# Patient Record
Sex: Male | Born: 1940 | Race: White | Hispanic: No | Marital: Married | Smoking: Former smoker
Health system: Southern US, Community
[De-identification: ages and names within clinical notes are randomized; demographics above are authoritative.]

## PROBLEM LIST (undated history)

## (undated) DIAGNOSIS — I219 Acute myocardial infarction, unspecified: Secondary | ICD-10-CM

## (undated) DIAGNOSIS — E119 Type 2 diabetes mellitus without complications: Secondary | ICD-10-CM

## (undated) DIAGNOSIS — J449 Chronic obstructive pulmonary disease, unspecified: Secondary | ICD-10-CM

## (undated) DIAGNOSIS — Z8719 Personal history of other diseases of the digestive system: Secondary | ICD-10-CM

## (undated) DIAGNOSIS — K219 Gastro-esophageal reflux disease without esophagitis: Secondary | ICD-10-CM

## (undated) DIAGNOSIS — I10 Essential (primary) hypertension: Secondary | ICD-10-CM

## (undated) DIAGNOSIS — I2510 Atherosclerotic heart disease of native coronary artery without angina pectoris: Secondary | ICD-10-CM

## (undated) DIAGNOSIS — I509 Heart failure, unspecified: Secondary | ICD-10-CM

## (undated) HISTORY — PX: CORONARY ANGIOPLASTY WITH STENT PLACEMENT: SHX49

## (undated) HISTORY — PX: HERNIA REPAIR: SHX51

---

## 2004-07-11 ENCOUNTER — Encounter: Payer: Self-pay | Admitting: Internal Medicine

## 2004-08-11 ENCOUNTER — Encounter: Payer: Self-pay | Admitting: Internal Medicine

## 2004-09-10 ENCOUNTER — Encounter: Payer: Self-pay | Admitting: Internal Medicine

## 2004-10-11 ENCOUNTER — Encounter: Payer: Self-pay | Admitting: Internal Medicine

## 2004-11-11 ENCOUNTER — Encounter: Payer: Self-pay | Admitting: Internal Medicine

## 2004-11-25 ENCOUNTER — Ambulatory Visit: Payer: Self-pay

## 2004-12-02 ENCOUNTER — Ambulatory Visit: Payer: Self-pay

## 2004-12-09 ENCOUNTER — Encounter: Payer: Self-pay | Admitting: Internal Medicine

## 2005-01-09 ENCOUNTER — Encounter: Payer: Self-pay | Admitting: Internal Medicine

## 2005-02-08 ENCOUNTER — Encounter: Payer: Self-pay | Admitting: Internal Medicine

## 2005-03-11 ENCOUNTER — Encounter: Payer: Self-pay | Admitting: Internal Medicine

## 2005-04-10 ENCOUNTER — Encounter: Payer: Self-pay | Admitting: Internal Medicine

## 2005-05-11 ENCOUNTER — Encounter: Payer: Self-pay | Admitting: Internal Medicine

## 2005-06-11 ENCOUNTER — Encounter: Payer: Self-pay | Admitting: Internal Medicine

## 2005-07-11 ENCOUNTER — Encounter: Payer: Self-pay | Admitting: Internal Medicine

## 2005-08-11 ENCOUNTER — Encounter: Payer: Self-pay | Admitting: Internal Medicine

## 2005-09-10 ENCOUNTER — Encounter: Payer: Self-pay | Admitting: Internal Medicine

## 2005-10-11 ENCOUNTER — Encounter: Payer: Self-pay | Admitting: Internal Medicine

## 2005-11-11 ENCOUNTER — Encounter: Payer: Self-pay | Admitting: Internal Medicine

## 2005-12-09 ENCOUNTER — Encounter: Payer: Self-pay | Admitting: Internal Medicine

## 2006-01-09 ENCOUNTER — Encounter: Payer: Self-pay | Admitting: Internal Medicine

## 2006-02-08 ENCOUNTER — Encounter: Payer: Self-pay | Admitting: Internal Medicine

## 2006-03-11 ENCOUNTER — Encounter: Payer: Self-pay | Admitting: Internal Medicine

## 2006-04-10 ENCOUNTER — Encounter: Payer: Self-pay | Admitting: Internal Medicine

## 2006-05-11 ENCOUNTER — Encounter: Payer: Self-pay | Admitting: Internal Medicine

## 2006-06-11 ENCOUNTER — Encounter: Payer: Self-pay | Admitting: Internal Medicine

## 2006-07-11 ENCOUNTER — Encounter: Payer: Self-pay | Admitting: Internal Medicine

## 2006-08-11 ENCOUNTER — Encounter: Payer: Self-pay | Admitting: Internal Medicine

## 2006-09-10 ENCOUNTER — Encounter: Payer: Self-pay | Admitting: Internal Medicine

## 2006-10-11 ENCOUNTER — Encounter: Payer: Self-pay | Admitting: Internal Medicine

## 2006-11-11 ENCOUNTER — Encounter: Payer: Self-pay | Admitting: Internal Medicine

## 2006-12-10 ENCOUNTER — Encounter: Payer: Self-pay | Admitting: Internal Medicine

## 2007-01-10 ENCOUNTER — Encounter: Payer: Self-pay | Admitting: Internal Medicine

## 2007-02-09 ENCOUNTER — Encounter: Payer: Self-pay | Admitting: Internal Medicine

## 2007-03-12 ENCOUNTER — Encounter: Payer: Self-pay | Admitting: Internal Medicine

## 2007-04-11 ENCOUNTER — Encounter: Payer: Self-pay | Admitting: Internal Medicine

## 2007-05-12 ENCOUNTER — Encounter: Payer: Self-pay | Admitting: Internal Medicine

## 2007-06-12 ENCOUNTER — Encounter: Payer: Self-pay | Admitting: Internal Medicine

## 2008-08-15 ENCOUNTER — Ambulatory Visit: Payer: Self-pay | Admitting: Internal Medicine

## 2011-03-26 ENCOUNTER — Ambulatory Visit: Payer: Self-pay | Admitting: Internal Medicine

## 2013-03-20 ENCOUNTER — Ambulatory Visit: Payer: Self-pay | Admitting: Specialist

## 2013-04-09 ENCOUNTER — Ambulatory Visit: Payer: Self-pay | Admitting: Specialist

## 2013-04-16 ENCOUNTER — Ambulatory Visit: Payer: Self-pay | Admitting: Specialist

## 2013-07-24 ENCOUNTER — Ambulatory Visit: Payer: Self-pay | Admitting: Urology

## 2013-10-29 ENCOUNTER — Ambulatory Visit: Payer: Self-pay | Admitting: Specialist

## 2014-05-09 ENCOUNTER — Ambulatory Visit: Payer: Self-pay | Admitting: Specialist

## 2015-01-24 ENCOUNTER — Other Ambulatory Visit: Payer: Self-pay | Admitting: Specialist

## 2015-01-24 DIAGNOSIS — R911 Solitary pulmonary nodule: Secondary | ICD-10-CM

## 2015-03-22 ENCOUNTER — Emergency Department: Payer: Medicare Other

## 2015-03-22 ENCOUNTER — Emergency Department
Admission: EM | Admit: 2015-03-22 | Discharge: 2015-03-22 | Disposition: A | Discharge status: Home / Self Care | Payer: Medicare Other | Attending: Emergency Medicine | Admitting: Emergency Medicine

## 2015-03-22 ENCOUNTER — Encounter: Payer: Self-pay | Admitting: Emergency Medicine

## 2015-03-22 DIAGNOSIS — E119 Type 2 diabetes mellitus without complications: Secondary | ICD-10-CM | POA: Diagnosis not present

## 2015-03-22 DIAGNOSIS — R079 Chest pain, unspecified: Secondary | ICD-10-CM | POA: Diagnosis present

## 2015-03-22 DIAGNOSIS — Z87891 Personal history of nicotine dependence: Secondary | ICD-10-CM | POA: Insufficient documentation

## 2015-03-22 HISTORY — DX: Atherosclerotic heart disease of native coronary artery without angina pectoris: I25.10

## 2015-03-22 HISTORY — DX: Chronic obstructive pulmonary disease, unspecified: J44.9

## 2015-03-22 HISTORY — DX: Type 2 diabetes mellitus without complications: E11.9

## 2015-03-22 HISTORY — DX: Acute myocardial infarction, unspecified: I21.9

## 2015-03-22 LAB — COMPREHENSIVE METABOLIC PANEL
ALBUMIN: 4.1 g/dL (ref 3.5–5.0)
ALK PHOS: 118 U/L (ref 38–126)
ALT: 20 U/L (ref 17–63)
ANION GAP: 12 (ref 5–15)
AST: 26 U/L (ref 15–41)
BUN: 21 mg/dL — ABNORMAL HIGH (ref 6–20)
CALCIUM: 9.2 mg/dL (ref 8.9–10.3)
CHLORIDE: 105 mmol/L (ref 101–111)
CO2: 26 mmol/L (ref 22–32)
CREATININE: 1.09 mg/dL (ref 0.61–1.24)
GFR calc non Af Amer: >60 mL/min (ref >60)
Glucose, Bld: 126 mg/dL — ABNORMAL HIGH (ref 65–99)
POTASSIUM: 3.4 mmol/L — AB (ref 3.5–5.1)
Sodium: 143 mmol/L (ref 135–145)
TOTAL PROTEIN: 7.5 g/dL (ref 6.5–8.1)
Total Bilirubin: 0.6 mg/dL (ref 0.3–1.2)

## 2015-03-22 LAB — CBC
HCT: 49 % (ref 40.0–52.0)
HEMOGLOBIN: 15.9 g/dL (ref 13.0–18.0)
MCH: 26.9 pg (ref 26.0–34.0)
MCHC: 32.4 g/dL (ref 32.0–36.0)
MCV: 82.8 fL (ref 80.0–100.0)
Platelets: 219 10*3/uL (ref 150–440)
RBC: 5.92 MIL/uL — AB (ref 4.40–5.90)
RDW: 14.6 % — AB (ref 11.5–14.5)
WBC: 17.6 10*3/uL — ABNORMAL HIGH (ref 3.8–10.6)

## 2015-03-22 LAB — TROPONIN I: Troponin I: <0.03 ng/mL (ref <0.031)

## 2015-03-22 LAB — FIBRIN DERIVATIVES D-DIMER (ARMC ONLY): FIBRIN DERIVATIVES D-DIMER (ARMC): 819 — AB (ref 0–499)

## 2015-03-22 MED ORDER — IOHEXOL 350 MG/ML SOLN
100.0000 mL | Freq: Once | INTRAVENOUS | Status: AC | PRN
  Administered 2015-03-22: 100 mL via INTRAVENOUS

## 2015-03-22 MED ORDER — ASPIRIN 81 MG PO CHEW: 324.0000 mg | CHEWABLE_TABLET | Freq: Once | ORAL | Status: DC

## 2015-03-22 MED ORDER — ASPIRIN 81 MG PO CHEW
CHEWABLE_TABLET | ORAL | Status: AC
  Filled 2015-03-22: qty 4

## 2015-03-22 NOTE — ED Notes (Signed)
Report to [employer], rn.

## 2015-03-22 NOTE — Consult Note (Signed)
[facility] - [place] [facility]   PATIENT NAME: Jose Ray    MR#:  [id number]  DATE OF BIRTH:  Nov 30, 1940  DATE OF ADMISSION:  03/22/2015  PRIMARY CARE PHYSICIAN: Dr Burnadette Pop  REQUESTING/REFERRING PHYSICIAN: Governor Rooks  CHIEF COMPLAINT:   Burning chest pain  HISTORY OF PRESENT ILLNESS:  Jose Ray  is a 74 y.o. male with a known history of coronary artery disease. He woke up at 2 AM felt like he had a real bad heartburn he took some comes and discomfort was still going on. He had a similar episode back in 2002 where he had a heart attack. He felt queasy started having dry heaves. They took his blood pressure was normal. He had no sweating initially but did have some sweating here in the ER. The pain was described as a burning pain 4 out of 10 intensity lasted about one hour he is pain-free now and feels okay. 10 days ago Dr. Gwen Pounds took him off his Plavix. He states that he has had disc decreased exercise capacity. Yesterday, so a loss of energy and the had to take a nap. He is not sweats slept well in the past 3 days.  In the ER he had a CT scan of the chest that was negative for pulmonary embolism first cardiac enzyme is negative.  PAST MEDICAL HISTORY:   Past Medical History  Diagnosis Date  . Diabetes mellitus without complication   . Coronary artery disease   . COPD (chronic obstructive pulmonary disease)   . Acute MI     PAST SURGICAL HISTOIRY:   Past Surgical History  Procedure Laterality Date  . Coronary angioplasty with stent placement    . Hernia repair N/A     SOCIAL HISTORY:   History  Substance Use Topics  . Smoking status: Former Games developer  . Smokeless tobacco: Never Used  . Alcohol Use: No    FAMILY HISTORY:   Family History  Problem Relation Age of Onset  . Stomach cancer Mother   . Coronary artery disease Father     DRUG ALLERGIES:   Allergies  Allergen Reactions  . Shrimp [Shellfish Allergy]  Anaphylaxis  . Penicillins Other (See Comments)    Patient reports feeling faint when given    REVIEW OF SYSTEMS:  CONSTITUTIONAL: No fever. Slight sweating and ER. Positive for weight loss. Positive for fatigue and decreased exercise capacity.  EYES: No blurred or double vision. Wears reading glasses EARS, NOSE, AND THROAT: No tinnitus or ear pain.  RESPIRATORY: No cough, shortness of breath, wheezing or hemoptysis.  CARDIOVASCULAR: Positive for burning chest pain, no orthopnea, edema.  GASTROINTESTINAL: No  abdominal pain. No blood in bowel movements. Positive for dry heaves. Has loose stools. GENITOURINARY: No dysuria, hematuria.  ENDOCRINE: No polyuria, nocturia,  HEMATOLOGY: No anemia, easy bruising or bleeding SKIN: No rash or lesion. MUSCULOSKELETAL: Positive for joint pains of sitting down for long period of time  NEUROLOGIC: No tingling, numbness, weakness. Syncopal episode back in January PSYCHIATRY: No anxiety or depression.   MEDICATIONS AT HOME:   Prior to Admission medications   Medication Sig Start Date End Date Taking? Authorizing Provider  aspirin 81 MG tablet Take 81 mg by mouth daily.   Yes Historical Provider, MD  atorvastatin (LIPITOR) 80 MG tablet Take 80 mg by mouth daily.   Yes Historical Provider, MD  benzonatate (TESSALON) 200 MG capsule Take 200 mg by mouth 3 (three) times daily as needed for cough.  Yes Historical Provider, MD  fexofenadine (ALLEGRA) 180 MG tablet Take 180 mg by mouth daily.   Yes Historical Provider, MD  fluticasone (VERAMYST) 27.5 MCG/SPRAY nasal spray Place 1 spray into the nose daily.   Yes Historical Provider, MD  Fluticasone-Salmeterol (ADVAIR) 250-50 MCG/DOSE AEPB Inhale 1 puff into the lungs 2 (two) times daily.   Yes Historical Provider, MD  hydrocortisone (ANUSOL-HC) 25 MG suppository Place 25 mg rectally 2 (two) times daily as needed for hemorrhoids or itching.   Yes Historical Provider, MD  losartan-hydrochlorothiazide (HYZAAR)  100-25 MG per tablet Take 1 tablet by mouth daily.   Yes Historical Provider, MD  metFORMIN (GLUCOPHAGE) 500 MG tablet Take by mouth 2 (two) times daily with a meal.   Yes Historical Provider, MD  metoprolol succinate (TOPROL-XL) 100 MG 24 hr tablet Take 50 mg by mouth daily. Take with or immediately following a meal.   Yes Historical Provider, MD  nitroGLYCERIN (NITROSTAT) 0.4 MG SL tablet Place 0.4 mg under the tongue every 5 (five) minutes as needed for chest pain.   Yes Historical Provider, MD  OXYGEN Inhale 2.5-3 L into the lungs continuous. Used while sleeping and during exercise   Yes Historical Provider, MD  pantoprazole (PROTONIX) 40 MG tablet Take 40 mg by mouth daily.   Yes Historical Provider, MD  Polyethyl Glycol-Propyl Glycol (SYSTANE) 0.4-0.3 % SOLN Apply 1 drop to eye 4 (four) times daily as needed (dry eyes).   Yes Historical Provider, MD  tamsulosin (FLOMAX) 0.4 MG CAPS capsule Take 0.4 mg by mouth.   Yes Historical Provider, MD  tiotropium (SPIRIVA) 18 MCG inhalation capsule Place 18 mcg into inhaler and inhale daily.   Yes Historical Provider, MD      VITAL SIGNS:  Blood pressure 160/90, pulse 89, temperature 97.4 F (36.3 C), temperature source Oral, resp. rate 16, height 5\' 11"  (1.803 m), weight 102.967 kg (227 lb), SpO2 98 %.  PHYSICAL EXAMINATION:  GENERAL:  74 y.o.-year-old patient lying in the bed with no acute distress.  EYES: Pupils equal, round, reactive to light and accommodation. No scleral icterus. Extraocular muscles intact.  HEENT: Head atraumatic, normocephalic. Oropharynx and nasopharynx clear.  NECK:  Supple, no jugular venous distention. No thyroid enlargement, no tenderness.  LUNGS: Normal breath sounds bilaterally, no wheezing, rales,rhonchi or crepitation. No use of accessory muscles of respiration.  CARDIOVASCULAR: S1, S2 normal. No murmurs, rubs, or gallops.  ABDOMEN: Soft, nontender, nondistended. Bowel sounds present. No organomegaly or mass.   EXTREMITIES: No pedal edema, cyanosis, or clubbing.  NEUROLOGIC: Cranial nerves II through XII are intact. Muscle strength 5/5 in all extremities. Sensation intact. Gait not checked.  PSYCHIATRIC: The patient is alert and oriented x 3.  SKIN: No obvious rash, lesion, or ulcer.   LABORATORY PANEL:   CBC  Recent Labs Lab 03/22/15 0327  WBC 17.6*  HGB 15.9  HCT 49.0  PLT 219    Chemistries   Recent Labs Lab 03/22/15 0327  NA 143  K 3.4*  CL 105  CO2 26  GLUCOSE 126*  BUN 21*  CREATININE 1.09  CALCIUM 9.2  AST 26  ALT 20  ALKPHOS 118  BILITOT 0.6    Cardiac Enzymes  Recent Labs Lab 03/22/15 0327  TROPONINI <0.03    RADIOLOGY:  Ct Angio Chest Pe W/cm &/or Wo Cm  03/22/2015   CLINICAL DATA:  Chest pain and shortness of Breath  EXAM: CT ANGIOGRAPHY CHEST WITH CONTRAST  TECHNIQUE: Multidetector CT imaging of the chest was  performed using the standard protocol during bolus administration of intravenous contrast. Multiplanar CT image reconstructions and MIPs were obtained to evaluate the vascular anatomy.  CONTRAST:  OMNIPAQUE IOHEXOL 350 MG/ML SOLN  COMPARISON:  05/09/2014  FINDINGS: The lungs are well aerated bilaterally but demonstrate diffuse emphysematous changes similar to that seen on the prior exam. Some acute on chronic changes are seen in the bases bilaterally. A left upper lobe nodule is again seen and enlarged by less than 1 mm measuring between 12 and 13 mm. The difference may be related to the thickness of the image slices.  The thoracic inlet is within normal limits. Aortic calcifications are seen without aneurysmal dilatation or dissection. The pulmonary artery is well visualized and demonstrates a normal branching pattern. No filling defects to suggest pulmonary emboli are seen. Moderate coronary calcifications are seen.  Scanning into the upper abdomen reveals no acute abnormality.  Review of the MIP images confirms the above findings.  IMPRESSION: No  evidence of pulmonary emboli.  Bibasilar changes likely representing acute on chronic atelectasis.  Relatively stable left upper lobe nodule. The difference in size may be related to the imaging technique.   Electronically Signed   By: Alcide Clever M.D.   On: 03/22/2015 08:34   Dg Chest Port 1 View  03/22/2015   CLINICAL DATA:  Chest pain, onset tonight.  EXAM: PORTABLE CHEST - 1 VIEW  COMPARISON:  CT 05/09/2014  FINDINGS: The left upper lobe pulmonary nodule is grossly unchanged from the 05/09/2014 CT. There are severe upper lobe emphysematous changes. There is basilar opacity bilaterally, left greater than right. Some of this basilar opacity appears to be chronic but there could be superimposed infectious infiltrate or atelectasis in the left base. There is no pneumothorax. No large effusion. Heart size is unremarkable and unchanged.  IMPRESSION: Severe emphysematous changes, with left base opacity which could represent superimposed infiltrate or atelectasis.  Known left upper lobe pulmonary nodule, due for a follow-up CT in July or August.   Electronically Signed   By: Ellery Plunk M.D.   On: 03/22/2015 04:01    EKG:   Normal sinus rhythm 94 bpm  IMPRESSION AND PLAN:   1. Atypical chest pain. History of coronary artery disease. Patient had a negative CAT scan here in the emergency room. Patient had a recent stress test as per cardiology note this was okay. I advised a second cardiac enzymes here in the emergency room and then discharge if that is negative. Follow-up with Dr. Gwen Pounds as outpatient. The patient's pain sounds more GI in nature patient is already on Protonix. The patient does have a hiatal hernia which could be contributing to the pain. 2. hyperlipidemia unspecified-continue atorvastatin. 3. essential hypertension -continue usual medications. 4. type 2 diabetes -continue usual medications. 5. Leukocytosis- likely secondary to dry heaving. Follow-up as outpatient.  All the  records are reviewed and case discussed with Consulting provider. Management plans discussed with the patient, family and they are in agreement.  CODE STATUS: Full  TOTAL TIME TAKING CARE OF THIS PATIENT: 55 minutes.    Alford Highland M.D on 03/22/2015 at 10:27 AM  Between 7am to 6pm - Pager - [phone]  After 6pm go to [email or web address] - password [email or web address] [facility]  [facility]  Office  [phone]  CC: Primary care Physician: Dr. Burnadette Pop

## 2015-03-22 NOTE — ED Notes (Signed)
MD at bedside. 

## 2015-03-22 NOTE — ED Provider Notes (Signed)
[facility] Emergency Department Provider Note  ____________________________________________  Time seen: On arrival, EMS  I have reviewed the triage vital signs and the nursing notes.   HISTORY  Chief Complaint Chest Pain      HPI Jose Ray is a 74 y.o. male who presents with chest pain. Patient reports he woke up approximately 1-2 hours ago with a burning sensation in his chest, he took some tums which did not help so his wife called EMS. He has a history of a heart attack several years ago which felt similar and required 2 stents. He has been on aspirin and Plavix but recently his physician just stopped Plavix 1 week ago. He reports travel to [place] one month ago but denies calf pain and denies shortness of breath although he wears oxygen at night for COPD. No cough, no fever, no chills.     Past Medical History  Diagnosis Date  . Diabetes mellitus without complication   . Coronary artery disease   . COPD (chronic obstructive pulmonary disease)   . Acute MI     There are no active problems to display for this patient.   Past Surgical History  Procedure Laterality Date  . Coronary angioplasty with stent placement      No current outpatient prescriptions on file.  Allergies Shrimp  History reviewed. No pertinent family history.  Social History History  Substance Use Topics  . Smoking status: Former Games developer  . Smokeless tobacco: Never Used  . Alcohol Use: No    Review of Systems  Constitutional: Negative for fever. Eyes: Negative for visual changes. ENT: Negative for sore throat Cardiovascular: Positive for chest pain Respiratory: Negative for shortness of breath. Gastrointestinal: Negative for abdominal pain, vomiting and diarrhea. Genitourinary: Negative for dysuria. Musculoskeletal: Negative for back pain. Skin: Negative for rash. Neurological: Negative for headaches or focal weakness   10-point ROS otherwise  negative.  ____________________________________________   PHYSICAL EXAM:  VITAL SIGNS: ED Triage Vitals  Enc Vitals Group     BP 03/22/15 0325 175/86 mmHg     Pulse Rate 03/22/15 0325 94     Resp 03/22/15 0325 22     Temp 03/22/15 0325 97.4 F (36.3 C)     Temp Source 03/22/15 0325 Oral     SpO2 03/22/15 0325 92 %     Weight 03/22/15 0325 227 lb (102.967 kg)     Height 03/22/15 0325 5\' 11"  (1.803 m)     Head Cir --      Peak Flow --      Pain Score 03/22/15 0328 2     Pain Loc --      Pain Edu? --      Excl. in GC? --      Constitutional: Alert and oriented. Well appearing and in no distress. Eyes: Conjunctivae are normal. PERRL. ENT   Head: Normocephalic and atraumatic.   Nose: No rhinnorhea.   Mouth/Throat: Mucous membranes are moist. Cardiovascular: Normal rate, regular rhythm. Normal and symmetric distal pulses are present in all extremities. No murmurs, rubs, or gallops. Respiratory: Normal respiratory effort without tachypnea nor retractions. Breath sounds are clear and equal bilaterally.  Gastrointestinal: Soft and non-tender in all quadrants. No distention. There is no CVA tenderness. Genitourinary: deferred Musculoskeletal: Nontender with normal range of motion in all extremities. No lower extremity tenderness nor edema. Neurologic:  Normal speech and language. No gross focal neurologic deficits are appreciated. Skin:  Skin is warm, dry and intact. No rash  noted. Psychiatric: Mood and affect are normal. Patient exhibits appropriate insight and judgment.  ____________________________________________    LABS (pertinent positives/negatives)  Labs Reviewed  CBC - Abnormal; Notable for the following:    WBC 17.6 (*)    RBC 5.92 (*)    RDW 14.6 (*)    All other components within normal limits  COMPREHENSIVE METABOLIC PANEL - Abnormal; Notable for the following:    Potassium 3.4 (*)    Glucose, Bld 126 (*)    BUN 21 (*)    All other components  within normal limits  FIBRIN DERIVATIVES D-DIMER ([facility] ONLY) - Abnormal; Notable for the following:    Fibrin derivatives D-dimer ([facility]) 819 (*)    All other components within normal limits  TROPONIN I    ____________________________________________   EKG  ED ECG REPORT I, Jene Every, the attending physician, personally viewed and interpreted this ECG.   Date: 03/22/2015  EKG Time: 3:24 AM  Rate: 91  Rhythm: normal sinus rhythm  Axis: Normal  Intervals:none  ST&T Change: Old anteroseptal infarct   ____________________________________________    RADIOLOGY  Chest x-ray with atelectasis versus pneumonia  ____________________________________________   PROCEDURES  Procedure(s) performed: none  Critical Care performed: none  ____________________________________________   INITIAL IMPRESSION / ASSESSMENT AND PLAN / ED COURSE  Pertinent labs & imaging results that were available during my care of the patient were reviewed by me and considered in my medical decision making (see chart for details).  Initial chest x-ray concerning for pneumonia versus atelectasis. Given high white count and elevated d-dimer I will obtain a CT chest to evaluate for both PE and possible pneumonia. Patient comfortable and pain-free at this time.  ____________________________________________ Patient signed out to Dr. Shaune Pollack, to follow-up on CT angiogram. Patient will likely require admission  FINAL CLINICAL IMPRESSION(S) / ED DIAGNOSES  Final diagnoses:  Chest pain, unspecified chest pain type     Jene Every, MD 03/22/15 [phone]

## 2015-03-22 NOTE — ED Notes (Signed)
Pt had 324mg  asa chewed with ems and 1 inch of ntg paste to left chest.

## 2015-03-22 NOTE — ED Notes (Signed)
Pt states awoke from sleep with mid sternal chest pressure and pain. Pt states" i thought is was heartburn so i took some tums and it didn't help so i called 911". Pt diaphoretic, nauseated. Pt with previous mi in 2002 pt states "this feels like the heart attack i had the last time."

## 2015-03-22 NOTE — ED Notes (Signed)
Patient took 81mg  ASA last night. Patient took 81mg  x2 this am at 02:00. Ems administered another 81mg  ASA en route. Discussed with ED MD. Verbal order to dc ASA.

## 2015-03-22 NOTE — ED Provider Notes (Addendum)
[facility]  I accepted care from Dr. Cyril Loosen  ____________________________________________    RADIOLOGY IMPRESSION: No evidence of pulmonary emboli.  Bibasilar changes likely representing acute on chronic atelectasis.  Relatively stable left upper lobe nodule. The difference in size may be related to the imaging technique.  ____________________________________________ Raina Mina, MD, the attending physician have personally viewed and interpreted this ECG.   80 bpm. Normal sinus rhythm. Normal axis. Q waves septally. Normal ST and T-wave.  PROCEDURES  Procedure(s) performed: None  Critical Care performed: None  ____________________________________________   INITIAL IMPRESSION / ASSESSMENT AND PLAN / ED COURSE  Pertinent labs & imaging results that were available during my care of the patient were reviewed by me and considered in my medical decision making (see chart for details).  Patient's CT is negative for PE or pneumonia. Unclear what his elevated white blood cell count of 17 is from. Regarding his chest pain, he has had blockages before and is high risk given that he was recently taken off his Plavix. I recommended observation admission due to chest pain. Currently he has no chest pain. he did get aspirin prior to arrival to a total of 324 mg over the preceding 12 hours.  I discussed with the hospitalist and the family for admission.  Patient is Medicare and family was questioned whether or not they would pay for full observation status which was confirmed with our care manager. Patient was seen by our hospitalist doctor Fonnie Birkenhead who discussed with the patient about sending a repeat troponin and if negative discharging home to follow up with his cardiologist on Monday. Patient did have a recent negative stress test in January and this is apparently the reason why he was taken off of his Plavix. Most likely source of his discomfort today was  probably GI related.  Richard Shaune Pascal was negative.  Discharge instructions and return precautions were discussed with patient and family and they're comfortable with this plan.  ____________________________________________   FINAL CLINICAL IMPRESSION(S) / ED DIAGNOSES  Final diagnoses:  Chest pain, unspecified chest pain type      Governor Rooks, MD 03/22/15 [id number]  Governor Rooks, MD 03/22/15 1019  Governor Rooks, MD 03/22/15 1309

## 2015-03-22 NOTE — ED Notes (Signed)
Pt updated on ct process. Pt denies pain at this time. ntg paste removed from chest due to blood pressure.

## 2015-03-22 NOTE — Discharge Instructions (Signed)
Return to the emergency room for any new or worsening chest pain, jaw pain, trouble breathing, shortness breath, weakness, numbness, nausea, sweating, or any other symptoms concerning to you.  Chest Pain (Nonspecific) It is often hard to give a specific diagnosis for the cause of chest pain. There is always a chance that your pain could be related to something serious, such as a heart attack or a blood clot in the lungs. You need to follow up with your health care provider for further evaluation. CAUSES   Heartburn.  Pneumonia or bronchitis.  Anxiety or stress.  Inflammation around your heart (pericarditis) or lung (pleuritis or pleurisy).  A blood clot in the lung.  A collapsed lung (pneumothorax). It can develop suddenly on its own (spontaneous pneumothorax) or from trauma to the chest.  Shingles infection (herpes zoster virus). The chest wall is composed of bones, muscles, and cartilage. Any of these can be the source of the pain.  The bones can be bruised by injury.  The muscles or cartilage can be strained by coughing or overwork.  The cartilage can be affected by inflammation and become sore (costochondritis). DIAGNOSIS  Lab tests or other studies may be needed to find the cause of your pain. Your health care provider may have you take a test called an ambulatory electrocardiogram (ECG). An ECG records your heartbeat patterns over a 24-hour period. You may also have other tests, such as:  Transthoracic echocardiogram (TTE). During echocardiography, sound waves are used to evaluate how blood flows through your heart.  Transesophageal echocardiogram (TEE).  Cardiac monitoring. This allows your health care provider to monitor your heart rate and rhythm in real time.  Holter monitor. This is a portable device that records your heartbeat and can help diagnose heart arrhythmias. It allows your health care provider to track your heart activity for several days, if needed.  Stress  tests by exercise or by giving medicine that makes the heart beat faster. TREATMENT   Treatment depends on what may be causing your chest pain. Treatment may include:  Acid blockers for heartburn.  Anti-inflammatory medicine.  Pain medicine for inflammatory conditions.  Antibiotics if an infection is present.  You may be advised to change lifestyle habits. This includes stopping smoking and avoiding alcohol, caffeine, and chocolate.  You may be advised to keep your head raised (elevated) when sleeping. This reduces the chance of acid going backward from your stomach into your esophagus. Most of the time, nonspecific chest pain will improve within 2-3 days with rest and mild pain medicine.  HOME CARE INSTRUCTIONS   If antibiotics were prescribed, take them as directed. Finish them even if you start to feel better.  For the next few days, avoid physical activities that bring on chest pain. Continue physical activities as directed.  Do not use any tobacco products, including cigarettes, chewing tobacco, or electronic cigarettes.  Avoid drinking alcohol.  Only take medicine as directed by your health care provider.  Follow your health care provider's suggestions for further testing if your chest pain does not go away.  Keep any follow-up appointments you made. If you do not go to an appointment, you could develop lasting (chronic) problems with pain. If there is any problem keeping an appointment, call to reschedule. SEEK MEDICAL CARE IF:   Your chest pain does not go away, even after treatment.  You have a rash with blisters on your chest.  You have a fever. SEEK IMMEDIATE MEDICAL CARE IF:   You have  increased chest pain or pain that spreads to your arm, neck, jaw, back, or abdomen.  You have shortness of breath.  You have an increasing cough, or you cough up blood.  You have severe back or abdominal pain.  You feel nauseous or vomit.  You have severe weakness.  You  faint.  You have chills. This is an emergency. Do not wait to see if the pain will go away. Get medical help at once. Call your local emergency services (911 in [place] Do not drive yourself to the hospital. MAKE SURE YOU:   Understand these instructions.  Will watch your condition.  Will get help right away if you are not doing well or get worse. Document Released: 07/07/2005 Document Revised: 10/02/2013 Document Reviewed: 05/02/2008 [facility] Patient Information 2015 [place], [place]. This information is not intended to replace advice given to you by your health care provider. Make sure you discuss any questions you have with your health care provider.

## 2015-04-25 ENCOUNTER — Ambulatory Visit: Payer: BLUE CROSS/BLUE SHIELD

## 2015-04-29 ENCOUNTER — Other Ambulatory Visit: Payer: BLUE CROSS/BLUE SHIELD

## 2016-10-24 ENCOUNTER — Inpatient Hospital Stay
Admission: EM | Admit: 2016-10-24 | Discharge: 2016-10-26 | DRG: 089 | Disposition: A | Discharge status: Home / Self Care | Payer: Medicare Other | Attending: Specialist | Admitting: Specialist

## 2016-10-24 ENCOUNTER — Emergency Department: Payer: Medicare Other

## 2016-10-24 DIAGNOSIS — Z880 Allergy status to penicillin: Secondary | ICD-10-CM | POA: Diagnosis not present

## 2016-10-24 DIAGNOSIS — I252 Old myocardial infarction: Secondary | ICD-10-CM | POA: Diagnosis not present

## 2016-10-24 DIAGNOSIS — Z87891 Personal history of nicotine dependence: Secondary | ICD-10-CM

## 2016-10-24 DIAGNOSIS — E785 Hyperlipidemia, unspecified: Secondary | ICD-10-CM | POA: Diagnosis present

## 2016-10-24 DIAGNOSIS — S060X1A Concussion with loss of consciousness of 30 minutes or less, initial encounter: Principal | ICD-10-CM | POA: Diagnosis present

## 2016-10-24 DIAGNOSIS — Z951 Presence of aortocoronary bypass graft: Secondary | ICD-10-CM

## 2016-10-24 DIAGNOSIS — J449 Chronic obstructive pulmonary disease, unspecified: Secondary | ICD-10-CM | POA: Diagnosis present

## 2016-10-24 DIAGNOSIS — I2510 Atherosclerotic heart disease of native coronary artery without angina pectoris: Secondary | ICD-10-CM | POA: Diagnosis present

## 2016-10-24 DIAGNOSIS — R55 Syncope and collapse: Secondary | ICD-10-CM | POA: Diagnosis not present

## 2016-10-24 DIAGNOSIS — N400 Enlarged prostate without lower urinary tract symptoms: Secondary | ICD-10-CM | POA: Diagnosis present

## 2016-10-24 DIAGNOSIS — I10 Essential (primary) hypertension: Secondary | ICD-10-CM | POA: Diagnosis present

## 2016-10-24 DIAGNOSIS — S0083XA Contusion of other part of head, initial encounter: Secondary | ICD-10-CM | POA: Diagnosis present

## 2016-10-24 DIAGNOSIS — I5022 Chronic systolic (congestive) heart failure: Secondary | ICD-10-CM | POA: Diagnosis present

## 2016-10-24 DIAGNOSIS — Z91013 Allergy to seafood: Secondary | ICD-10-CM | POA: Diagnosis not present

## 2016-10-24 DIAGNOSIS — Z955 Presence of coronary angioplasty implant and graft: Secondary | ICD-10-CM

## 2016-10-24 DIAGNOSIS — W19XXXA Unspecified fall, initial encounter: Secondary | ICD-10-CM | POA: Diagnosis present

## 2016-10-24 DIAGNOSIS — I110 Hypertensive heart disease with heart failure: Secondary | ICD-10-CM | POA: Diagnosis present

## 2016-10-24 DIAGNOSIS — E1151 Type 2 diabetes mellitus with diabetic peripheral angiopathy without gangrene: Secondary | ICD-10-CM | POA: Diagnosis present

## 2016-10-24 DIAGNOSIS — Z79899 Other long term (current) drug therapy: Secondary | ICD-10-CM | POA: Diagnosis not present

## 2016-10-24 DIAGNOSIS — E119 Type 2 diabetes mellitus without complications: Secondary | ICD-10-CM

## 2016-10-24 DIAGNOSIS — I5020 Unspecified systolic (congestive) heart failure: Secondary | ICD-10-CM

## 2016-10-24 LAB — CBC
HCT: 45.3 % (ref 40.0–52.0)
Hemoglobin: 15.5 g/dL (ref 13.0–18.0)
MCH: 28 pg (ref 26.0–34.0)
MCHC: 34.2 g/dL (ref 32.0–36.0)
MCV: 81.9 fL (ref 80.0–100.0)
PLATELETS: 199 10*3/uL (ref 150–440)
RBC: 5.53 MIL/uL (ref 4.40–5.90)
RDW: 15 % — AB (ref 11.5–14.5)
WBC: 12.6 10*3/uL — AB (ref 3.8–10.6)

## 2016-10-24 LAB — URINALYSIS, COMPLETE (UACMP) WITH MICROSCOPIC
Bacteria, UA: NONE SEEN
Bilirubin Urine: NEGATIVE
GLUCOSE, UA: NEGATIVE mg/dL
Hgb urine dipstick: NEGATIVE
KETONES UR: NEGATIVE mg/dL
Leukocytes, UA: NEGATIVE
NITRITE: NEGATIVE
PH: 8 (ref 5.0–8.0)
PROTEIN: NEGATIVE mg/dL
Specific Gravity, Urine: 1.01 (ref 1.005–1.030)
Squamous Epithelial / LPF: NONE SEEN
WBC, UA: NONE SEEN WBC/hpf (ref 0–5)

## 2016-10-24 LAB — BASIC METABOLIC PANEL
Anion gap: 9 (ref 5–15)
BUN: 19 mg/dL (ref 6–20)
CALCIUM: 9.5 mg/dL (ref 8.9–10.3)
CHLORIDE: 101 mmol/L (ref 101–111)
CO2: 26 mmol/L (ref 22–32)
CREATININE: 0.84 mg/dL (ref 0.61–1.24)
Glucose, Bld: 168 mg/dL — ABNORMAL HIGH (ref 65–99)
Potassium: 3.9 mmol/L (ref 3.5–5.1)
SODIUM: 136 mmol/L (ref 135–145)

## 2016-10-24 LAB — GLUCOSE, CAPILLARY: Glucose-Capillary: 149 mg/dL — ABNORMAL HIGH (ref 65–99)

## 2016-10-24 LAB — TROPONIN I: Troponin I: <0.03 ng/mL (ref <0.03)

## 2016-10-24 MED ORDER — PANTOPRAZOLE SODIUM 40 MG PO TBEC
40.0000 mg | DELAYED_RELEASE_TABLET | Freq: Every day | ORAL | Status: DC
  Administered 2016-10-25 – 2016-10-26 (×2): 40 mg via ORAL
  Filled 2016-10-24 (×2): qty 1

## 2016-10-24 MED ORDER — ONDANSETRON HCL 4 MG/2ML IJ SOLN: 4.0000 mg | Freq: Four times a day (QID) | INTRAMUSCULAR | Status: DC | PRN

## 2016-10-24 MED ORDER — MOMETASONE FURO-FORMOTEROL FUM 200-5 MCG/ACT IN AERO
2.0000 | INHALATION_SPRAY | Freq: Two times a day (BID) | RESPIRATORY_TRACT | Status: DC
  Administered 2016-10-25 – 2016-10-26 (×4): 2 via RESPIRATORY_TRACT
  Filled 2016-10-24: qty 8.8

## 2016-10-24 MED ORDER — TAMSULOSIN HCL 0.4 MG PO CAPS
0.4000 mg | ORAL_CAPSULE | Freq: Every day | ORAL | Status: DC
  Filled 2016-10-24: qty 1

## 2016-10-24 MED ORDER — ONDANSETRON HCL 4 MG PO TABS: 4.0000 mg | ORAL_TABLET | Freq: Four times a day (QID) | ORAL | Status: DC | PRN

## 2016-10-24 MED ORDER — ASPIRIN EC 81 MG PO TBEC
81.0000 mg | DELAYED_RELEASE_TABLET | Freq: Every day | ORAL | Status: DC
  Administered 2016-10-25 – 2016-10-26 (×2): 81 mg via ORAL
  Filled 2016-10-24 (×2): qty 1

## 2016-10-24 MED ORDER — ATORVASTATIN CALCIUM 20 MG PO TABS
80.0000 mg | ORAL_TABLET | Freq: Every day | ORAL | Status: DC
  Administered 2016-10-25 – 2016-10-26 (×2): 80 mg via ORAL
  Filled 2016-10-24 (×2): qty 4

## 2016-10-24 MED ORDER — INSULIN ASPART 100 UNIT/ML ~~LOC~~ SOLN: 0.0000 [IU] | Freq: Every day | SUBCUTANEOUS | Status: DC

## 2016-10-24 MED ORDER — ENOXAPARIN SODIUM 40 MG/0.4ML ~~LOC~~ SOLN
40.0000 mg | SUBCUTANEOUS | Status: DC
  Administered 2016-10-25: 40 mg via SUBCUTANEOUS
  Filled 2016-10-24: qty 0.4

## 2016-10-24 MED ORDER — METOPROLOL SUCCINATE ER 50 MG PO TB24
50.0000 mg | ORAL_TABLET | Freq: Every day | ORAL | Status: DC
  Administered 2016-10-25 – 2016-10-26 (×2): 50 mg via ORAL
  Filled 2016-10-24 (×2): qty 1

## 2016-10-24 MED ORDER — ACETAMINOPHEN 325 MG PO TABS: 650.0000 mg | ORAL_TABLET | Freq: Four times a day (QID) | ORAL | Status: DC | PRN

## 2016-10-24 MED ORDER — INSULIN ASPART 100 UNIT/ML ~~LOC~~ SOLN
0.0000 [IU] | Freq: Three times a day (TID) | SUBCUTANEOUS | Status: DC
  Administered 2016-10-25: 1 [IU] via SUBCUTANEOUS
  Administered 2016-10-25: 2 [IU] via SUBCUTANEOUS
  Administered 2016-10-25 – 2016-10-26 (×3): 1 [IU] via SUBCUTANEOUS
  Filled 2016-10-24 (×3): qty 1
  Filled 2016-10-24: qty 2
  Filled 2016-10-24: qty 1

## 2016-10-24 MED ORDER — ACETAMINOPHEN 650 MG RE SUPP: 650.0000 mg | Freq: Four times a day (QID) | RECTAL | Status: DC | PRN

## 2016-10-24 MED ORDER — TIOTROPIUM BROMIDE MONOHYDRATE 18 MCG IN CAPS
18.0000 ug | ORAL_CAPSULE | Freq: Every day | RESPIRATORY_TRACT | Status: DC
  Administered 2016-10-25 – 2016-10-26 (×2): 18 ug via RESPIRATORY_TRACT
  Filled 2016-10-24: qty 5

## 2016-10-24 MED ORDER — LOSARTAN POTASSIUM-HCTZ 100-25 MG PO TABS: 1.0000 | ORAL_TABLET | Freq: Every day | ORAL | Status: DC

## 2016-10-24 NOTE — ED Triage Notes (Addendum)
Pt arrives to ER via POV c/o swelling and injury to left side of face after syncopal episode while at church. Pt left eye swollen shut, blackened. Pt denies use of blood thinners.   Pt denies neck pain. Pt alert and oriented X4, active, cooperative, pt in NAD. RR even and unlabored, color WNL.

## 2016-10-24 NOTE — ED Notes (Signed)
Floor nurse unable to get report at this time, ED nurse will call back in a few minutes.

## 2016-10-24 NOTE — ED Provider Notes (Signed)
[facility] Emergency Department Provider Note        Time seen: ----------------------------------------- 9:04 PM on 10/24/2016 -----------------------------------------    I have reviewed the triage vital signs and the nursing notes.   HISTORY  Chief Complaint Loss of Consciousness; Fall; and Eye Injury    HPI Jose Ray is a 76 y.o. male who presents to the ER being brought by private vehicle after a syncopal episode while at church today. Patient struck the left side of his head and faceand fell forward during the event. Patient does not remember anything for about an hour. After the fall and head injury. Family states he looked alert after a time. But has no recollection of a broad spent a time. He denies any headaches, dizziness, nausea or other complaints. He has had syncopal events in the past but no specific etiology was found.   Past Medical History:  Diagnosis Date  . Acute MI   . COPD (chronic obstructive pulmonary disease) (HCC)   . Coronary artery disease   . Diabetes mellitus without complication (HCC)     There are no active problems to display for this patient.   Past Surgical History:  Procedure Laterality Date  . CORONARY ANGIOPLASTY WITH STENT PLACEMENT    . HERNIA REPAIR N/A     Allergies Shrimp [shellfish allergy] and Penicillins  Social History Social History  Substance Use Topics  . Smoking status: Former Games developermoker  . Smokeless tobacco: Never Used  . Alcohol use No    Review of Systems Constitutional: Negative for fever. Cardiovascular: Negative for chest pain. Respiratory: Negative for shortness of breath. Gastrointestinal: Negative for abdominal pain, vomiting and diarrhea. Genitourinary: Negative for dysuria. Musculoskeletal: Negative for back pain. Skin: Positive for contusion Neurological: Negative for headaches, focal weakness or numbness.  10-point ROS otherwise  negative.  ____________________________________________   PHYSICAL EXAM:  VITAL SIGNS: ED Triage Vitals  Enc Vitals Group     BP 10/24/16 1733 (!) 148/67     Pulse Rate 10/24/16 1733 85     Resp 10/24/16 1733 16     Temp 10/24/16 1733 97.6 F (36.4 C)     Temp Source 10/24/16 1733 Oral     SpO2 10/24/16 1733 92 %     Weight 10/24/16 1733 220 lb (99.8 kg)     Height 10/24/16 1733 5\' 11"  (1.803 m)     Head Circumference --      Peak Flow --      Pain Score 10/24/16 1735 2     Pain Loc --      Pain Edu? --      Excl. in GC? --     Constitutional: Alert and oriented. No distress Eyes: Conjunctivae are normal. PERRL. Normal extraocular movements. Visual acuity appears normal once skin surrounding the left eye is retracted ENT   Head: Normocephalic, large left periorbital ecchymosis and swelling   Nose: No congestion/rhinnorhea.   Mouth/Throat: Mucous membranes are moist.   Neck: No stridor. Cardiovascular: Normal rate, regular rhythm. No murmurs, rubs, or gallops. Respiratory: Normal respiratory effort without tachypnea nor retractions. Breath sounds are clear and equal bilaterally. No wheezes/rales/rhonchi. Gastrointestinal: Soft and nontender. Normal bowel sounds Musculoskeletal: Nontender with normal range of motion in all extremities. No lower extremity tenderness nor edema. Neurologic:  Normal speech and language. No gross focal neurologic deficits are appreciated.  Skin:  Skin is warm, dry and intact. No rash noted. Psychiatric: Mood and affect are normal. Speech and behavior are  normal.  ____________________________________________  EKG: Interpreted by me. Sinus rhythm rate 80 bpm, first degree AV block, normal QRS size, normal QT, possible septal infarct age indeterminate  ____________________________________________  ED COURSE:  Pertinent labs & imaging results that were available during my care of the patient were reviewed by me and considered in my  medical decision making (see chart for details). Clinical Course   Patient presents to the ER after a true syncopal event and likely concussion. We will assess with labs and imaging.  Procedures ____________________________________________   LABS (pertinent positives/negatives)  Labs Reviewed  BASIC METABOLIC PANEL - Abnormal; Notable for the following:       Result Value   Glucose, Bld 168 (*)    All other components within normal limits  CBC - Abnormal; Notable for the following:    WBC 12.6 (*)    RDW 15.0 (*)    All other components within normal limits  URINALYSIS, COMPLETE (UACMP) WITH MICROSCOPIC - Abnormal; Notable for the following:    Color, Urine YELLOW (*)    APPearance CLEAR (*)    All other components within normal limits  GLUCOSE, CAPILLARY - Abnormal; Notable for the following:    Glucose-Capillary 149 (*)    All other components within normal limits  CBG MONITORING, ED    RADIOLOGY Images were viewed by me-CT head/maxillofacial IMPRESSION: 1. No definite evidence for acute intracranial abnormality. Mild periventricular white matter hypodensity consistent with small vessel disease 2. No CT evidence for acute facial bone fracture. Large left periorbital soft tissue hematoma.  ____________________________________________  FINAL ASSESSMENT AND PLAN  Syncope, concussion  Plan: Patient with labs and imaging as dictated above. Patient with a profound syncopal event as evidenced by his large facial hematoma and facial trauma. CT scans were unremarkable for any bony abnormality. He did have a period where he had significant concussive symptoms and does not remember a long period of time. I will discuss this with the hospitalist for observation in the hospital.   Emily Filbert, MD   Note: This note was generated in part or whole with voice recognition software. Voice recognition is usually quite accurate but there are transcription errors that can and  very often do occur. I apologize for any typographical errors that were not detected and corrected.     Emily Filbert, MD 10/24/16 2139

## 2016-10-24 NOTE — H&P (Signed)
[facility] [place] [facility]   PATIENT NAME: Jose Ray    MR#:  [id number]  DATE OF BIRTH:  Sep 03, 1941  DATE OF ADMISSION:  10/24/2016  PRIMARY CARE PHYSICIAN: Pcp Not In System   REQUESTING/REFERRING PHYSICIAN: Mayford KnifeWilliams, MD  CHIEF COMPLAINT:   Chief Complaint  Patient presents with  . Loss of Consciousness  . Fall  . Eye Injury    HISTORY OF PRESENT ILLNESS:  Jose Ray  is a 76 y.o. male who presents with Syncopal event. Patient has had syncopal events in the past, he and his wife stated that he has had about 3 or 4 prior syncopal events over the past 5 years. He has had these worked up by cardiology and pulmonology extensively, with no true indication as to why they're happening. Patient and his wife state that his syncopal events the past have been somewhat short-lived, with somewhat of a prodrome where he could use himself down to the ground rather than falling. Tonight was different, and that he fell straight forward and hit his face which has since resulted in a significant hematoma over his left eye. His wife states that he was unconscious this time for probably close to 10 minutes, which is longer than he has previously remained unconscious during his syncopal events. After he woke up this time he was able to speak with EMTs and seemed to be oriented, however afterwards at home he did not have any recollection of anything during that time period. Here in the ED his workup was largely negative upfront, but given his history hospitalists were called for admission and further evaluation.  PAST MEDICAL HISTORY:   Past Medical History:  Diagnosis Date  . Acute MI   . COPD (chronic obstructive pulmonary disease) (HCC)   . Coronary artery disease   . Diabetes mellitus without complication (HCC)     PAST SURGICAL HISTORY:   Past Surgical History:  Procedure Laterality Date  . CORONARY ANGIOPLASTY WITH STENT PLACEMENT    . HERNIA REPAIR N/A      SOCIAL HISTORY:   Social History  Substance Use Topics  . Smoking status: Former Games developermoker  . Smokeless tobacco: Never Used  . Alcohol use No    FAMILY HISTORY:   Family History  Problem Relation Age of Onset  . Stomach cancer Mother   . Coronary artery disease Father     DRUG ALLERGIES:   Allergies  Allergen Reactions  . Shrimp [Shellfish Allergy] Anaphylaxis  . Penicillins Other (See Comments)    Patient reports feeling faint when given    MEDICATIONS AT HOME:   Prior to Admission medications   Medication Sig Start Date End Date Taking? Authorizing Provider  aspirin 81 MG tablet Take 81 mg by mouth daily.    Historical Provider, MD  atorvastatin (LIPITOR) 80 MG tablet Take 80 mg by mouth daily.    Historical Provider, MD  benzonatate (TESSALON) 200 MG capsule Take 200 mg by mouth 3 (three) times daily as needed for cough.    Historical Provider, MD  fexofenadine (ALLEGRA) 180 MG tablet Take 180 mg by mouth daily.    Historical Provider, MD  fluticasone (VERAMYST) 27.5 MCG/SPRAY nasal spray Place 1 spray into the nose daily.    Historical Provider, MD  Fluticasone-Salmeterol (ADVAIR) 250-50 MCG/DOSE AEPB Inhale 1 puff into the lungs 2 (two) times daily.    Historical Provider, MD  hydrocortisone (ANUSOL-HC) 25 MG suppository Place 25 mg rectally 2 (two) times daily as  needed for hemorrhoids or itching.    Historical Provider, MD  losartan-hydrochlorothiazide (HYZAAR) 100-25 MG per tablet Take 1 tablet by mouth daily.    Historical Provider, MD  metFORMIN (GLUCOPHAGE) 500 MG tablet Take by mouth 2 (two) times daily with a meal.    Historical Provider, MD  metoprolol succinate (TOPROL-XL) 100 MG 24 hr tablet Take 50 mg by mouth daily. Take with or immediately following a meal.    Historical Provider, MD  nitroGLYCERIN (NITROSTAT) 0.4 MG SL tablet Place 0.4 mg under the tongue every 5 (five) minutes as needed for chest pain.    Historical Provider, MD  OXYGEN Inhale 2.5-3 L  into the lungs continuous. Used while sleeping and during exercise    Historical Provider, MD  pantoprazole (PROTONIX) 40 MG tablet Take 40 mg by mouth daily.    Historical Provider, MD  Polyethyl Glycol-Propyl Glycol (SYSTANE) 0.4-0.3 % SOLN Apply 1 drop to eye 4 (four) times daily as needed (dry eyes).    Historical Provider, MD  tamsulosin (FLOMAX) 0.4 MG CAPS capsule Take 0.4 mg by mouth.    Historical Provider, MD  tiotropium (SPIRIVA) 18 MCG inhalation capsule Place 18 mcg into inhaler and inhale daily.    Historical Provider, MD    REVIEW OF SYSTEMS:  Review of Systems  Constitutional: Negative for chills, fever, malaise/fatigue and weight loss.  HENT: Negative for ear pain, hearing loss and tinnitus.        Left periorbital hematoma  Eyes: Negative for blurred vision, double vision, pain and redness.  Respiratory: Negative for cough, hemoptysis and shortness of breath.   Cardiovascular: Negative for chest pain, palpitations, orthopnea and leg swelling.  Gastrointestinal: Negative for abdominal pain, constipation, diarrhea, nausea and vomiting.  Genitourinary: Negative for dysuria, frequency and hematuria.  Musculoskeletal: Negative for back pain, joint pain and neck pain.  Skin:       No acne, rash, or lesions  Neurological: Positive for loss of consciousness. Negative for dizziness, tremors, focal weakness and weakness.  Endo/Heme/Allergies: Negative for polydipsia. Does not bruise/bleed easily.  Psychiatric/Behavioral: Negative for depression. The patient is not nervous/anxious and does not have insomnia.      VITAL SIGNS:   Vitals:   10/24/16 1733 10/24/16 2105  BP: (!) 148/67 (!) 144/93  Pulse: 85 74  Resp: 16 17  Temp: 97.6 F (36.4 C)   TempSrc: Oral   SpO2: 92% 94%  Weight: 99.8 kg (220 lb)   Height: 5\' 11"  (1.803 m)    Wt Readings from Last 3 Encounters:  10/24/16 99.8 kg (220 lb)  03/22/15 103 kg (227 lb)    PHYSICAL EXAMINATION:  Physical Exam  Vitals  reviewed. Constitutional: He is oriented to person, place, and time. He appears well-developed and well-nourished. No distress.  HENT:  Head: Normocephalic.  Mouth/Throat: Oropharynx is clear and moist.  Large left periorbital hematoma  Eyes: Conjunctivae and EOM are normal. Pupils are equal, round, and reactive to light. No scleral icterus.  Neck: Normal range of motion. Neck supple. No JVD present. No thyromegaly present.  Cardiovascular: Normal rate, regular rhythm and intact distal pulses.  Exam reveals no gallop and no friction rub.   No murmur heard. Respiratory: Effort normal and breath sounds normal. No respiratory distress. He has no wheezes. He has no rales.  GI: Soft. Bowel sounds are normal. He exhibits no distension. There is no tenderness.  Musculoskeletal: Normal range of motion. He exhibits no edema.  No arthritis, no gout  Lymphadenopathy:  He has no cervical adenopathy.  Neurological: He is alert and oriented to person, place, and time. No cranial nerve deficit.  No dysarthria, no aphasia  Skin: Skin is warm and dry. No rash noted. No erythema.  Psychiatric: He has a normal mood and affect. His behavior is normal. Judgment and thought content normal.    LABORATORY PANEL:   CBC  Recent Labs Lab 10/24/16 1738  WBC 12.6*  HGB 15.5  HCT 45.3  PLT 199   ------------------------------------------------------------------------------------------------------------------  Chemistries   Recent Labs Lab 10/24/16 1738  NA 136  K 3.9  CL 101  CO2 26  GLUCOSE 168*  BUN 19  CREATININE 0.84  CALCIUM 9.5   ------------------------------------------------------------------------------------------------------------------  Cardiac Enzymes No results for input(s): TROPONINI in the last 168 hours. ------------------------------------------------------------------------------------------------------------------  RADIOLOGY:  Ct Head Wo Contrast  Result Date:  10/24/2016 CLINICAL DATA:  Passed out at church and landed on his left eye EXAM: CT HEAD WITHOUT CONTRAST CT MAXILLOFACIAL WITHOUT CONTRAST TECHNIQUE: Multidetector CT imaging of the head and maxillofacial structures were performed using the standard protocol without intravenous contrast. Multiplanar CT image reconstructions of the maxillofacial structures were also generated. COMPARISON:  None. FINDINGS: CT HEAD FINDINGS Brain: No acute territorial infarction, intracranial hemorrhage or focal mass lesion is visualized. The ventricles are nonenlarged. Mild periventricular white matter hypodensity consistent with small vessel change. Mild atrophy. Vascular: No hyperdense vessels. Scattered calcifications in the carotid siphons. Skull: Mastoid air cells are clear.  No depressed skull fracture. Other: Large left periorbital hematoma. CT MAXILLOFACIAL FINDINGS Osseous: Bilateral zygomatic arches and pterygoid plates appear intact. Mandibular heads are normally position. Left TMJ disease. No mandibular fracture. The nasal bones appear intact. Orbits: Large periorbital hematoma. Globe is grossly intact. There is no intra or extraconal soft tissue abnormality. Extraocular muscles are symmetric. There is no orbital fracture identified. Sinuses: No acute fluid levels. No sinus wall fracture. Minimal mucosal thickening in the ethmoid sinuses. Soft tissues: Large periorbital soft tissue hematoma on the left. IMPRESSION: 1. No definite evidence for acute intracranial abnormality. Mild periventricular white matter hypodensity consistent with small vessel disease 2. No CT evidence for acute facial bone fracture. Large left periorbital soft tissue hematoma. Electronically Signed   By: Jasmine Pang M.D.   On: 10/24/2016 18:38   Ct Maxillofacial Wo Contrast  Result Date: 10/24/2016 CLINICAL DATA:  Passed out at church and landed on his left eye EXAM: CT HEAD WITHOUT CONTRAST CT MAXILLOFACIAL WITHOUT CONTRAST TECHNIQUE:  Multidetector CT imaging of the head and maxillofacial structures were performed using the standard protocol without intravenous contrast. Multiplanar CT image reconstructions of the maxillofacial structures were also generated. COMPARISON:  None. FINDINGS: CT HEAD FINDINGS Brain: No acute territorial infarction, intracranial hemorrhage or focal mass lesion is visualized. The ventricles are nonenlarged. Mild periventricular white matter hypodensity consistent with small vessel change. Mild atrophy. Vascular: No hyperdense vessels. Scattered calcifications in the carotid siphons. Skull: Mastoid air cells are clear.  No depressed skull fracture. Other: Large left periorbital hematoma. CT MAXILLOFACIAL FINDINGS Osseous: Bilateral zygomatic arches and pterygoid plates appear intact. Mandibular heads are normally position. Left TMJ disease. No mandibular fracture. The nasal bones appear intact. Orbits: Large periorbital hematoma. Globe is grossly intact. There is no intra or extraconal soft tissue abnormality. Extraocular muscles are symmetric. There is no orbital fracture identified. Sinuses: No acute fluid levels. No sinus wall fracture. Minimal mucosal thickening in the ethmoid sinuses. Soft tissues: Large periorbital soft tissue hematoma on the left. IMPRESSION: 1. No definite  evidence for acute intracranial abnormality. Mild periventricular white matter hypodensity consistent with small vessel disease 2. No CT evidence for acute facial bone fracture. Large left periorbital soft tissue hematoma. Electronically Signed   By: Jasmine Pang M.D.   On: 10/24/2016 18:38    EKG:   Orders placed or performed during the hospital encounter of 10/24/16  . ED EKG  . ED EKG  . EKG 12-Lead  . EKG 12-Lead    IMPRESSION AND PLAN:  Principal Problem:   Syncope and collapse - Unclear etiology, has had prior extensive cardiac workup without true indication as to why he has these episodes. These episodes seem to be worse  and more prolonged than in the past, with the difference of the fact that he had some difficulty remembering events. We will admit him, trend his cardiac enzymes, get an echocardiogram and cardiology consultation morning. We will also order an EEG and get a neurology consult. Active Problems:   Diabetes (HCC) - sliding scale insulin with corresponding glucose checks   CAD (coronary artery disease) - continue home meds   HTN (hypertension) - stable, continue home meds  All the records are reviewed and case discussed with ED provider. Management plans discussed with the patient and/or family.  DVT PROPHYLAXIS: SubQ lovenox  GI PROPHYLAXIS: None  ADMISSION STATUS: Inpatient  CODE STATUS: Full Code Status History    This patient does not have a recorded code status. Please follow your organizational policy for patients in this situation.      TOTAL TIME TAKING CARE OF THIS PATIENT: 40 minutes.    Shonda Mandarino FIELDING 10/24/2016, 10:23 PM  [facility]  Office  [phone]  CC: Primary care physician; Pcp Not In System

## 2016-10-25 ENCOUNTER — Inpatient Hospital Stay: Payer: Medicare Other

## 2016-10-25 ENCOUNTER — Inpatient Hospital Stay
Admit: 2016-10-25 | Discharge: 2016-10-25 | Disposition: A | Discharge status: Home / Self Care | Payer: Medicare Other | Attending: Internal Medicine | Admitting: Internal Medicine

## 2016-10-25 DIAGNOSIS — R55 Syncope and collapse: Secondary | ICD-10-CM

## 2016-10-25 LAB — PHOSPHORUS: Phosphorus: 3.3 mg/dL (ref 2.5–4.6)

## 2016-10-25 LAB — CBC
HCT: 44.7 % (ref 40.0–52.0)
Hemoglobin: 15.1 g/dL (ref 13.0–18.0)
MCH: 27.4 pg (ref 26.0–34.0)
MCHC: 33.7 g/dL (ref 32.0–36.0)
MCV: 81.5 fL (ref 80.0–100.0)
Platelets: 198 K/uL (ref 150–440)
RBC: 5.48 MIL/uL (ref 4.40–5.90)
RDW: 14.8 % — ABNORMAL HIGH (ref 11.5–14.5)
WBC: 11.2 K/uL — ABNORMAL HIGH (ref 3.8–10.6)

## 2016-10-25 LAB — BASIC METABOLIC PANEL WITH GFR
Anion gap: 9 (ref 5–15)
BUN: 20 mg/dL (ref 6–20)
CO2: 28 mmol/L (ref 22–32)
Calcium: 9.4 mg/dL (ref 8.9–10.3)
Chloride: 103 mmol/L (ref 101–111)
Creatinine, Ser: 0.95 mg/dL (ref 0.61–1.24)
GFR calc Af Amer: >60 mL/min
GFR calc non Af Amer: >60 mL/min
Glucose, Bld: 185 mg/dL — ABNORMAL HIGH (ref 65–99)
Potassium: 3.6 mmol/L (ref 3.5–5.1)
Sodium: 140 mmol/L (ref 135–145)

## 2016-10-25 LAB — ECHOCARDIOGRAM COMPLETE
Height: 71 in
Weight: 3520 [oz_av]

## 2016-10-25 LAB — GLUCOSE, CAPILLARY
GLUCOSE-CAPILLARY: 128 mg/dL — AB (ref 65–99)
Glucose-Capillary: 123 mg/dL — ABNORMAL HIGH (ref 65–99)
Glucose-Capillary: 124 mg/dL — ABNORMAL HIGH (ref 65–99)
Glucose-Capillary: 128 mg/dL — ABNORMAL HIGH (ref 65–99)
Glucose-Capillary: 159 mg/dL — ABNORMAL HIGH (ref 65–99)
Glucose-Capillary: 163 mg/dL — ABNORMAL HIGH (ref 65–99)

## 2016-10-25 LAB — TROPONIN I
Troponin I: <0.03 ng/mL (ref <0.03)
Troponin I: <0.03 ng/mL
Troponin I: <0.03 ng/mL (ref <0.03)

## 2016-10-25 LAB — MAGNESIUM: MAGNESIUM: 1.9 mg/dL (ref 1.7–2.4)

## 2016-10-25 MED ORDER — HYDROCHLOROTHIAZIDE 25 MG PO TABS
25.0000 mg | ORAL_TABLET | Freq: Every day | ORAL | Status: DC
  Administered 2016-10-25 – 2016-10-26 (×2): 25 mg via ORAL
  Filled 2016-10-25 (×2): qty 1

## 2016-10-25 MED ORDER — GADOBENATE DIMEGLUMINE 529 MG/ML IV SOLN
20.0000 mL | Freq: Once | INTRAVENOUS | Status: AC | PRN
  Administered 2016-10-25: 20 mL via INTRAVENOUS

## 2016-10-25 MED ORDER — TAMSULOSIN HCL 0.4 MG PO CAPS
0.4000 mg | ORAL_CAPSULE | Freq: Every day | ORAL | Status: DC
  Administered 2016-10-25: 0.4 mg via ORAL
  Filled 2016-10-25: qty 1

## 2016-10-25 MED ORDER — LOSARTAN POTASSIUM 50 MG PO TABS
100.0000 mg | ORAL_TABLET | Freq: Every day | ORAL | Status: DC
  Administered 2016-10-25 – 2016-10-26 (×2): 100 mg via ORAL
  Filled 2016-10-25 (×2): qty 2

## 2016-10-25 NOTE — Progress Notes (Signed)
To echo via bed.

## 2016-10-25 NOTE — Progress Notes (Signed)
*  PRELIMINARY RESULTS* Echocardiogram 2D Echocardiogram has been performed.  Cristela BlueHege, Nuha Degner 10/25/2016, 7:54 AM

## 2016-10-25 NOTE — Progress Notes (Addendum)
Pt slept well throughout the night, getting up only to use rest room. Wife continues to be at bedside. No c/o pain SOB,or acute distress noted. Ran NSR throughout the night with HR running in the 70's and 80's.

## 2016-10-25 NOTE — Care Management Important Message (Signed)
Important Message  Patient Details  Name: Asencion IslamDaniel E Yeager MRN: [id number] Date of Birth: 06-19-1941   Medicare Important Message Given:  Yes Initial signed IM printed from Epic and given to patient.    Eber HongGreene, Fronie Holstein R, RN 10/25/2016, 3:14 PM

## 2016-10-25 NOTE — Progress Notes (Signed)
To MRI via bed

## 2016-10-25 NOTE — Progress Notes (Signed)
Back from MRI.

## 2016-10-25 NOTE — Progress Notes (Signed)
Down for EEG via bed

## 2016-10-25 NOTE — Progress Notes (Signed)
[facility] [place] [facility]   PATIENT NAME: Jose Ray    MR#:  [id number]  DATE OF BIRTH:  1941-05-10  SUBJECTIVE:   Pt. Here due to a syncopal episode.  No further episodes of syncope overnight.  No pain, or any other acute symptoms or events overnight. Patient has been evaluated both by cardiology and also neurology.  REVIEW OF SYSTEMS:    Review of Systems  Constitutional: Negative for chills and fever.  HENT: Negative for congestion and tinnitus.   Eyes: Negative for blurred vision and double vision.  Respiratory: Negative for cough, shortness of breath and wheezing.   Cardiovascular: Negative for chest pain, orthopnea and PND.  Gastrointestinal: Negative for abdominal pain, diarrhea, nausea and vomiting.  Genitourinary: Negative for dysuria and hematuria.  Neurological: Negative for dizziness, sensory change and focal weakness.  All other systems reviewed and are negative.   Nutrition: Heart healthy/Carb control Tolerating Diet: Yes Tolerating PT: ambulatory   DRUG ALLERGIES:   Allergies  Allergen Reactions  . Shrimp [Shellfish Allergy] Anaphylaxis  . Penicillins Other (See Comments)    Patient reports feeling faint when given Has patient had a PCN reaction causing immediate rash, facial/tongue/throat swelling, SOB or lightheadedness with hypotension: No Has patient had a PCN reaction causing severe rash involving mucus membranes or skin necrosis: No Has patient had a PCN reaction that required hospitalization No Has patient had a PCN reaction occurring within the last 10 years: No If all of the above answers are "NO", then may proceed with Cephalosporin use.     VITALS:  Blood pressure 129/64, pulse 87, temperature 97.6 F (36.4 C), temperature source Oral, resp. rate 14, height 5\' 11"  (1.803 m), weight 99.8 kg (220 lb), SpO2 92 %.  PHYSICAL EXAMINATION:   Physical Exam  GENERAL:  76 y.o.-year-old patient lying in the bed in no  acute distress.  EYES: Pupils equal, round, reactive to light and accommodation. No scleral icterus. Extraocular muscles intact. Significant periorbital swelling and bruising.  HEENT: Head atraumatic, normocephalic. Oropharynx and nasopharynx clear.  NECK:  Supple, no jugular venous distention. No thyroid enlargement, no tenderness.  LUNGS: Normal breath sounds bilaterally, no wheezing, rales, rhonchi. No use of accessory muscles of respiration.  CARDIOVASCULAR: S1, S2 normal. No murmurs, rubs, or gallops.  ABDOMEN: Soft, nontender, nondistended. Bowel sounds present. No organomegaly or mass.  EXTREMITIES: No cyanosis, clubbing or edema b/l.    NEUROLOGIC: Cranial nerves II through XII are intact. No focal Motor or sensory deficits b/l.   PSYCHIATRIC: The patient is alert and oriented x 3.  SKIN: No obvious rash, lesion, or ulcer.    LABORATORY PANEL:   CBC  Recent Labs Lab 10/25/16 0544  WBC 11.2*  HGB 15.1  HCT 44.7  PLT 198   ------------------------------------------------------------------------------------------------------------------  Chemistries   Recent Labs Lab 10/25/16 0544  NA 140  K 3.6  CL 103  CO2 28  GLUCOSE 185*  BUN 20  CREATININE 0.95  CALCIUM 9.4   ------------------------------------------------------------------------------------------------------------------  Cardiac Enzymes  Recent Labs Lab 10/25/16 1142  TROPONINI <0.03   ------------------------------------------------------------------------------------------------------------------  RADIOLOGY:  Ct Head Wo Contrast  Result Date: 10/24/2016 CLINICAL DATA:  Passed out at church and landed on his left eye EXAM: CT HEAD WITHOUT CONTRAST CT MAXILLOFACIAL WITHOUT CONTRAST TECHNIQUE: Multidetector CT imaging of the head and maxillofacial structures were performed using the standard protocol without intravenous contrast. Multiplanar CT image reconstructions of the maxillofacial structures  were also generated. COMPARISON:  None. FINDINGS: CT  HEAD FINDINGS Brain: No acute territorial infarction, intracranial hemorrhage or focal mass lesion is visualized. The ventricles are nonenlarged. Mild periventricular white matter hypodensity consistent with small vessel change. Mild atrophy. Vascular: No hyperdense vessels. Scattered calcifications in the carotid siphons. Skull: Mastoid air cells are clear.  No depressed skull fracture. Other: Large left periorbital hematoma. CT MAXILLOFACIAL FINDINGS Osseous: Bilateral zygomatic arches and pterygoid plates appear intact. Mandibular heads are normally position. Left TMJ disease. No mandibular fracture. The nasal bones appear intact. Orbits: Large periorbital hematoma. Globe is grossly intact. There is no intra or extraconal soft tissue abnormality. Extraocular muscles are symmetric. There is no orbital fracture identified. Sinuses: No acute fluid levels. No sinus wall fracture. Minimal mucosal thickening in the ethmoid sinuses. Soft tissues: Large periorbital soft tissue hematoma on the left. IMPRESSION: 1. No definite evidence for acute intracranial abnormality. Mild periventricular white matter hypodensity consistent with small vessel disease 2. No CT evidence for acute facial bone fracture. Large left periorbital soft tissue hematoma. Electronically Signed   By: Jasmine Pang M.D.   On: 10/24/2016 18:38   Ct Maxillofacial Wo Contrast  Result Date: 10/24/2016 CLINICAL DATA:  Passed out at church and landed on his left eye EXAM: CT HEAD WITHOUT CONTRAST CT MAXILLOFACIAL WITHOUT CONTRAST TECHNIQUE: Multidetector CT imaging of the head and maxillofacial structures were performed using the standard protocol without intravenous contrast. Multiplanar CT image reconstructions of the maxillofacial structures were also generated. COMPARISON:  None. FINDINGS: CT HEAD FINDINGS Brain: No acute territorial infarction, intracranial hemorrhage or focal mass lesion is  visualized. The ventricles are nonenlarged. Mild periventricular white matter hypodensity consistent with small vessel change. Mild atrophy. Vascular: No hyperdense vessels. Scattered calcifications in the carotid siphons. Skull: Mastoid air cells are clear.  No depressed skull fracture. Other: Large left periorbital hematoma. CT MAXILLOFACIAL FINDINGS Osseous: Bilateral zygomatic arches and pterygoid plates appear intact. Mandibular heads are normally position. Left TMJ disease. No mandibular fracture. The nasal bones appear intact. Orbits: Large periorbital hematoma. Globe is grossly intact. There is no intra or extraconal soft tissue abnormality. Extraocular muscles are symmetric. There is no orbital fracture identified. Sinuses: No acute fluid levels. No sinus wall fracture. Minimal mucosal thickening in the ethmoid sinuses. Soft tissues: Large periorbital soft tissue hematoma on the left. IMPRESSION: 1. No definite evidence for acute intracranial abnormality. Mild periventricular white matter hypodensity consistent with small vessel disease 2. No CT evidence for acute facial bone fracture. Large left periorbital soft tissue hematoma. Electronically Signed   By: Jasmine Pang M.D.   On: 10/24/2016 18:38     ASSESSMENT AND PLAN:   76 year old male with past medical history of diabetes, COPD, history of coronary artery disease status post bypass, previous history of MI who presents to the hospital due to a syncopal episode and collapse. Next  1. Syncope with collapse-etiology unclear but suspected to be cardiogenic in nature. -Patient has no prodromal symptoms prior to him passing out. No previous history of seizures or any head trauma. -No acute arrhythmias on telemetry. His previous syncope was over 2 years ago and his workup was essentially normal. Echocardiogram currently showing normal ejection fraction. -Seen by cardiology and plan for nuclear stress test tomorrow. Consideration for a Linq device  as an outpatient given duration between events.  - also seen by Neuro, MRI brain ordered and results pending. Follow up EEG.   2. Essential HTN - cont. Toprol, Losartan/HCTZ  3. BPH - cont. Flomax.   4. COPD - no acute  exacerbation. Cont. Spiriva, dulera  5. Hyperlipidemia - cont. Atorvastatin.    All the records are reviewed and case discussed with Care Management/Social Worker. Management plans discussed with the patient, family and they are in agreement.  CODE STATUS: Full code  DVT Prophylaxis: Lovenox  TOTAL TIME TAKING CARE OF THIS PATIENT: 30 minutes.   POSSIBLE D/C IN 1-2 DAYS, DEPENDING ON CLINICAL CONDITION.   Houston SirenSAINANI,Damichael Hofman J M.D on 10/25/2016 at 4:15 PM  Between 7am to 6pm - Pager - [phone]  After 6pm go to [email or web address] - Social research officer, governmentpassword [email or web address]  [facility] [place] [facility]  Office  [phone]  CC: Primary care physician; Pcp Not In System

## 2016-10-25 NOTE — Progress Notes (Signed)
Pt. Arrived to floor via stretcher, transferred to bed with standby assist. Tele applied, running NSR, verified with Marcelino DusterMichelle, Charity fundraiserN. Skin warm and dry with edema and bruising to left side of face, scabbed abrasion to left knee and shin. Skin assessed with Marcelino DusterMichelle, RN. Pt. Alert and oriented. No signs or c/o pain, SOB, or acute distress noted.  General room orientation given. Instruction on how to use ascom and call bell system given. Wife at bedside. Wife spoke with Dr. Anne HahnWillis on ascom about concerns with husband. Fall Dispensing opticiancontract signed. Wife brought outside food for pt. to eat. He ate 100%.

## 2016-10-25 NOTE — Consult Note (Signed)
[facility] Cardiology  CARDIOLOGY CONSULT NOTE  Patient ID: Jose Ray MRN: [id number] DOB/AGE: June 06, 1941 76 y.o.  Admit date: 10/24/2016 Referring Physician Cherlynn Kaiser Primary Physician [facility] Primary Cardiologist Gwen Pounds Reason for Consultation Syncope  HPI: 76 year old gentleman referred for a syncopal episode. Patient has a history of multiple syncopal episodes in the past, CAD, MI status post multiple stents in 2002 at [facility], systolic congestive heart failure, COPD on nocturnal oxygen, hypertension, hyperlipidemia, and type 2 diabetes. History was obtained from the patient and his wife who was present during the syncopal episode. Patient presented to the ER after having a syncopal episode that occurred while standing in the lobby of the church after the service. Prior to the episode, he had been in his usual state of health with no recent illnesses. Patient's wife states he suddenly fell forward, hitting his head on the concrete. Patient denies any prodromal symptoms, including lightheadedness, nausea, diaphoresis, chest pain, or palpitations, and does not recall the episode or the surrounding events. He was unconscious for about 10 minutes. Patient states this has happened at least 3 times in the last 8 years, the last occurring about 2 years ago. Patient underwent thorough cardiac work-up 2 years ago; Holter monitor performed on 10/28/2014 revealed baseline normal sinus rhythm with a minimum heart rate of 56 bpm with occasional PVCs. Lexiscan at the time was negative for ischemia. Carotid ultrasound revealed mild carotid atherosclerosis, which has been medically managed. Admission labs notable for negative troponin. Currently, the patient is resting in bed in no acute distress without complaints of chest pain, lightheadedness, peripheral edema or palpitations. He is on   Review of systems complete and found to be negative unless listed above     Past Medical History:  Diagnosis Date  . Acute  MI   . COPD (chronic obstructive pulmonary disease) (HCC)   . Coronary artery disease   . Diabetes mellitus without complication [facility])     Past Surgical History:  Procedure Laterality Date  . CORONARY ANGIOPLASTY WITH STENT PLACEMENT    . HERNIA REPAIR N/A     Prescriptions Prior to Admission  Medication Sig Dispense Refill Last Dose  . aspirin 81 MG tablet Take 81 mg by mouth daily.   10/24/2016 at Unknown time  . atorvastatin (LIPITOR) 80 MG tablet Take 80 mg by mouth daily.   10/24/2016 at Unknown time  . benzonatate (TESSALON) 200 MG capsule Take 200 mg by mouth 3 (three) times daily as needed for cough.   prn at prn  . fexofenadine (ALLEGRA) 180 MG tablet Take 180 mg by mouth daily.   10/24/2016 at Unknown time  . fluticasone (FLONASE) 50 MCG/ACT nasal spray Place 1 spray into both nostrils daily.   10/24/2016 at Unknown time  . Fluticasone-Salmeterol (ADVAIR) 250-50 MCG/DOSE AEPB Inhale 1 puff into the lungs 2 (two) times daily.   10/24/2016 at Unknown time  . ibuprofen (ADVIL,MOTRIN) 400 MG tablet Take 400 mg by mouth daily.   10/24/2016 at Unknown time  . losartan-hydrochlorothiazide (HYZAAR) 100-25 MG per tablet Take 1 tablet by mouth daily.   10/24/2016 at Unknown time  . metFORMIN (GLUCOPHAGE) 500 MG tablet Take by mouth 2 (two) times daily with a meal.   10/24/2016 at 0830  . metoprolol succinate (TOPROL-XL) 100 MG 24 hr tablet Take 50 mg by mouth daily. Take with or immediately following a meal.   10/24/2016 at 0830  . nitroGLYCERIN (NITROSTAT) 0.4 MG SL tablet Place 0.4 mg under the tongue every 5 (five)  minutes as needed for chest pain.   prn at prn  . OXYGEN Inhale 2.5-3 L into the lungs continuous. Used while sleeping and during exercise   10/23/2016 at Unknown time  . Polyethyl Glycol-Propyl Glycol (SYSTANE) 0.4-0.3 % SOLN Apply 1 drop to eye 4 (four) times daily as needed (dry eyes).   prn at prn  . tamsulosin (FLOMAX) 0.4 MG CAPS capsule Take 0.4 mg by mouth at bedtime.     10/23/2016 at Unknown time  . tiotropium (SPIRIVA) 18 MCG inhalation capsule Place 18 mcg into inhaler and inhale daily.   10/24/2016 at Unknown time  . Turmeric 500 MG CAPS Take 2 capsules by mouth 2 (two) times daily.   10/24/2016 at Unknown time   Social History   Social History  . Marital status: Married    Spouse name: N/A  . Number of children: N/A  . Years of education: N/A   Occupational History  . Not on file.   Social History Main Topics  . Smoking status: Former Games developer  . Smokeless tobacco: Never Used  . Alcohol use No  . Drug use: No  . Sexual activity: Yes   Other Topics Concern  . Not on file   Social History Narrative  . No narrative on file    Family History  Problem Relation Age of Onset  . Stomach cancer Mother   . Coronary artery disease Father       Review of systems complete and found to be negative unless listed above      PHYSICAL EXAM  General: Well developed, well nourished, in no acute distress HEENT:  Normocephalic. Hematoma L periorbital  Neck:  No JVD.  Lungs: Clear bilaterally to auscultation and percussion. Heart: HRRR . Normal S1 and S2 without gallops or murmurs.  Abdomen: Bowel sounds are positive, abdomen soft and non-tender  Msk:  Back normal, sitting upright in bed Extremities: No clubbing, cyanosis or edema.   Neuro: Alert and oriented X 3. Psych:  Good affect, responds appropriately  Labs:   Lab Results  Component Value Date   WBC 11.2 (H) 10/25/2016   HGB 15.1 10/25/2016   HCT 44.7 10/25/2016   MCV 81.5 10/25/2016   PLT 198 10/25/2016    Recent Labs Lab 10/25/16 0544  NA 140  K 3.6  CL 103  CO2 28  BUN 20  CREATININE 0.95  CALCIUM 9.4  GLUCOSE 185*   Lab Results  Component Value Date   TROPONINI <0.03 10/25/2016   No results found for: CHOL No results found for: HDL No results found for: LDLCALC No results found for: TRIG No results found for: CHOLHDL No results found for: LDLDIRECT    Radiology:  Ct Head Wo Contrast  Result Date: 10/24/2016 CLINICAL DATA:  Passed out at church and landed on his left eye EXAM: CT HEAD WITHOUT CONTRAST CT MAXILLOFACIAL WITHOUT CONTRAST TECHNIQUE: Multidetector CT imaging of the head and maxillofacial structures were performed using the standard protocol without intravenous contrast. Multiplanar CT image reconstructions of the maxillofacial structures were also generated. COMPARISON:  None. FINDINGS: CT HEAD FINDINGS Brain: No acute territorial infarction, intracranial hemorrhage or focal mass lesion is visualized. The ventricles are nonenlarged. Mild periventricular white matter hypodensity consistent with small vessel change. Mild atrophy. Vascular: No hyperdense vessels. Scattered calcifications in the carotid siphons. Skull: Mastoid air cells are clear.  No depressed skull fracture. Other: Large left periorbital hematoma. CT MAXILLOFACIAL FINDINGS Osseous: Bilateral zygomatic arches and pterygoid plates appear intact. Mandibular  heads are normally position. Left TMJ disease. No mandibular fracture. The nasal bones appear intact. Orbits: Large periorbital hematoma. Globe is grossly intact. There is no intra or extraconal soft tissue abnormality. Extraocular muscles are symmetric. There is no orbital fracture identified. Sinuses: No acute fluid levels. No sinus wall fracture. Minimal mucosal thickening in the ethmoid sinuses. Soft tissues: Large periorbital soft tissue hematoma on the left. IMPRESSION: 1. No definite evidence for acute intracranial abnormality. Mild periventricular white matter hypodensity consistent with small vessel disease 2. No CT evidence for acute facial bone fracture. Large left periorbital soft tissue hematoma. Electronically Signed   By: Jasmine PangKim  Fujinaga M.D.   On: 10/24/2016 18:38   Ct Maxillofacial Wo Contrast  Result Date: 10/24/2016 CLINICAL DATA:  Passed out at church and landed on his left eye EXAM: CT HEAD WITHOUT CONTRAST CT MAXILLOFACIAL  WITHOUT CONTRAST TECHNIQUE: Multidetector CT imaging of the head and maxillofacial structures were performed using the standard protocol without intravenous contrast. Multiplanar CT image reconstructions of the maxillofacial structures were also generated. COMPARISON:  None. FINDINGS: CT HEAD FINDINGS Brain: No acute territorial infarction, intracranial hemorrhage or focal mass lesion is visualized. The ventricles are nonenlarged. Mild periventricular white matter hypodensity consistent with small vessel change. Mild atrophy. Vascular: No hyperdense vessels. Scattered calcifications in the carotid siphons. Skull: Mastoid air cells are clear.  No depressed skull fracture. Other: Large left periorbital hematoma. CT MAXILLOFACIAL FINDINGS Osseous: Bilateral zygomatic arches and pterygoid plates appear intact. Mandibular heads are normally position. Left TMJ disease. No mandibular fracture. The nasal bones appear intact. Orbits: Large periorbital hematoma. Globe is grossly intact. There is no intra or extraconal soft tissue abnormality. Extraocular muscles are symmetric. There is no orbital fracture identified. Sinuses: No acute fluid levels. No sinus wall fracture. Minimal mucosal thickening in the ethmoid sinuses. Soft tissues: Large periorbital soft tissue hematoma on the left. IMPRESSION: 1. No definite evidence for acute intracranial abnormality. Mild periventricular white matter hypodensity consistent with small vessel disease 2. No CT evidence for acute facial bone fracture. Large left periorbital soft tissue hematoma. Electronically Signed   By: Jasmine PangKim  Fujinaga M.D.   On: 10/24/2016 18:38    EKG: Normal sinus rhythm, rate 89  ASSESSMENT AND PLAN:  1. Syncope of unknown etiology, unlikely vasovagal with no prodromal symptoms, with multiple prior episodes with negative past Holter monitor and Lexiscan. 2. Systolic congestive heart failure, appears clinically stable; does not appear fluid-overloaded. 3.  Coronary artery disease without chest pain and negative troponin.    Recommendations: 1. Agree with current therapy. 2. Review echocardiogram. 3. Lexiscan tomorrow 4. Linq device as outpatient 5. Outpatient neuro consult 6. Plan to discharge tomorrow pending results of Lexiscan. Patient will be given instructions to abstain from driving, and plan to follow-up with Dr. Gwen PoundsKowalski in one week.  Signed: Leanora Ivanoffnna Earnie Bechard, PA-C 10/25/2016, 8:34 AM

## 2016-10-25 NOTE — Progress Notes (Signed)
Back from 2D echo 

## 2016-10-25 NOTE — Consult Note (Addendum)
Reason for Consult:Recurrent syncope Referring Physician: Cherlynn Kaiser  CC: Syncope  HPI: Jose Ray is an 76 y.o. male who presents after a syncopal event. Patient has had syncopal events in the past, he and his wife stated that he has had about 3 or 4 prior syncopal events over the past 5 years. He has had these worked up by cardiology and pulmonology extensively, with no etiology determined. Patient and his wife state that his syncopal events the past have been somewhat short-lived, and although he denies a prodrome, he could ease himself down to the ground rather than falling. Last night was different in that he fell straight forward, hitting his face which has since resulted in a significant hematoma over his left eye. His wife states that he was unconscious this time for probably close to 10 minutes, which is longer than he has previously remained unconscious during his syncopal events. After he woke up this time he was able to speak with EMTs and seemed to be oriented, however afterwards at home he did not have any recollection of anything during that time period. Patient is now at baseline.  CBG on presentation was unremarkable.  Last syncopal episode was about 2 years ago.    Past Medical History:  Diagnosis Date  . Acute MI   . COPD (chronic obstructive pulmonary disease) (HCC)   . Coronary artery disease   . Diabetes mellitus without complication [facility])     Past Surgical History:  Procedure Laterality Date  . CORONARY ANGIOPLASTY WITH STENT PLACEMENT    . HERNIA REPAIR N/A     Family History  Problem Relation Age of Onset  . Stomach cancer Mother   . Coronary artery disease Father     Social History:  reports that he has quit smoking. He has never used smokeless tobacco. He reports that he does not drink alcohol or use drugs.  Allergies  Allergen Reactions  . Shrimp [Shellfish Allergy] Anaphylaxis  . Penicillins Other (See Comments)    Patient reports feeling faint when  given Has patient had a PCN reaction causing immediate rash, facial/tongue/throat swelling, SOB or lightheadedness with hypotension: No Has patient had a PCN reaction causing severe rash involving mucus membranes or skin necrosis: No Has patient had a PCN reaction that required hospitalization No Has patient had a PCN reaction occurring within the last 10 years: No If all of the above answers are "NO", then may proceed with Cephalosporin use.     Medications:  I have reviewed the patient's current medications. Prior to Admission:  Prescriptions Prior to Admission  Medication Sig Dispense Refill Last Dose  . aspirin 81 MG tablet Take 81 mg by mouth daily.   10/24/2016 at Unknown time  . atorvastatin (LIPITOR) 80 MG tablet Take 80 mg by mouth daily.   10/24/2016 at Unknown time  . benzonatate (TESSALON) 200 MG capsule Take 200 mg by mouth 3 (three) times daily as needed for cough.   prn at prn  . fexofenadine (ALLEGRA) 180 MG tablet Take 180 mg by mouth daily.   10/24/2016 at Unknown time  . fluticasone (FLONASE) 50 MCG/ACT nasal spray Place 1 spray into both nostrils daily.   10/24/2016 at Unknown time  . Fluticasone-Salmeterol (ADVAIR) 250-50 MCG/DOSE AEPB Inhale 1 puff into the lungs 2 (two) times daily.   10/24/2016 at Unknown time  . ibuprofen (ADVIL,MOTRIN) 400 MG tablet Take 400 mg by mouth daily.   10/24/2016 at Unknown time  . losartan-hydrochlorothiazide (HYZAAR) 100-25 MG per  tablet Take 1 tablet by mouth daily.   10/24/2016 at Unknown time  . metFORMIN (GLUCOPHAGE) 500 MG tablet Take by mouth 2 (two) times daily with a meal.   10/24/2016 at 0830  . metoprolol succinate (TOPROL-XL) 100 MG 24 hr tablet Take 50 mg by mouth daily. Take with or immediately following a meal.   10/24/2016 at 0830  . nitroGLYCERIN (NITROSTAT) 0.4 MG SL tablet Place 0.4 mg under the tongue every 5 (five) minutes as needed for chest pain.   prn at prn  . OXYGEN Inhale 2.5-3 L into the lungs continuous. Used while  sleeping and during exercise   10/23/2016 at Unknown time  . Polyethyl Glycol-Propyl Glycol (SYSTANE) 0.4-0.3 % SOLN Apply 1 drop to eye 4 (four) times daily as needed (dry eyes).   prn at prn  . tamsulosin (FLOMAX) 0.4 MG CAPS capsule Take 0.4 mg by mouth at bedtime.    10/23/2016 at Unknown time  . tiotropium (SPIRIVA) 18 MCG inhalation capsule Place 18 mcg into inhaler and inhale daily.   10/24/2016 at Unknown time  . Turmeric 500 MG CAPS Take 2 capsules by mouth 2 (two) times daily.   10/24/2016 at Unknown time   Scheduled: . aspirin EC  81 mg Oral Daily  . atorvastatin  80 mg Oral Daily  . enoxaparin (LOVENOX) injection  40 mg Subcutaneous Q24H  . losartan  100 mg Oral Daily   And  . hydrochlorothiazide  25 mg Oral Daily  . insulin aspart  0-5 Units Subcutaneous QHS  . insulin aspart  0-9 Units Subcutaneous TID WC  . metoprolol succinate  50 mg Oral Daily  . mometasone-formoterol  2 puff Inhalation BID  . pantoprazole  40 mg Oral Daily  . tamsulosin  0.4 mg Oral QHS  . tiotropium  18 mcg Inhalation Daily    ROS: History obtained from the patient  General ROS: negative for - chills, fatigue, fever, night sweats, weight gain or weight loss Psychological ROS: negative for - behavioral disorder, hallucinations, memory difficulties, mood swings or suicidal ideation Ophthalmic ROS: periorbital hematoma ENT ROS: negative for - epistaxis, nasal discharge, oral lesions, sore throat, tinnitus or vertigo Allergy and Immunology ROS: negative for - hives or itchy/watery eyes Hematological and Lymphatic ROS: negative for - bleeding problems, bruising or swollen lymph nodes Endocrine ROS: negative for - galactorrhea, hair pattern changes, polydipsia/polyuria or temperature intolerance Respiratory ROS: negative for - cough, hemoptysis, shortness of breath or wheezing Cardiovascular ROS: negative for - chest pain, dyspnea on exertion, edema or irregular heartbeat Gastrointestinal ROS: negative for  - abdominal pain, diarrhea, hematemesis, nausea/vomiting or stool incontinence Genito-Urinary ROS: negative for - dysuria, hematuria, incontinence or urinary frequency/urgency Musculoskeletal ROS: negative for - joint swelling or muscular weakness Neurological ROS: as noted in HPI Dermatological ROS: negative for rash and skin lesion changes  Physical Examination: Blood pressure 129/64, pulse 87, temperature 97.6 F (36.4 C), temperature source Oral, resp. rate 14, height 5\' 11"  (1.803 m), weight 99.8 kg (220 lb), SpO2 92 %.  HEENT-  Normocephalic, periorbital hematoma.  Normal TM's bilaterally.  Normal auditory canals and external ears. Normal external nose, mucus membranes and septum.  Normal pharynx. Cardiovascular- S1, S2 normal, pulses palpable throughout   Lungs- chest clear, no wheezing, rales, normal symmetric air entry Abdomen- soft, non-tender; bowel sounds normal; no masses,  no organomegaly Extremities- no edema Lymph-no adenopathy palpable Musculoskeletal-no joint tenderness, deformity or swelling Skin-warm and dry, no hyperpigmentation, vitiligo, or suspicious lesions  Neurological Examination  Mental Status: Alert, oriented, thought content appropriate.  Speech fluent without evidence of aphasia.  Able to follow 3 step commands without difficulty. Cranial Nerves: II: Discs flat bilaterally; Visual fields grossly normal, pupils equal, round, reactive to light and accommodation III,IV, VI: ptosis not present, extra-ocular motions intact bilaterally V,VII: smile symmetric, facial light touch sensation normal bilaterally VIII: hearing normal bilaterally IX,X: gag reflex present XI: bilateral shoulder shrug XII: midline tongue extension Motor: Right : Upper extremity   5/5    Left:     Upper extremity   5/5  Lower extremity   5/5     Lower extremity   5/5 Tone and bulk:normal tone throughout; no atrophy noted Sensory: Pinprick and light touch intact throughout,  bilaterally Deep Tendon Reflexes: 2+ and symmetric with absent AJ's bilaterally Plantars: Right: downgoing   Left: downgoing Cerebellar: Normal finger-to-nose and normal heel-to-shin testing bilaterally Gait: not tested due to safety concerns    Laboratory Studies:   Basic Metabolic Panel:  Recent Labs Lab 10/24/16 1738 10/25/16 0544  NA 136 140  K 3.9 3.6  CL 101 103  CO2 26 28  GLUCOSE 168* 185*  BUN 19 20  CREATININE 0.84 0.95  CALCIUM 9.5 9.4    Liver Function Tests: No results for input(s): AST, ALT, ALKPHOS, BILITOT, PROT, ALBUMIN in the last 168 hours. No results for input(s): LIPASE, AMYLASE in the last 168 hours. No results for input(s): AMMONIA in the last 168 hours.  CBC:  Recent Labs Lab 10/24/16 1738 10/25/16 0544  WBC 12.6* 11.2*  HGB 15.5 15.1  HCT 45.3 44.7  MCV 81.9 81.5  PLT 199 198    Cardiac Enzymes:  Recent Labs Lab 10/24/16 1738 10/25/16 0027 10/25/16 0544 10/25/16 1142  TROPONINI <0.03 <0.03 <0.03 <0.03    BNP: Invalid input(s): POCBNP  CBG:  Recent Labs Lab 10/24/16 1744 10/25/16 0018 10/25/16 0800 10/25/16 1227  GLUCAP 149* 128* 123* 128*    Microbiology: No results found for this or any previous visit.  Coagulation Studies: No results for input(s): LABPROT, INR in the last 72 hours.  Urinalysis:  Recent Labs Lab 10/24/16 1911  COLORURINE YELLOW*  LABSPEC 1.010  PHURINE 8.0  GLUCOSEU NEGATIVE  HGBUR NEGATIVE  BILIRUBINUR NEGATIVE  KETONESUR NEGATIVE  PROTEINUR NEGATIVE  NITRITE NEGATIVE  LEUKOCYTESUR NEGATIVE    Lipid Panel:  No results found for: CHOL, TRIG, HDL, CHOLHDL, VLDL, LDLCALC  HgbA1C: No results found for: HGBA1C  Urine Drug Screen:  No results found for: LABOPIA, COCAINSCRNUR, LABBENZ, AMPHETMU, THCU, LABBARB  Alcohol Level: No results for input(s): ETH in the last 168 hours.  Other results: EKG: normal sinus rhythm at 80 bpm.  Imaging: Ct Head Wo Contrast  Result Date:  10/24/2016 CLINICAL DATA:  Passed out at church and landed on his left eye EXAM: CT HEAD WITHOUT CONTRAST CT MAXILLOFACIAL WITHOUT CONTRAST TECHNIQUE: Multidetector CT imaging of the head and maxillofacial structures were performed using the standard protocol without intravenous contrast. Multiplanar CT image reconstructions of the maxillofacial structures were also generated. COMPARISON:  None. FINDINGS: CT HEAD FINDINGS Brain: No acute territorial infarction, intracranial hemorrhage or focal mass lesion is visualized. The ventricles are nonenlarged. Mild periventricular white matter hypodensity consistent with small vessel change. Mild atrophy. Vascular: No hyperdense vessels. Scattered calcifications in the carotid siphons. Skull: Mastoid air cells are clear.  No depressed skull fracture. Other: Large left periorbital hematoma. CT MAXILLOFACIAL FINDINGS Osseous: Bilateral zygomatic arches and pterygoid plates appear intact. Mandibular heads are normally position.  Left TMJ disease. No mandibular fracture. The nasal bones appear intact. Orbits: Large periorbital hematoma. Globe is grossly intact. There is no intra or extraconal soft tissue abnormality. Extraocular muscles are symmetric. There is no orbital fracture identified. Sinuses: No acute fluid levels. No sinus wall fracture. Minimal mucosal thickening in the ethmoid sinuses. Soft tissues: Large periorbital soft tissue hematoma on the left. IMPRESSION: 1. No definite evidence for acute intracranial abnormality. Mild periventricular white matter hypodensity consistent with small vessel disease 2. No CT evidence for acute facial bone fracture. Large left periorbital soft tissue hematoma. Electronically Signed   By: Jasmine Pang M.D.   On: 10/24/2016 18:38   Ct Maxillofacial Wo Contrast  Result Date: 10/24/2016 CLINICAL DATA:  Passed out at church and landed on his left eye EXAM: CT HEAD WITHOUT CONTRAST CT MAXILLOFACIAL WITHOUT CONTRAST TECHNIQUE:  Multidetector CT imaging of the head and maxillofacial structures were performed using the standard protocol without intravenous contrast. Multiplanar CT image reconstructions of the maxillofacial structures were also generated. COMPARISON:  None. FINDINGS: CT HEAD FINDINGS Brain: No acute territorial infarction, intracranial hemorrhage or focal mass lesion is visualized. The ventricles are nonenlarged. Mild periventricular white matter hypodensity consistent with small vessel change. Mild atrophy. Vascular: No hyperdense vessels. Scattered calcifications in the carotid siphons. Skull: Mastoid air cells are clear.  No depressed skull fracture. Other: Large left periorbital hematoma. CT MAXILLOFACIAL FINDINGS Osseous: Bilateral zygomatic arches and pterygoid plates appear intact. Mandibular heads are normally position. Left TMJ disease. No mandibular fracture. The nasal bones appear intact. Orbits: Large periorbital hematoma. Globe is grossly intact. There is no intra or extraconal soft tissue abnormality. Extraocular muscles are symmetric. There is no orbital fracture identified. Sinuses: No acute fluid levels. No sinus wall fracture. Minimal mucosal thickening in the ethmoid sinuses. Soft tissues: Large periorbital soft tissue hematoma on the left. IMPRESSION: 1. No definite evidence for acute intracranial abnormality. Mild periventricular white matter hypodensity consistent with small vessel disease 2. No CT evidence for acute facial bone fracture. Large left periorbital soft tissue hematoma. Electronically Signed   By: Jasmine Pang M.D.   On: 10/24/2016 18:38     Assessment/Plan: 76 year old male with recurrent syncope.  Etiology unclear.  This particular episode was somewhat different from those in the past.  Patient with uncompensated fall, longer period of unconsciousness and with some memory issues afterward.  Suspect that many of these differences may be secondary to concussive symptoms, such as memory  issues (which are improving) and period of unconsciousness.  Head CT reviewed and shows no acute changes.  Will perform further testing to rule out possibility of neurological causes for syncope.     Recommendations: 1.  Orthostatic vitals 2.  EEG 3.  MRI of the brain without contrast  4.  Serum magnesium and phosphorus  Thana Farr, MD Neurology [phone] 10/25/2016, 1:45 PM

## 2016-10-26 ENCOUNTER — Inpatient Hospital Stay: Payer: Medicare Other

## 2016-10-26 LAB — GLUCOSE, CAPILLARY
GLUCOSE-CAPILLARY: 133 mg/dL — AB (ref 65–99)
Glucose-Capillary: 141 mg/dL — ABNORMAL HIGH (ref 65–99)

## 2016-10-26 LAB — NM MYOCAR MULTI W/SPECT W/WALL MOTION / EF
CHL CUP MPHR: 145 {beats}/min
CHL CUP NUCLEAR SDS: 2
CHL CUP NUCLEAR SSS: 24
CHL CUP RESTING HR STRESS: 72 {beats}/min
CSEPED: 1 min
CSEPEDS: 0 s
CSEPEW: 1 METS
CSEPPHR: 92 {beats}/min
LV dias vol: 128 mL (ref 62–150)
LV sys vol: 48 mL
NUC STRESS TID: 1.03
Percent HR: 63 %
SRS: 29

## 2016-10-26 LAB — HEMOGLOBIN A1C
Hgb A1c MFr Bld: 7 % — ABNORMAL HIGH (ref 4.8–5.6)
MEAN PLASMA GLUCOSE: 154 mg/dL

## 2016-10-26 MED ORDER — LEVETIRACETAM 500 MG PO TABS
500.0000 mg | ORAL_TABLET | Freq: Two times a day (BID) | ORAL | Status: DC
  Administered 2016-10-26: 500 mg via ORAL
  Filled 2016-10-26: qty 1

## 2016-10-26 MED ORDER — TECHNETIUM TC 99M TETROFOSMIN IV KIT
12.5420 | PACK | Freq: Once | INTRAVENOUS | Status: AC | PRN
  Administered 2016-10-26: 12.542 via INTRAVENOUS

## 2016-10-26 MED ORDER — TECHNETIUM TC 99M TETROFOSMIN IV KIT
31.9300 | PACK | Freq: Once | INTRAVENOUS | Status: AC | PRN
  Administered 2016-10-26: 31.93 via INTRAVENOUS

## 2016-10-26 MED ORDER — REGADENOSON 0.4 MG/5ML IV SOLN
0.4000 mg | Freq: Once | INTRAVENOUS | Status: AC
  Administered 2016-10-26: 0.4 mg via INTRAVENOUS

## 2016-10-26 MED ORDER — LEVETIRACETAM 500 MG PO TABS: 500.0000 mg | ORAL_TABLET | Freq: Two times a day (BID) | ORAL | 2 refills | Status: DC

## 2016-10-26 NOTE — Progress Notes (Addendum)
Subjective: Patient without further syncopal events.  Stable  Objective: Current vital signs: BP 137/68 (BP Location: Left Arm)   Pulse 78   Temp 97.8 F (36.6 C) (Oral)   Resp 17   Ht 5\' 11"  (1.803 m)   Wt 99.8 kg (220 lb)   SpO2 90%   BMI 30.68 kg/m  Vital signs in last 24 hours: Temp:  [97.4 F (36.3 C)-98.3 F (36.8 C)] 97.8 F (36.6 C) (01/16 1123) Pulse Rate:  [70-78] 78 (01/16 1123) Resp:  [17-18] 17 (01/16 1123) BP: (128-137)/(67-72) 137/68 (01/16 1123) SpO2:  [90 %-93 %] 90 % (01/16 1123)  Intake/Output from previous day: 01/15 0701 - 01/16 0700 In: 840 [P.O.:840] Out: 2075 [Urine:2075] Intake/Output this shift: Total I/O In: 240 [P.O.:240] Out: 900 [Urine:900] Nutritional status: Diet heart healthy/carb modified Room service appropriate? Yes; Fluid consistency: Thin Diet - low sodium heart healthy  Neurologic Exam: Mental Status: Alert, oriented, thought content appropriate.  Speech fluent without evidence of aphasia.  Able to follow 3 step commands without difficulty. Cranial Nerves: II: Discs flat bilaterally; Visual fields grossly normal, pupils equal, round, reactive to light and accommodation III,IV, VI: ptosis not present, extra-ocular motions intact bilaterally V,VII: smile symmetric, facial light touch sensation normal bilaterally VIII: hearing normal bilaterally IX,X: gag reflex present XI: bilateral shoulder shrug XII: midline tongue extension Motor: Right :  Upper extremity   5/5                                      Left:     Upper extremity   5/5             Lower extremity   5/5                                                  Lower extremity   5/5 Tone and bulk:normal tone throughout; no atrophy noted   Lab Results: Basic Metabolic Panel:  Recent Labs Lab 10/24/16 1738 10/25/16 0544 10/25/16 1716  NA 136 140  --   K 3.9 3.6  --   CL 101 103  --   CO2 26 28  --   GLUCOSE 168* 185*  --   BUN 19 20  --   CREATININE 0.84 0.95  --    CALCIUM 9.5 9.4  --   MG  --   --  1.9  PHOS  --   --  3.3    Liver Function Tests: No results for input(s): AST, ALT, ALKPHOS, BILITOT, PROT, ALBUMIN in the last 168 hours. No results for input(s): LIPASE, AMYLASE in the last 168 hours. No results for input(s): AMMONIA in the last 168 hours.  CBC:  Recent Labs Lab 10/24/16 1738 10/25/16 0544  WBC 12.6* 11.2*  HGB 15.5 15.1  HCT 45.3 44.7  MCV 81.9 81.5  PLT 199 198    Cardiac Enzymes:  Recent Labs Lab 10/24/16 1738 10/25/16 0027 10/25/16 0544 10/25/16 1142  TROPONINI <0.03 <0.03 <0.03 <0.03    Lipid Panel: No results for input(s): CHOL, TRIG, HDL, CHOLHDL, VLDL, LDLCALC in the last 168 hours.  CBG:  Recent Labs Lab 10/25/16 1422 10/25/16 1654 10/25/16 2057 10/26/16 0720 10/26/16 1126  GLUCAP 159* 124* 163* 141* 133*    Microbiology:  No results found for this or any previous visit.  Coagulation Studies: No results for input(s): LABPROT, INR in the last 72 hours.  Imaging: Ct Head Wo Contrast  Result Date: 10/24/2016 CLINICAL DATA:  Passed out at church and landed on his left eye EXAM: CT HEAD WITHOUT CONTRAST CT MAXILLOFACIAL WITHOUT CONTRAST TECHNIQUE: Multidetector CT imaging of the head and maxillofacial structures were performed using the standard protocol without intravenous contrast. Multiplanar CT image reconstructions of the maxillofacial structures were also generated. COMPARISON:  None. FINDINGS: CT HEAD FINDINGS Brain: No acute territorial infarction, intracranial hemorrhage or focal mass lesion is visualized. The ventricles are nonenlarged. Mild periventricular white matter hypodensity consistent with small vessel change. Mild atrophy. Vascular: No hyperdense vessels. Scattered calcifications in the carotid siphons. Skull: Mastoid air cells are clear.  No depressed skull fracture. Other: Large left periorbital hematoma. CT MAXILLOFACIAL FINDINGS Osseous: Bilateral zygomatic arches and pterygoid  plates appear intact. Mandibular heads are normally position. Left TMJ disease. No mandibular fracture. The nasal bones appear intact. Orbits: Large periorbital hematoma. Globe is grossly intact. There is no intra or extraconal soft tissue abnormality. Extraocular muscles are symmetric. There is no orbital fracture identified. Sinuses: No acute fluid levels. No sinus wall fracture. Minimal mucosal thickening in the ethmoid sinuses. Soft tissues: Large periorbital soft tissue hematoma on the left. IMPRESSION: 1. No definite evidence for acute intracranial abnormality. Mild periventricular white matter hypodensity consistent with small vessel disease 2. No CT evidence for acute facial bone fracture. Large left periorbital soft tissue hematoma. Electronically Signed   By: Jasmine Pang M.D.   On: 10/24/2016 18:38   Mr Laqueta Jean GN Contrast  Result Date: 10/25/2016 CLINICAL DATA:  76 year old diabetic male with syncopal episode. Subsequent encounter. EXAM: MRI HEAD WITHOUT AND WITH CONTRAST TECHNIQUE: Multiplanar, multiecho pulse sequences of the brain and surrounding structures were obtained without and with intravenous contrast. CONTRAST:  20mL MULTIHANCE GADOBENATE DIMEGLUMINE 529 MG/ML IV SOLN COMPARISON:  10/24/2016 head and face CT. FINDINGS: Brain: No acute infarct or intracranial hemorrhage. Remote left corona radiata/ superior subinsular infarct. Mild moderate chronic microvascular changes. Mild atrophy typical of age without hydrocephalus. No intracranial mass or abnormal enhancement. Small pituitary gland probably an incidental finding. Vascular: Major intracranial vascular structures are patent. Skull and upper cervical spine: No acute abnormality. Sinuses/Orbits: Left preseptal hematoma from recent trauma. Globes appear to be intact. Mild mucosal thickening maxillary sinuses. Minimal mucosal thickening frontal sinuses and maxillary sinuses. Other: Negative IMPRESSION: No acute infarct or intracranial  hemorrhage. Remote left corona radiata/ superior subinsular infarct. Mild to moderate chronic microvascular changes. Mild atrophy typical of age without hydrocephalus. No intracranial mass or abnormal enhancement. Left preseptal hematoma from recent trauma. Electronically Signed   By: Lacy Duverney M.D.   On: 10/25/2016 16:37   Nm Myocar Multi W/spect W/wall Motion / Ef  Result Date: 10/26/2016  Blood pressure demonstrated a normal response to exercise.  There was no ST segment deviation noted during stress.  Defect 1: There is a medium defect of moderate severity present in the apex location.  Defect 2: There is a medium defect of moderate severity present in the mid anterior location.  Findings consistent with ischemia and prior myocardial infarction.  This is an intermediate risk study.  The left ventricular ejection fraction is mildly decreased (45-54%).    Ct Maxillofacial Wo Contrast  Result Date: 10/24/2016 CLINICAL DATA:  Passed out at church and landed on his left eye EXAM: CT HEAD WITHOUT CONTRAST CT MAXILLOFACIAL  WITHOUT CONTRAST TECHNIQUE: Multidetector CT imaging of the head and maxillofacial structures were performed using the standard protocol without intravenous contrast. Multiplanar CT image reconstructions of the maxillofacial structures were also generated. COMPARISON:  None. FINDINGS: CT HEAD FINDINGS Brain: No acute territorial infarction, intracranial hemorrhage or focal mass lesion is visualized. The ventricles are nonenlarged. Mild periventricular white matter hypodensity consistent with small vessel change. Mild atrophy. Vascular: No hyperdense vessels. Scattered calcifications in the carotid siphons. Skull: Mastoid air cells are clear.  No depressed skull fracture. Other: Large left periorbital hematoma. CT MAXILLOFACIAL FINDINGS Osseous: Bilateral zygomatic arches and pterygoid plates appear intact. Mandibular heads are normally position. Left TMJ disease. No mandibular  fracture. The nasal bones appear intact. Orbits: Large periorbital hematoma. Globe is grossly intact. There is no intra or extraconal soft tissue abnormality. Extraocular muscles are symmetric. There is no orbital fracture identified. Sinuses: No acute fluid levels. No sinus wall fracture. Minimal mucosal thickening in the ethmoid sinuses. Soft tissues: Large periorbital soft tissue hematoma on the left. IMPRESSION: 1. No definite evidence for acute intracranial abnormality. Mild periventricular white matter hypodensity consistent with small vessel disease 2. No CT evidence for acute facial bone fracture. Large left periorbital soft tissue hematoma. Electronically Signed   By: Jasmine PangKim  Fujinaga M.D.   On: 10/24/2016 18:38    Medications:  I have reviewed the patient's current medications. Scheduled: . aspirin EC  81 mg Oral Daily  . atorvastatin  80 mg Oral Daily  . enoxaparin (LOVENOX) injection  40 mg Subcutaneous Q24H  . losartan  100 mg Oral Daily   And  . hydrochlorothiazide  25 mg Oral Daily  . insulin aspart  0-5 Units Subcutaneous QHS  . insulin aspart  0-9 Units Subcutaneous TID WC  . levETIRAcetam  500 mg Oral BID  . metoprolol succinate  50 mg Oral Daily  . mometasone-formoterol  2 puff Inhalation BID  . pantoprazole  40 mg Oral Daily  . tamsulosin  0.4 mg Oral QHS  . tiotropium  18 mcg Inhalation Daily    Assessment/Plan: No further syncopal events.  Patient not orthostatic.  Cardiac work up unremarkable.  EEG shows right temporal sharp activity raising concern as seizure for etiology of syncopal events.  MRI of the brain reviewed and shows no acute changes.  This was discussed with patient and wife.    Recommendations: 1.  Keppra 500mg  BID.  No load required.  Possible side effects discussed. 2.  Patient unable to drive, operate heavy machinery, perform activities at heights and participate in water activities until release by outpatient physician.  Patient to follow up with  neurology on an outpatient basis.     LOS: 2 days   Thana FarrLeslie Amariyana Heacox, MD Neurology [phone] 10/26/2016  3:21 PM

## 2016-10-26 NOTE — Progress Notes (Signed)
Back from nuclear medicine 

## 2016-10-26 NOTE — Progress Notes (Signed)
[facility]  SUBJECTIVE: Patient states he feels fine. He has chronic shortness of breath that is unchanged. He denies chest pain or palpitations. He has had no recurrence of syncope since his admission.   Vitals:   10/25/16 2300 10/26/16 0428 10/26/16 0712 10/26/16 0716  BP:  131/68 133/67 133/67  Pulse:  70 71 76  Resp:   17 17  Temp: 98.3 F (36.8 C) 97.5 F (36.4 C) 97.4 F (36.3 C) 97.4 F (36.3 C)  TempSrc: Oral Oral Oral Oral  SpO2:  93% 91% 91%  Weight:      Height:         Intake/Output Summary (Last 24 hours) at 10/26/16 1014 Last data filed at 10/26/16 0716  Gross per 24 hour  Intake              600 ml  Output             1975 ml  Net            -1375 ml      PHYSICAL EXAM  General: Well developed, well nourished, in no acute distress HEENT:  Normocephalic. Hematoma L periorbital Neck:  No JVD.  Lungs: Clear bilaterally to auscultation Heart: HRRR . Normal S1 and S2 without gallops or murmurs.  Abdomen: Bowel sounds are positive, abdomen soft and non-tender  Msk:  Back normal, lying in bed Extremities: No clubbing, cyanosis or edema.   Neuro: Alert and oriented X 3. Psych:  Good affect, responds appropriately   LABS: Basic Metabolic Panel:  Recent Labs  16/07/9600/14/18 1738 10/25/16 0544 10/25/16 1716  NA 136 140  --   K 3.9 3.6  --   CL 101 103  --   CO2 26 28  --   GLUCOSE 168* 185*  --   BUN 19 20  --   CREATININE 0.84 0.95  --   CALCIUM 9.5 9.4  --   MG  --   --  1.9  PHOS  --   --  3.3   Liver Function Tests: No results for input(s): AST, ALT, ALKPHOS, BILITOT, PROT, ALBUMIN in the last 72 hours. No results for input(s): LIPASE, AMYLASE in the last 72 hours. CBC:  Recent Labs  10/24/16 1738 10/25/16 0544  WBC 12.6* 11.2*  HGB 15.5 15.1  HCT 45.3 44.7  MCV 81.9 81.5  PLT 199 198   Cardiac Enzymes:  Recent Labs  10/25/16 0027 10/25/16 0544 10/25/16 1142  TROPONINI <0.03 <0.03 <0.03   BNP: Invalid input(s):  POCBNP D-Dimer: No results for input(s): DDIMER in the last 72 hours. Hemoglobin A1C:  Recent Labs  10/25/16 0544  HGBA1C 7.0*   Fasting Lipid Panel: No results for input(s): CHOL, HDL, LDLCALC, TRIG, CHOLHDL, LDLDIRECT in the last 72 hours. Thyroid Function Tests: No results for input(s): TSH, T4TOTAL, T3FREE, THYROIDAB in the last 72 hours.  Invalid input(s): FREET3 Anemia Panel: No results for input(s): VITAMINB12, FOLATE, FERRITIN, TIBC, IRON, RETICCTPCT in the last 72 hours.  Ct Head Wo Contrast  Result Date: 10/24/2016 CLINICAL DATA:  Passed out at church and landed on his left eye EXAM: CT HEAD WITHOUT CONTRAST CT MAXILLOFACIAL WITHOUT CONTRAST TECHNIQUE: Multidetector CT imaging of the head and maxillofacial structures were performed using the standard protocol without intravenous contrast. Multiplanar CT image reconstructions of the maxillofacial structures were also generated. COMPARISON:  None. FINDINGS: CT HEAD FINDINGS Brain: No acute territorial infarction, intracranial hemorrhage or focal mass lesion is visualized. The ventricles are nonenlarged.  Mild periventricular white matter hypodensity consistent with small vessel change. Mild atrophy. Vascular: No hyperdense vessels. Scattered calcifications in the carotid siphons. Skull: Mastoid air cells are clear.  No depressed skull fracture. Other: Large left periorbital hematoma. CT MAXILLOFACIAL FINDINGS Osseous: Bilateral zygomatic arches and pterygoid plates appear intact. Mandibular heads are normally position. Left TMJ disease. No mandibular fracture. The nasal bones appear intact. Orbits: Large periorbital hematoma. Globe is grossly intact. There is no intra or extraconal soft tissue abnormality. Extraocular muscles are symmetric. There is no orbital fracture identified. Sinuses: No acute fluid levels. No sinus wall fracture. Minimal mucosal thickening in the ethmoid sinuses. Soft tissues: Large periorbital soft tissue hematoma  on the left. IMPRESSION: 1. No definite evidence for acute intracranial abnormality. Mild periventricular white matter hypodensity consistent with small vessel disease 2. No CT evidence for acute facial bone fracture. Large left periorbital soft tissue hematoma. Electronically Signed   By: Jasmine Pang M.D.   On: 10/24/2016 18:38   Mr Laqueta Jean ZO Contrast  Result Date: 10/25/2016 CLINICAL DATA:  76 year old diabetic male with syncopal episode. Subsequent encounter. EXAM: MRI HEAD WITHOUT AND WITH CONTRAST TECHNIQUE: Multiplanar, multiecho pulse sequences of the brain and surrounding structures were obtained without and with intravenous contrast. CONTRAST:  20mL MULTIHANCE GADOBENATE DIMEGLUMINE 529 MG/ML IV SOLN COMPARISON:  10/24/2016 head and face CT. FINDINGS: Brain: No acute infarct or intracranial hemorrhage. Remote left corona radiata/ superior subinsular infarct. Mild moderate chronic microvascular changes. Mild atrophy typical of age without hydrocephalus. No intracranial mass or abnormal enhancement. Small pituitary gland probably an incidental finding. Vascular: Major intracranial vascular structures are patent. Skull and upper cervical spine: No acute abnormality. Sinuses/Orbits: Left preseptal hematoma from recent trauma. Globes appear to be intact. Mild mucosal thickening maxillary sinuses. Minimal mucosal thickening frontal sinuses and maxillary sinuses. Other: Negative IMPRESSION: No acute infarct or intracranial hemorrhage. Remote left corona radiata/ superior subinsular infarct. Mild to moderate chronic microvascular changes. Mild atrophy typical of age without hydrocephalus. No intracranial mass or abnormal enhancement. Left preseptal hematoma from recent trauma. Electronically Signed   By: Lacy Duverney M.D.   On: 10/25/2016 16:37   Ct Maxillofacial Wo Contrast  Result Date: 10/24/2016 CLINICAL DATA:  Passed out at church and landed on his left eye EXAM: CT HEAD WITHOUT CONTRAST CT  MAXILLOFACIAL WITHOUT CONTRAST TECHNIQUE: Multidetector CT imaging of the head and maxillofacial structures were performed using the standard protocol without intravenous contrast. Multiplanar CT image reconstructions of the maxillofacial structures were also generated. COMPARISON:  None. FINDINGS: CT HEAD FINDINGS Brain: No acute territorial infarction, intracranial hemorrhage or focal mass lesion is visualized. The ventricles are nonenlarged. Mild periventricular white matter hypodensity consistent with small vessel change. Mild atrophy. Vascular: No hyperdense vessels. Scattered calcifications in the carotid siphons. Skull: Mastoid air cells are clear.  No depressed skull fracture. Other: Large left periorbital hematoma. CT MAXILLOFACIAL FINDINGS Osseous: Bilateral zygomatic arches and pterygoid plates appear intact. Mandibular heads are normally position. Left TMJ disease. No mandibular fracture. The nasal bones appear intact. Orbits: Large periorbital hematoma. Globe is grossly intact. There is no intra or extraconal soft tissue abnormality. Extraocular muscles are symmetric. There is no orbital fracture identified. Sinuses: No acute fluid levels. No sinus wall fracture. Minimal mucosal thickening in the ethmoid sinuses. Soft tissues: Large periorbital soft tissue hematoma on the left. IMPRESSION: 1. No definite evidence for acute intracranial abnormality. Mild periventricular white matter hypodensity consistent with small vessel disease 2. No CT evidence for acute facial bone  fracture. Large left periorbital soft tissue hematoma. Electronically Signed   By: Jasmine Pang M.D.   On: 10/24/2016 18:38     Echo: LV EF 55-60%  TELEMETRY: Normal sinus rhythm, rate 82 bpm  ASSESSMENT AND PLAN:  Principal Problem:   Syncope and collapse Active Problems:   Diabetes (HCC)   CAD (coronary artery disease)   HTN (hypertension)    1. Syncope and collapse, of unknown etiology, unlikely vasovagal with no  prodromal symptoms with negative previous work-up for syncope in the past. 2. Systolic congestive heart failure, appears clinically stable and not fluid-overloaded. 3. Coronary artery disease without chest pain and negative troponin.  Recommendations: 1. Agree with current therapy. 2. Lexiscan today, results pending.  3. Further recommendations pending patient course, results of Lexiscan, and neuro recommendations following EEG.   Leanora Ivanoff, PA-C 10/26/2016 10:14 AM

## 2016-10-26 NOTE — Progress Notes (Signed)
Pt discharged to home via wc.  Instructions and rx given to pt.  Questions answered.  No distress.  

## 2016-10-26 NOTE — Progress Notes (Signed)
Lexiscan showed anteroapical scar with peri-infarct ischemia, which is consistent with history of anterior MI and stents. Patient denies chest pain. We now have a likely etiology of syncopal episodes with epileptic potential on EEG. Defer cardiac catheterization at this time.

## 2016-10-26 NOTE — Progress Notes (Signed)
To nuclear medicine via bed 

## 2016-10-27 NOTE — Discharge Summary (Signed)
[facility] [place] [facility]   PATIENT NAME: Jose Ray    MR#:  [id number]  DATE OF BIRTH:  [birth year]  DATE OF ADMISSION:  10/24/2016 ADMITTING PHYSICIAN: Oralia Manis, MD  DATE OF DISCHARGE: 10/26/2016  3:04 PM  PRIMARY CARE PHYSICIAN: Pcp Not In System    ADMISSION DIAGNOSIS:  Syncope and collapse [R55] Fall [W19.XXXA] Concussion with loss of consciousness of 30 minutes or less, initial encounter [S06.0X1A] Facial contusion, initial encounter [S00.83XA]  DISCHARGE DIAGNOSIS:  Principal Problem:   Syncope and collapse Active Problems:   Diabetes (HCC)   CAD (coronary artery disease)   HTN (hypertension)   SECONDARY DIAGNOSIS:   Past Medical History:  Diagnosis Date  . Acute MI   . COPD (chronic obstructive pulmonary disease) (HCC)   . Coronary artery disease   . Diabetes mellitus without complication [facility])     HOSPITAL COURSE:   76 year old male with past medical history of diabetes, COPD, history of coronary artery disease status post bypass, previous history of MI who presents to the hospital due to a syncopal episode and collapse. Next  1. Syncope with collapse-Patient was admitted to the hospital and observed on telemetry. He had no arrhythmias. A cardiology and neurology consult was obtained. -Patient underwent a nuclear medicine stress test which showed evidence of previous MI but no acute ischemia or ST changes. His ejection fraction was 45-50%. -Patient was also seen by neurology and underwent a CT scan of the head, MRI of the brain which was essentially benign. Patient did have an EEG which was positive. Patient has been started on Keppra -he will be discharged on Keppra and follow up with Neurology as an outpatient. - He will also follow with Cardiology for possibly need for Linq device and EP evaluation if needed.   2. Essential HTN - pt. Will cont. Toprol, Losartan/HCTZ  3. BPH - pt. Will cont. Flomax.   4. COPD - no acute  exacerbation. Cont. Spiriva, dulera  5. Hyperlipidemia - cont. Atorvastatin.   DISCHARGE CONDITIONS:   Stable  CONSULTS OBTAINED:  Treatment Team:  Marcina Millard, MD Kym Groom, MD  DRUG ALLERGIES:   Allergies  Allergen Reactions  . Shrimp [Shellfish Allergy] Anaphylaxis  . Penicillins Other (See Comments)    Patient reports feeling faint when given Has patient had a PCN reaction causing immediate rash, facial/tongue/throat swelling, SOB or lightheadedness with hypotension: No Has patient had a PCN reaction causing severe rash involving mucus membranes or skin necrosis: No Has patient had a PCN reaction that required hospitalization No Has patient had a PCN reaction occurring within the last 10 years: No If all of the above answers are "NO", then may proceed with Cephalosporin use.     DISCHARGE MEDICATIONS:   Allergies as of 10/26/2016      Reactions   Shrimp [shellfish Allergy] Anaphylaxis   Penicillins Other (See Comments)   Patient reports feeling faint when given Has patient had a PCN reaction causing immediate rash, facial/tongue/throat swelling, SOB or lightheadedness with hypotension: No Has patient had a PCN reaction causing severe rash involving mucus membranes or skin necrosis: No Has patient had a PCN reaction that required hospitalization No Has patient had a PCN reaction occurring within the last 10 years: No If all of the above answers are "NO", then may proceed with Cephalosporin use.      Medication List    TAKE these medications   aspirin 81 MG tablet Take 81 mg by mouth daily.  atorvastatin 80 MG tablet Commonly known as:  LIPITOR Take 80 mg by mouth daily.   benzonatate 200 MG capsule Commonly known as:  TESSALON Take 200 mg by mouth 3 (three) times daily as needed for cough.   fexofenadine 180 MG tablet Commonly known as:  ALLEGRA Take 180 mg by mouth daily.   fluticasone 50 MCG/ACT nasal spray Commonly known as:   FLONASE Place 1 spray into both nostrils daily.   Fluticasone-Salmeterol 250-50 MCG/DOSE Aepb Commonly known as:  ADVAIR Inhale 1 puff into the lungs 2 (two) times daily.   ibuprofen 400 MG tablet Commonly known as:  ADVIL,MOTRIN Take 400 mg by mouth daily.   levETIRAcetam 500 MG tablet Commonly known as:  KEPPRA Take 1 tablet (500 mg total) by mouth 2 (two) times daily.   losartan-hydrochlorothiazide 100-25 MG tablet Commonly known as:  HYZAAR Take 1 tablet by mouth daily.   metFORMIN 500 MG tablet Commonly known as:  GLUCOPHAGE Take by mouth 2 (two) times daily with a meal.   metoprolol succinate 100 MG 24 hr tablet Commonly known as:  TOPROL-XL Take 50 mg by mouth daily. Take with or immediately following a meal.   nitroGLYCERIN 0.4 MG SL tablet Commonly known as:  NITROSTAT Place 0.4 mg under the tongue every 5 (five) minutes as needed for chest pain.   OXYGEN Inhale 2.5-3 L into the lungs continuous. Used while sleeping and during exercise   SYSTANE 0.4-0.3 % Soln Generic drug:  Polyethyl Glycol-Propyl Glycol Apply 1 drop to eye 4 (four) times daily as needed (dry eyes).   tamsulosin 0.4 MG Caps capsule Commonly known as:  FLOMAX Take 0.4 mg by mouth at bedtime.   tiotropium 18 MCG inhalation capsule Commonly known as:  SPIRIVA Place 18 mcg into inhaler and inhale daily.   Turmeric 500 MG Caps Take 2 capsules by mouth 2 (two) times daily.         DISCHARGE INSTRUCTIONS:   DIET:  Cardiac diet  DISCHARGE CONDITION:  Stable  ACTIVITY:  Activity as tolerated  OXYGEN:  Home Oxygen: No.   Oxygen Delivery: room air  DISCHARGE LOCATION:  home   If you experience worsening of your admission symptoms, develop shortness of breath, life threatening emergency, suicidal or homicidal thoughts you must seek medical attention immediately by calling 911 or calling your MD immediately  if symptoms less severe.  You Must read complete  instructions/literature along with all the possible adverse reactions/side effects for all the Medicines you take and that have been prescribed to you. Take any new Medicines after you have completely understood and accpet all the possible adverse reactions/side effects.   Please note  You were cared for by a hospitalist during your hospital stay. If you have any questions about your discharge medications or the care you received while you were in the hospital after you are discharged, you can call the unit and asked to speak with the hospitalist on call if the hospitalist that took care of you is not available. Once you are discharged, your primary care physician will handle any further medical issues. Please note that NO REFILLS for any discharge medications will be authorized once you are discharged, as it is imperative that you return to your primary care physician (or establish a relationship with a primary care physician if you do not have one) for your aftercare needs so that they can reassess your need for medications and monitor your lab values.     Today  No further syncope overnight. No acute complaints presently.  EEG was positive as per Neuro and started on Keppra.   VITAL SIGNS:  Blood pressure 137/68, pulse 78, temperature 97.8 F (36.6 C), temperature source Oral, resp. rate 17, height 5\' 11"  (1.803 m), weight 99.8 kg (220 lb), SpO2 90 %.  I/O:   Intake/Output Summary (Last 24 hours) at 10/27/16 1153 Last data filed at 10/26/16 1415  Gross per 24 hour  Intake              240 ml  Output              600 ml  Net             -360 ml    PHYSICAL EXAMINATION:   GENERAL:  75 y.o.-year-old patient lying in the bed in no acute distress.  EYES: Pupils equal, round, reactive to light and accommodation. No scleral icterus. Extraocular muscles intact. periorbital swelling and bruising improving.   HEENT: Head atraumatic, normocephalic. Oropharynx and nasopharynx clear.  NECK:   Supple, no jugular venous distention. No thyroid enlargement, no tenderness.  LUNGS: Normal breath sounds bilaterally, no wheezing, rales, rhonchi. No use of accessory muscles of respiration.  CARDIOVASCULAR: S1, S2 normal. No murmurs, rubs, or gallops.  ABDOMEN: Soft, nontender, nondistended. Bowel sounds present. No organomegaly or mass.  EXTREMITIES: No cyanosis, clubbing or edema b/l.    NEUROLOGIC: Cranial nerves II through XII are intact. No focal Motor or sensory deficits b/l.   PSYCHIATRIC: The patient is alert and oriented x 3.  SKIN: No obvious rash, lesion, or ulcer.  DATA REVIEW:   CBC  Recent Labs Lab 10/25/16 0544  WBC 11.2*  HGB 15.1  HCT 44.7  PLT 198    Chemistries   Recent Labs Lab 10/25/16 0544 10/25/16 1716  NA 140  --   K 3.6  --   CL 103  --   CO2 28  --   GLUCOSE 185*  --   BUN 20  --   CREATININE 0.95  --   CALCIUM 9.4  --   MG  --  1.9    Cardiac Enzymes  Recent Labs Lab 10/25/16 1142  TROPONINI <0.03    Microbiology Results  No results found for this or any previous visit.  RADIOLOGY:  Mr Laqueta Jean Wo Contrast  Result Date: 10/25/2016 CLINICAL DATA:  76 year old diabetic male with syncopal episode. Subsequent encounter. EXAM: MRI HEAD WITHOUT AND WITH CONTRAST TECHNIQUE: Multiplanar, multiecho pulse sequences of the brain and surrounding structures were obtained without and with intravenous contrast. CONTRAST:  20mL MULTIHANCE GADOBENATE DIMEGLUMINE 529 MG/ML IV SOLN COMPARISON:  10/24/2016 head and face CT. FINDINGS: Brain: No acute infarct or intracranial hemorrhage. Remote left corona radiata/ superior subinsular infarct. Mild moderate chronic microvascular changes. Mild atrophy typical of age without hydrocephalus. No intracranial mass or abnormal enhancement. Small pituitary gland probably an incidental finding. Vascular: Major intracranial vascular structures are patent. Skull and upper cervical spine: No acute abnormality.  Sinuses/Orbits: Left preseptal hematoma from recent trauma. Globes appear to be intact. Mild mucosal thickening maxillary sinuses. Minimal mucosal thickening frontal sinuses and maxillary sinuses. Other: Negative IMPRESSION: No acute infarct or intracranial hemorrhage. Remote left corona radiata/ superior subinsular infarct. Mild to moderate chronic microvascular changes. Mild atrophy typical of age without hydrocephalus. No intracranial mass or abnormal enhancement. Left preseptal hematoma from recent trauma. Electronically Signed   By: Lacy Duverney M.D.   On: 10/25/2016 16:37   Nm Myocar  Multi W/spect W/wall Motion / Ef  Result Date: 10/26/2016  Blood pressure demonstrated a normal response to exercise.  There was no ST segment deviation noted during stress.  Defect 1: There is a medium defect of moderate severity present in the apex location.  Defect 2: There is a medium defect of moderate severity present in the mid anterior location.  Findings consistent with ischemia and prior myocardial infarction.  This is an intermediate risk study.  The left ventricular ejection fraction is mildly decreased (45-54%).       Management plans discussed with the patient, family and they are in agreement.  CODE STATUS:  Code Status History    Date Active Date Inactive Code Status Order ID Comments User Context   10/24/2016 11:52 PM 10/26/2016  6:09 PM Full Code [id number]  Oralia Manisavid Willis, MD Inpatient    Advance Directive Documentation   Flowsheet Row Most Recent Value  Type of Advance Directive  Healthcare Power of Attorney, Living will  Pre-existing out of facility DNR order (yellow form or pink MOST form)  No data  "MOST" Form in Place?  No data      TOTAL TIME TAKING CARE OF THIS PATIENT: 40 minutes.    Houston SirenSAINANI,VIVEK J M.D on 10/27/2016 at 11:53 AM  Between 7am to 6pm - Pager - [phone]  After 6pm go to [email or web address] - Social research officer, governmentpassword [email or web address]  [facility] [place] [facility]  Office   [phone]  CC: Primary care physician; Pcp Not In System

## 2016-12-29 ENCOUNTER — Other Ambulatory Visit: Payer: Self-pay | Admitting: Specialist

## 2016-12-29 DIAGNOSIS — R911 Solitary pulmonary nodule: Secondary | ICD-10-CM

## 2016-12-30 ENCOUNTER — Ambulatory Visit
Admission: RE | Admit: 2016-12-30 | Discharge: 2016-12-30 | Disposition: A | Discharge status: Home / Self Care | Payer: Medicare Other | Source: Ambulatory Visit | Attending: Specialist | Admitting: Specialist

## 2016-12-30 DIAGNOSIS — J449 Chronic obstructive pulmonary disease, unspecified: Secondary | ICD-10-CM | POA: Insufficient documentation

## 2016-12-30 DIAGNOSIS — K5730 Diverticulosis of large intestine without perforation or abscess without bleeding: Secondary | ICD-10-CM | POA: Insufficient documentation

## 2016-12-30 DIAGNOSIS — K760 Fatty (change of) liver, not elsewhere classified: Secondary | ICD-10-CM | POA: Diagnosis not present

## 2016-12-30 DIAGNOSIS — K7689 Other specified diseases of liver: Secondary | ICD-10-CM | POA: Insufficient documentation

## 2016-12-30 DIAGNOSIS — R911 Solitary pulmonary nodule: Secondary | ICD-10-CM | POA: Diagnosis present

## 2016-12-30 DIAGNOSIS — J432 Centrilobular emphysema: Secondary | ICD-10-CM | POA: Insufficient documentation

## 2016-12-30 DIAGNOSIS — I2510 Atherosclerotic heart disease of native coronary artery without angina pectoris: Secondary | ICD-10-CM | POA: Diagnosis not present

## 2016-12-30 DIAGNOSIS — I700 Atherosclerosis of aorta: Secondary | ICD-10-CM | POA: Insufficient documentation

## 2016-12-30 DIAGNOSIS — Q858 Other phakomatoses, not elsewhere classified: Secondary | ICD-10-CM | POA: Insufficient documentation

## 2016-12-30 MED ORDER — IOPAMIDOL (ISOVUE-300) INJECTION 61%
150.0000 mL | Freq: Once | INTRAVENOUS | Status: AC | PRN
  Administered 2016-12-30: 75 mL via INTRAVENOUS

## 2018-02-10 ENCOUNTER — Other Ambulatory Visit: Payer: Self-pay | Admitting: Family Medicine

## 2018-02-10 DIAGNOSIS — M5416 Radiculopathy, lumbar region: Secondary | ICD-10-CM

## 2018-02-15 ENCOUNTER — Ambulatory Visit
Admission: RE | Admit: 2018-02-15 | Discharge: 2018-02-15 | Disposition: A | Discharge status: Home / Self Care | Payer: Medicare Other | Source: Ambulatory Visit | Attending: Family Medicine | Admitting: Family Medicine

## 2018-02-15 DIAGNOSIS — M48061 Spinal stenosis, lumbar region without neurogenic claudication: Secondary | ICD-10-CM | POA: Diagnosis not present

## 2018-02-15 DIAGNOSIS — M4804 Spinal stenosis, thoracic region: Secondary | ICD-10-CM | POA: Diagnosis not present

## 2018-02-15 DIAGNOSIS — M5137 Other intervertebral disc degeneration, lumbosacral region: Secondary | ICD-10-CM | POA: Diagnosis not present

## 2018-02-15 DIAGNOSIS — M4316 Spondylolisthesis, lumbar region: Secondary | ICD-10-CM | POA: Diagnosis not present

## 2018-02-15 DIAGNOSIS — M5416 Radiculopathy, lumbar region: Secondary | ICD-10-CM | POA: Insufficient documentation

## 2018-02-17 ENCOUNTER — Ambulatory Visit: Payer: BLUE CROSS/BLUE SHIELD

## 2018-07-31 ENCOUNTER — Encounter
Admission: RE | Admit: 2018-07-31 | Discharge: 2018-07-31 | Disposition: A | Discharge status: Home / Self Care | Payer: Medicare Other | Source: Ambulatory Visit | Attending: Urology | Admitting: Urology

## 2018-07-31 ENCOUNTER — Other Ambulatory Visit: Payer: Self-pay

## 2018-07-31 DIAGNOSIS — I10 Essential (primary) hypertension: Secondary | ICD-10-CM | POA: Diagnosis not present

## 2018-07-31 DIAGNOSIS — J449 Chronic obstructive pulmonary disease, unspecified: Secondary | ICD-10-CM | POA: Insufficient documentation

## 2018-07-31 DIAGNOSIS — E119 Type 2 diabetes mellitus without complications: Secondary | ICD-10-CM | POA: Diagnosis not present

## 2018-07-31 DIAGNOSIS — Z01818 Encounter for other preprocedural examination: Secondary | ICD-10-CM | POA: Insufficient documentation

## 2018-07-31 HISTORY — DX: Gastro-esophageal reflux disease without esophagitis: K21.9

## 2018-07-31 HISTORY — DX: Essential (primary) hypertension: I10

## 2018-07-31 HISTORY — DX: Personal history of other diseases of the digestive system: Z87.19

## 2018-07-31 HISTORY — DX: Heart failure, unspecified: I50.9

## 2018-07-31 LAB — CBC
HCT: 46.5 % (ref 39.0–52.0)
Hemoglobin: 15.5 g/dL (ref 13.0–17.0)
MCH: 28.2 pg (ref 26.0–34.0)
MCHC: 33.3 g/dL (ref 30.0–36.0)
MCV: 84.7 fL (ref 80.0–100.0)
Platelets: 180 10*3/uL (ref 150–400)
RBC: 5.49 MIL/uL (ref 4.22–5.81)
RDW: 13.5 % (ref 11.5–15.5)
WBC: 8.4 10*3/uL (ref 4.0–10.5)
nRBC: 0 % (ref 0.0–0.2)

## 2018-07-31 LAB — BASIC METABOLIC PANEL
Anion gap: 8 (ref 5–15)
BUN: 17 mg/dL (ref 8–23)
CO2: 27 mmol/L (ref 22–32)
CREATININE: 0.92 mg/dL (ref 0.61–1.24)
Calcium: 8.7 mg/dL — ABNORMAL LOW (ref 8.9–10.3)
Chloride: 107 mmol/L (ref 98–111)
GFR calc Af Amer: >60 mL/min (ref >60)
Glucose, Bld: 122 mg/dL — ABNORMAL HIGH (ref 70–99)
POTASSIUM: 3.6 mmol/L (ref 3.5–5.1)
SODIUM: 142 mmol/L (ref 135–145)

## 2018-07-31 NOTE — Patient Instructions (Signed)
  Your procedure is scheduled on: Tuesday August 08, 2018 Report to Same Day Surgery 2nd floor [facility] Entrance-take elevator on left to 2nd floor.  Check in with surgery information desk.) To find out your arrival time, call [phone] 1:00-3:00 PM on Monday August 07, 2018  Remember: Instructions that are not followed completely may result in serious medical risk, up to and including death, or upon the discretion of your surgeon and anesthesiologist your surgery may need to be rescheduled.    __x__ 1. Do not eat food (including mints, candies, chewing gum) after midnight the night before your procedure. You may drink water up to 2 hours before you are scheduled to arrive at the hospital for your procedure.  Do not drink anything within 2 hours of your scheduled arrival to the hospital.    __x__ 2. No Alcohol for 24 hours before or after surgery.   __x__ 3. No Smoking or e-cigarettes for 24 hours before surgery.  Do not use any chewable tobacco products for at least 6 hours before surgery.   __x__ 4. Notify your doctor if there is any change in your medical condition (cold, fever, infections).   __x__ 5. On the morning of surgery brush your teeth with toothpaste and water.  You may rinse your mouth with mouthwash if you wish.  Do not swallow any toothpaste or mouthwash.   __x__ Use antibacterial soap such as Dial to shower/bathe on the day of surgery.   Do not wear jewelry, lotions, powders, deodorant, or perfumes on the day of surgery.   Do not shave below the face/neck 48 hours prior to surgery.   Do not bring valuables to the hospital.    [facility] is not responsible for any belongings or valuables.               Contacts, dentures or bridgework may not be worn into surgery.  For patients discharged on the day of surgery, you will NOT be permitted to drive yourself home.  You must have a responsible adult with you for 24 hours after surgery.  __x__ Take  anti-hypertensive listed below, cardiac, seizure, asthma, anti-reflux and psychiatric medicines with a small sip of water on the morning of surgery. These include:  1. Famotidine (Pepcid)  2. Metoprolol (Toprol)  3. Fexofenadine (Allegra)  __x__ Use inhalers (Advair, Spiriva) on the day of surgery and bring them with you to the hospital.  __x__ Stop Metformin 2 days before surgery (Last dose Saturday August 05, 2018).    __x__ Follow recommendations from Cardiologist, Pulmonologist or PCP regarding stopping Aspirin, Coumadin, Plavix, Eliquis, Effient, Pradaxa, and Pletal.  __x__ TODAY: Stop Anti-inflammatories such as aspirin, Advil, Ibuprofen, Motrin, Aleve, Naproxen, Naprosyn, BC/Goodies powders or aspirin-containing products. You may continue to take Tylenol and Celebrex.   __x__ TODAY: Stop supplements (Turmeric) until after surgery. You may continue to take Vitamin D, Vitamin B, and multivitamin.

## 2018-08-01 NOTE — Pre-Procedure Instructions (Signed)
EKG OK BY DR Priscella Mann 07/31/18

## 2018-08-02 NOTE — H&P (Signed)
NAME: Jose Ray, Jose Ray MEDICAL RECORD ZO:[id number] ACCOUNT [id number] DATE OF BIRTH:1941-06-05 FACILITY: [facility] LOCATION: [facility] PHYSICIAN:Arihaan Bellucci Gilles Chiquito, MD  HISTORY AND PHYSICAL  DATE OF ADMISSION:  08/08/2018  CHIEF COMPLAINT:  Difficulty voiding.  HISTORY OF PRESENT ILLNESS:  The patient is a 77 year old white male with a long history of BPH and lower urinary tract symptoms.  He continues to have significant symptoms and evidence of urinary retention in spite of treatment with tamsulosin.   Evaluation in the office indicated a postvoid residual of over 500 mL.  Prostate ultrasound in September indicated a 77.3 g prostate with hypoechoic lesions.  He had no evidence of A median lobe.  Flexible cystoscopy also confirmed lateral lobe prostatic  hypertrophy with a 4 cm prostatic urethra without evidence of median lobe.  Most recent PSA was 2.8 ng/mL on August 12.  The patient comes in now for UroLift procedure.  ALLERGIES:  ALLERGIC TO PENICILLIN AND SHELLFISH PEPPER AND SALMON.  CURRENT MEDICATIONS:  Included metoprolol, losartan, HCTZ, Zantac, Advair Diskus, atorvastatin, aspirin, fluticasone, tamsulosin, turmeric, methylprednisolone, azithromycin, Tessalon Perles, oxygen while sleeping and during exercise, Systane eyedrops.  PAST SURGICAL HISTORY: 1.  Bilateral vasectomy in 1972.   2.  Right inguinal herniorrhaphy 1980. 3.  Coronary artery disease  with angioplasty 2002.  PAST AND CURRENT MEDICAL CONDITIONS.   1. Coronary artery disease. 2.  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 3.  Hyperlipidemia. 4.  Sleep apnea. 5.  Peripheral neuropathy. 6.  Hypertension. 7.  Carpal tunnel syndrome.   8.  Asthma.   9.  Diabetes.   10.  Allergic rhinitis.  REVIEW OF SYSTEMS:  The patient has chronic shortness of breath.  He denies chest pain or history of stroke.  SOCIAL HISTORY:  The patient denied tobacco use.  He consumes 1-4 alcoholic beverages per  week.  FAMILY HISTORY:  Positive for heart disease and hypertension.  PHYSICAL EXAMINATION: GENERAL:  Obese white male in no acute distress. HEENT:  Sclerae were clear.  Pupils are equally round, reactive to light and accommodation.  Extraocular movements are intact. NECK:  No palpable cervical adenopathy. PULMONARY:  Lungs are clear, but there was some respiratory effort. CARDIOVASCULAR:  Regular rhythm and rate. ABDOMEN:  Soft, nontender abdomen. GENITOURINARY:  Uncircumcised.  Testes atrophic.  A 12 mL size each. RECTAL:  Greater than 50 gram smooth, nontender prostate. NEUROMUSCULAR:  Alert and oriented x3.  IMPRESSION:  Benign prostatic hypertrophy with bladder outlet obstruction.  PLAN:   1.  UroLift procedure.   2.  The patient has been cleared from pulmonary and cardiovascular standpoint by Dr. Meredeth Ide.  AN/NUANCE  D:08/02/2018 T:08/02/2018 JOB:[id number]

## 2018-08-08 ENCOUNTER — Ambulatory Visit
Admission: RE | Admit: 2018-08-08 | Discharge: 2018-08-08 | Disposition: A | Discharge status: Home / Self Care | Payer: Medicare Other | Source: Ambulatory Visit | Attending: Urology | Admitting: Urology

## 2018-08-08 ENCOUNTER — Encounter: Payer: Self-pay | Admitting: *Deleted

## 2018-08-08 ENCOUNTER — Other Ambulatory Visit: Payer: Self-pay

## 2018-08-08 ENCOUNTER — Ambulatory Visit: Payer: Medicare Other | Admitting: Anesthesiology

## 2018-08-08 ENCOUNTER — Encounter
Admission: RE | Disposition: A | Discharge status: Home / Self Care | Payer: Self-pay | Source: Ambulatory Visit | Attending: Urology

## 2018-08-08 DIAGNOSIS — Z7951 Long term (current) use of inhaled steroids: Secondary | ICD-10-CM | POA: Insufficient documentation

## 2018-08-08 DIAGNOSIS — N138 Other obstructive and reflux uropathy: Secondary | ICD-10-CM | POA: Diagnosis not present

## 2018-08-08 DIAGNOSIS — R338 Other retention of urine: Secondary | ICD-10-CM | POA: Insufficient documentation

## 2018-08-08 DIAGNOSIS — Z79899 Other long term (current) drug therapy: Secondary | ICD-10-CM | POA: Diagnosis not present

## 2018-08-08 DIAGNOSIS — I252 Old myocardial infarction: Secondary | ICD-10-CM | POA: Diagnosis not present

## 2018-08-08 DIAGNOSIS — E785 Hyperlipidemia, unspecified: Secondary | ICD-10-CM | POA: Diagnosis not present

## 2018-08-08 DIAGNOSIS — K219 Gastro-esophageal reflux disease without esophagitis: Secondary | ICD-10-CM | POA: Diagnosis not present

## 2018-08-08 DIAGNOSIS — N401 Enlarged prostate with lower urinary tract symptoms: Secondary | ICD-10-CM | POA: Insufficient documentation

## 2018-08-08 DIAGNOSIS — G4730 Sleep apnea, unspecified: Secondary | ICD-10-CM | POA: Diagnosis not present

## 2018-08-08 DIAGNOSIS — E1142 Type 2 diabetes mellitus with diabetic polyneuropathy: Secondary | ICD-10-CM | POA: Diagnosis not present

## 2018-08-08 DIAGNOSIS — Z7982 Long term (current) use of aspirin: Secondary | ICD-10-CM | POA: Diagnosis not present

## 2018-08-08 DIAGNOSIS — I509 Heart failure, unspecified: Secondary | ICD-10-CM | POA: Diagnosis not present

## 2018-08-08 DIAGNOSIS — I2510 Atherosclerotic heart disease of native coronary artery without angina pectoris: Secondary | ICD-10-CM | POA: Insufficient documentation

## 2018-08-08 DIAGNOSIS — Z955 Presence of coronary angioplasty implant and graft: Secondary | ICD-10-CM | POA: Insufficient documentation

## 2018-08-08 DIAGNOSIS — I110 Hypertensive heart disease with heart failure: Secondary | ICD-10-CM | POA: Diagnosis not present

## 2018-08-08 DIAGNOSIS — J449 Chronic obstructive pulmonary disease, unspecified: Secondary | ICD-10-CM | POA: Diagnosis not present

## 2018-08-08 DIAGNOSIS — Z9981 Dependence on supplemental oxygen: Secondary | ICD-10-CM | POA: Diagnosis not present

## 2018-08-08 DIAGNOSIS — Z7952 Long term (current) use of systemic steroids: Secondary | ICD-10-CM | POA: Diagnosis not present

## 2018-08-08 HISTORY — PX: CYSTOSCOPY WITH INSERTION OF UROLIFT: SHX6678

## 2018-08-08 LAB — GLUCOSE, CAPILLARY: Glucose-Capillary: 133 mg/dL — ABNORMAL HIGH (ref 70–99)

## 2018-08-08 SURGERY — CYSTOSCOPY WITH INSERTION OF UROLIFT
Anesthesia: General | Site: Prostate

## 2018-08-08 MED ORDER — LACTATED RINGERS IV SOLN
INTRAVENOUS | Status: DC | PRN
  Administered 2018-08-08: 16:00:00 via INTRAVENOUS

## 2018-08-08 MED ORDER — LIDOCAINE HCL URETHRAL/MUCOSAL 2 % EX GEL
CUTANEOUS | Status: DC | PRN
  Administered 2018-08-08: 1 via URETHRAL

## 2018-08-08 MED ORDER — PROPOFOL 10 MG/ML IV BOLUS
INTRAVENOUS | Status: AC
  Filled 2018-08-08: qty 20

## 2018-08-08 MED ORDER — OXYCODONE HCL 5 MG/5ML PO SOLN: 5.0000 mg | Freq: Once | ORAL | Status: DC | PRN

## 2018-08-08 MED ORDER — FENTANYL CITRATE (PF) 100 MCG/2ML IJ SOLN
INTRAMUSCULAR | Status: AC
  Filled 2018-08-08: qty 2

## 2018-08-08 MED ORDER — OXYCODONE HCL 5 MG PO TABS: 5.0000 mg | ORAL_TABLET | Freq: Once | ORAL | Status: DC | PRN

## 2018-08-08 MED ORDER — ONDANSETRON HCL 4 MG/2ML IJ SOLN
INTRAMUSCULAR | Status: AC
  Filled 2018-08-08: qty 2

## 2018-08-08 MED ORDER — DEXAMETHASONE SODIUM PHOSPHATE 10 MG/ML IJ SOLN
INTRAMUSCULAR | Status: AC
  Filled 2018-08-08: qty 1

## 2018-08-08 MED ORDER — ONDANSETRON HCL 4 MG/2ML IJ SOLN
INTRAMUSCULAR | Status: DC | PRN
  Administered 2018-08-08: 4 mg via INTRAVENOUS

## 2018-08-08 MED ORDER — PHENYLEPHRINE HCL 10 MG/ML IJ SOLN
INTRAMUSCULAR | Status: DC | PRN
  Administered 2018-08-08: 50 ug via INTRAVENOUS
  Administered 2018-08-08: 100 ug via INTRAVENOUS

## 2018-08-08 MED ORDER — MEPERIDINE HCL 50 MG/ML IJ SOLN: 6.2500 mg | INTRAMUSCULAR | Status: DC | PRN

## 2018-08-08 MED ORDER — CIPROFLOXACIN IN D5W 400 MG/200ML IV SOLN
400.0000 mg | Freq: Once | INTRAVENOUS | Status: AC
  Administered 2018-08-08: 400 mg via INTRAVENOUS

## 2018-08-08 MED ORDER — CIPROFLOXACIN HCL 500 MG PO TABS: 500.0000 mg | ORAL_TABLET | Freq: Two times a day (BID) | ORAL | 0 refills | Status: DC

## 2018-08-08 MED ORDER — URIBEL 118 MG PO CAPS: 1.0000 | ORAL_CAPSULE | Freq: Four times a day (QID) | ORAL | 3 refills | Status: DC | PRN

## 2018-08-08 MED ORDER — PROPOFOL 10 MG/ML IV BOLUS
INTRAVENOUS | Status: DC | PRN
  Administered 2018-08-08: 50 mg via INTRAVENOUS
  Administered 2018-08-08: 150 mg via INTRAVENOUS

## 2018-08-08 MED ORDER — FENTANYL CITRATE (PF) 100 MCG/2ML IJ SOLN
INTRAMUSCULAR | Status: DC | PRN
  Administered 2018-08-08 (×2): 25 ug via INTRAVENOUS

## 2018-08-08 MED ORDER — PROMETHAZINE HCL 25 MG/ML IJ SOLN: 6.2500 mg | INTRAMUSCULAR | Status: DC | PRN

## 2018-08-08 MED ORDER — CIPROFLOXACIN IN D5W 400 MG/200ML IV SOLN
INTRAVENOUS | Status: AC
  Filled 2018-08-08: qty 200

## 2018-08-08 MED ORDER — SODIUM CHLORIDE 0.9 % IV SOLN
INTRAVENOUS | Status: DC
  Administered 2018-08-08: 15:00:00 via INTRAVENOUS

## 2018-08-08 MED ORDER — DEXAMETHASONE SODIUM PHOSPHATE 10 MG/ML IJ SOLN
INTRAMUSCULAR | Status: DC | PRN
  Administered 2018-08-08: 10 mg via INTRAVENOUS

## 2018-08-08 MED ORDER — LIDOCAINE HCL (PF) 2 % IJ SOLN
INTRAMUSCULAR | Status: AC
  Filled 2018-08-08: qty 10

## 2018-08-08 MED ORDER — BELLADONNA ALKALOIDS-OPIUM 16.2-60 MG RE SUPP
RECTAL | Status: AC
  Filled 2018-08-08: qty 1

## 2018-08-08 MED ORDER — LIDOCAINE HCL (CARDIAC) PF 100 MG/5ML IV SOSY
PREFILLED_SYRINGE | INTRAVENOUS | Status: DC | PRN
  Administered 2018-08-08: 60 mg via INTRAVENOUS

## 2018-08-08 MED ORDER — FENTANYL CITRATE (PF) 100 MCG/2ML IJ SOLN: 25.0000 ug | INTRAMUSCULAR | Status: DC | PRN

## 2018-08-08 SURGICAL SUPPLY — 13 items
BAG DRAIN CYSTO-URO LG1000N (MISCELLANEOUS) ×2 IMPLANT
GLOVE BIO SURGEON STRL SZ7.5 (GLOVE) ×2 IMPLANT
GOWN STRL REUS W/ TWL LRG LVL3 (GOWN DISPOSABLE) ×1 IMPLANT
GOWN STRL REUS W/ TWL XL LVL3 (GOWN DISPOSABLE) ×1 IMPLANT
GOWN STRL REUS W/TWL LRG LVL3 (GOWN DISPOSABLE) ×1
GOWN STRL REUS W/TWL XL LVL3 (GOWN DISPOSABLE) ×1
KIT TURNOVER CYSTO (KITS) ×2 IMPLANT
PACK CYSTO AR (MISCELLANEOUS) ×2 IMPLANT
SET CYSTO W/LG BORE CLAMP LF (SET/KITS/TRAYS/PACK) ×2 IMPLANT
SURGILUBE 2OZ TUBE FLIPTOP (MISCELLANEOUS) ×2 IMPLANT
SYSTEM UROLIFT (Male Continence) ×7 IMPLANT
WATER STERILE IRR 1000ML POUR (IV SOLUTION) ×2 IMPLANT
WATER STERILE IRR 3000ML UROMA (IV SOLUTION) ×2 IMPLANT

## 2018-08-08 NOTE — Progress Notes (Signed)
Patient given ginger ale to drink 

## 2018-08-08 NOTE — Progress Notes (Signed)
Patient given urinal, felt the need to void.

## 2018-08-08 NOTE — OR Nursing (Signed)
Pt placed on nasal oxygen 2.5 liters/min

## 2018-08-08 NOTE — Progress Notes (Signed)
VOIDED 200CC BLOODY URINE

## 2018-08-08 NOTE — Anesthesia Post-op Follow-up Note (Signed)
Anesthesia QCDR form completed.        

## 2018-08-08 NOTE — Transfer of Care (Signed)
Immediate Anesthesia Transfer of Care Note  Patient: Jose Ray  Procedure(s) Performed: CYSTOSCOPY WITH INSERTION OF UROLIFT (N/A Prostate)  Patient Location: PACU  Anesthesia Type:General  Level of Consciousness: awake and alert   Airway & Oxygen Therapy: Patient Spontanous Breathing and Patient connected to face mask oxygen  Post-op Assessment: Report given to RN and Post -op Vital signs reviewed and stable  Post vital signs: Reviewed  Last Vitals:  Vitals Value Taken Time  BP 141/72 08/08/2018  4:28 PM  Temp    Pulse 71 08/08/2018  4:28 PM  Resp 15 08/08/2018  4:28 PM  SpO2 99 % 08/08/2018  4:28 PM    Last Pain:  Vitals:   08/08/18 1435  TempSrc: Temporal  PainSc: 0-No pain      Patients Stated Pain Goal: 0 (08/08/18 1435)  Complications: No apparent anesthesia complications

## 2018-08-08 NOTE — Discharge Instructions (Signed)
Dysuria Dysuria is pain or discomfort while urinating. The pain or discomfort may be felt in the tube that carries urine out of the bladder (urethra) or in the surrounding tissue of the genitals. The pain may also be felt in the groin area, lower abdomen, and lower back. You may have to urinate frequently or have the sudden feeling that you have to urinate (urgency). Dysuria can affect both men and women, but is more common in women. Dysuria can be caused by many different things, including:  Urinary tract infection in women.  Infection of the kidney or bladder.  Kidney stones or bladder stones.  Certain sexually transmitted infections (STIs), such as chlamydia.  Dehydration.  Inflammation of the vagina.  Use of certain medicines.  Use of certain soaps or scented products that cause irritation.  Follow these instructions at home: Watch your dysuria for any changes. The following actions may help to reduce any discomfort you are feeling:  Drink enough fluid to keep your urine clear or pale yellow.  Empty your bladder often. Avoid holding urine for long periods of time.  After a bowel movement or urination, women should cleanse from front to back, using each tissue only once.  Empty your bladder after sexual intercourse.  Take medicines only as directed by your health care provider.  If you were prescribed an antibiotic medicine, finish it all even if you start to feel better.  Avoid caffeine, tea, and alcohol. They can irritate the bladder and make dysuria worse. In men, alcohol may irritate the prostate.  Keep all follow-up visits as directed by your health care provider. This is important.  If you had any tests done to find the cause of dysuria, it is your responsibility to obtain your test results. Ask the lab or department performing the test when and how you will get your results. Talk with your health care provider if you have any questions about your results.  Contact a  health care provider if:  You develop pain in your back or sides.  You have a fever.  You have nausea or vomiting.  You have blood in your urine.  You are not urinating as often as you usually do. Get help right away if:  You pain is severe and not relieved with medicines.  You are unable to hold down any fluids.  You or someone else notices a change in your mental function.  You have a rapid heartbeat at rest.  You have shaking or chills.  You feel extremely weak. This information is not intended to replace advice given to you by your health care provider. Make sure you discuss any questions you have with your health care provider. Document Released: 06/25/2004 Document Revised: 03/04/2016 Document Reviewed: 05/23/2014 Elsevier Interactive Patient Education  2018 [employer] Benign Prostatic Hyperplasia Benign prostatic hyperplasia (BPH) is an enlarged prostate gland that is caused by the normal aging process and not by cancer. The prostate is a walnut-sized gland that is involved in the production of semen. It is located in front of the rectum and below the bladder. The bladder stores urine and the urethra is the tube that carries the urine out of the body. The prostate may get bigger as a man gets older. An enlarged prostate can press on the urethra. This can make it harder to pass urine. The build-up of urine in the bladder can cause infection. Back pressure and infection may progress to bladder damage and kidney (renal) failure. What are the causes?  This condition is part of a normal aging process. However, not all men develop problems from this condition. If the prostate enlarges away from the urethra, urine flow will not be blocked. If it enlarges toward the urethra and compresses it, there will be problems passing urine. What increases the risk? This condition is more likely to develop in men over the age of 34 years. What are the signs or symptoms? Symptoms of this  condition include:  Getting up often during the night to urinate.  Needing to urinate frequently during the day.  Difficulty starting urine flow.  Decrease in size and strength of your urine stream.  Leaking (dribbling) after urinating.  Inability to pass urine. This needs immediate treatment.  Inability to completely empty your bladder.  Pain when you pass urine. This is more common if there is also an infection.  Urinary tract infection (UTI).  How is this diagnosed? This condition is diagnosed based on your medical history, a physical exam, and your symptoms. Tests will also be done, such as:  A post-void bladder scan. This measures any amount of urine that may remain in your bladder after you finish urinating.  A digital rectal exam. In a rectal exam, your health care provider checks your prostate by putting a lubricated, gloved finger into your rectum to feel the back of your prostate gland. This exam detects the size of your gland and any abnormal lumps or growths.  An exam of your urine (urinalysis).  A prostate specific antigen (PSA) screening. This is a blood test used to screen for prostate cancer.  An ultrasound. This test uses sound waves to electronically produce a picture of your prostate gland.  Your health care provider may refer you to a specialist in kidney and prostate diseases (urologist). How is this treated? Once symptoms begin, your health care provider will monitor your condition (active surveillance or watchful waiting). Treatment for this condition will depend on the severity of your condition. Treatment may include:  Observation and yearly exams. This may be the only treatment needed if your condition and symptoms are mild.  Medicines to relieve your symptoms, including: ? Medicines to shrink the prostate. ? Medicines to relax the muscle of the prostate.  Surgery in severe cases. Surgery may include: ? Prostatectomy. In this procedure, the  prostate tissue is removed completely through an open incision or with a laparascope or robotics. ? Transurethral resection of the prostate (TURP). In this procedure, a tool is inserted through the opening at the tip of the penis (urethra). It is used to cut away tissue of the inner core of the prostate. The pieces are removed through the same opening of the penis. This removes the blockage. ? Transurethral incision (TUIP). In this procedure, small cuts are made in the prostate. This lessens the prostate's pressure on the urethra. ? Transurethral microwave thermotherapy (TUMT). This procedure uses microwaves to create heat. The heat destroys and removes a small amount of prostate tissue. ? Transurethral needle ablation (TUNA). This procedure uses radio frequencies to destroy and remove a small amount of prostate tissue. ? Interstitial laser coagulation ([place]). This procedure uses a laser to destroy and remove a small amount of prostate tissue. ? Transurethral electrovaporization (TUVP). This procedure uses electrodes to destroy and remove a small amount of prostate tissue. ? Prostatic urethral lift. This procedure inserts an implant to push the lobes of the prostate away from the urethra.  Follow these instructions at home:  Take over-the-counter and prescription medicines  only as told by your health care provider.  Monitor your symptoms for any changes. Contact your health care provider with any changes.  Avoid drinking large amounts of liquid before going to bed or out in public.  Avoid or reduce how much caffeine or alcohol you drink.  Give yourself time when you urinate.  Keep all follow-up visits as told by your health care provider. This is important. Contact a health care provider if:  You have unexplained back pain.  Your symptoms do not get better with treatment.  You develop side effects from the medicine you are taking.  Your urine becomes very dark or has a bad smell.  Your  lower abdomen becomes distended and you have trouble passing your urine. Get help right away if:  You have a fever or chills.  You suddenly cannot urinate.  You feel lightheaded, or very dizzy, or you faint.  There are large amounts of blood or clots in the urine.  Your urinary problems become hard to manage.  You develop moderate to severe low back or flank pain. The flank is the side of your body between the ribs and the hip. These symptoms may represent a serious problem that is an emergency. Do not wait to see if the symptoms will go away. Get medical help right away. Call your local emergency services (911 in the [place] Do not drive yourself to the hospital. Summary  Benign prostatic hyperplasia (BPH) is an enlarged prostate that is caused by the normal aging process and not by cancer.  An enlarged prostate can press on the urethra. This can make it hard to pass urine.  This condition is part of a normal aging process and is more likely to develop in men over the age of 50 years.  Get help right away if you suddenly cannot urinate. This information is not intended to replace advice given to you by your health care provider. Make sure you discuss any questions you have with your health care provider. Document Released: 09/27/2005 Document Revised: 11/01/2016 Document Reviewed: 11/01/2016 Elsevier Interactive Patient Education  [employer].

## 2018-08-08 NOTE — Op Note (Signed)
Preoperative diagnosis: BPH with bladder outlet obstruction  Postoperative diagnosis: Same  Procedure: UroLift implant (7 implants)  Surgeon: Suszanne Conners. Evelene Croon MD  Anesthesia: General  Indications:See the history and physical. After informed consent the above procedure(s) were requested     Technique and findings: After adequate general anesthesia had been obtained she was placed into dorsal lithotomy position and the perineum was prepped and draped in the usual fashion.  Cystoscope was coupled to the camera and then advanced into the bladder.  Bladder was moderately trabeculated.  No bladder tumors were identified.  Both ureteral orifices were identified and had clear efflux.  The patient had lateral lobe prostatic hypertrophy with a prostatic urethral length of approximately 4 cm.  The initial pair of UroLift implants were placed at the 3:00 and 9:00 positions 1.5 to 2 cm proximal to the bladder neck.  There was noted to be good separation of the bladder neck at this point.  The second pair of implants were placed at the 3:00 and 9 o'clock position at the level of the verumontanum.  Section of the prostatic throat indicated that there was still some visual obstruction in the mid prostate gland.  Therefore a third pair of implants were stacked between the initial sets of implants the mid prostatic urethra.  Cystoscopy was then performed and there was still some slight bulging of the prostate at mid prostatic urethra and 1/7 implant placed here at the 9 o'clock position.  Section of the prostatic urethra revealed a widely open channel any injury to the urethra.  Cystoscope was then removed and 10 cc of viscous Xylocaine instilled within the urethra.  Procedure was then terminated and patient transferred to the recovery room in stable condition.

## 2018-08-08 NOTE — H&P (Signed)
Date of Initial H&P: 08/02/18  History reviewed, patient examined, no change in status, stable for surgery. 

## 2018-08-08 NOTE — Anesthesia Procedure Notes (Signed)
Procedure Name: Intubation Performed by: Lance Muss, CRNA Pre-anesthesia Checklist: Patient identified, Patient being monitored, Timeout performed, Emergency Drugs available and Suction available Patient Re-evaluated:Patient Re-evaluated prior to induction Oxygen Delivery Method: Circle system utilized Preoxygenation: Pre-oxygenation with 100% oxygen Induction Type: IV induction Ventilation: Mask ventilation without difficulty Laryngoscope Size: Mac and 3 Grade View: Grade I Tube type: Oral Tube size: 7.5 mm Number of attempts: 1 Airway Equipment and Method: Stylet Placement Confirmation: ETT inserted through vocal cords under direct vision,  positive ETCO2 and breath sounds checked- equal and bilateral Secured at: 23 cm Tube secured with: Tape Dental Injury: Teeth and Oropharynx as per pre-operative assessment  Comments: Attempted LMA placement with 4.0 and 5.0 LMA. Unable to seat, leak noted. Decision to convert to GETA by Dr. Kayleen Memos.

## 2018-08-08 NOTE — Anesthesia Preprocedure Evaluation (Signed)
Anesthesia Evaluation  Patient identified by MRN, date of birth, ID band Patient awake    Reviewed: Allergy & Precautions, NPO status , Patient's Chart, lab work & pertinent test results  History of Anesthesia Complications Negative for: history of anesthetic complications  Airway Mallampati: II  TM Distance: >3 FB Neck ROM: Full    Dental no notable dental hx.    Pulmonary COPD,  oxygen dependent, former smoker,    breath sounds clear to auscultation- rhonchi (-) wheezing      Cardiovascular hypertension, + CAD, + Past MI, + Cardiac Stents (2002) and +CHF  (-) CABG  Rhythm:Regular Rate:Normal - Systolic murmurs and - Diastolic murmurs Echo 10/25/16: - Left ventricle: The cavity size was mildly dilated. Systolic   function was normal. The estimated ejection fraction was in the   range of 55% to 60%. Hypokinesis of the apical myocardium.   Akinesis of the apical myocardium. - Aortic valve: Valve area (Vmax): 2.38 cm^2. - Mitral valve: There was mild regurgitation.   Neuro/Psych negative neurological ROS  negative psych ROS   GI/Hepatic Neg liver ROS, hiatal hernia, GERD  ,  Endo/Other  diabetes, Oral Hypoglycemic Agents  Renal/GU negative Renal ROS     Musculoskeletal negative musculoskeletal ROS (+)   Abdominal (+) - obese,   Peds  Hematology negative hematology ROS (+)   Anesthesia Other Findings Past Medical History: No date: Acute MI (HCC) No date: CHF (congestive heart failure) (HCC) No date: COPD (chronic obstructive pulmonary disease) (HCC) No date: Coronary artery disease No date: Diabetes mellitus without complication (HCC) No date: GERD (gastroesophageal reflux disease) No date: History of hiatal hernia No date: Hypertension   Reproductive/Obstetrics                             Anesthesia Physical Anesthesia Plan  ASA: IV  Anesthesia Plan: General   Post-op Pain  Management:    Induction: Intravenous  PONV Risk Score and Plan: 1 and Ondansetron  Airway Management Planned: LMA  Additional Equipment:   Intra-op Plan:   Post-operative Plan:   Informed Consent: I have reviewed the patients History and Physical, chart, labs and discussed the procedure including the risks, benefits and alternatives for the proposed anesthesia with the patient or authorized representative who has indicated his/her understanding and acceptance.   Dental advisory given  Plan Discussed with: CRNA and Anesthesiologist  Anesthesia Plan Comments:         Anesthesia Quick Evaluation

## 2018-08-09 ENCOUNTER — Encounter: Payer: Self-pay | Admitting: Urology

## 2018-08-09 NOTE — Anesthesia Postprocedure Evaluation (Signed)
Anesthesia Post Note  Patient: Jose Ray  Procedure(s) Performed: CYSTOSCOPY WITH INSERTION OF UROLIFT (N/A Prostate)  Patient location during evaluation: PACU Anesthesia Type: General Level of consciousness: awake and alert and oriented Pain management: pain level controlled Vital Signs Assessment: post-procedure vital signs reviewed and stable Respiratory status: spontaneous breathing Cardiovascular status: blood pressure returned to baseline Anesthetic complications: no     Last Vitals:  Vitals:   08/08/18 1729 08/08/18 1755  BP: 133/69 132/68  Pulse: 72 70  Resp: 16   Temp: (!) 35.8 C   SpO2: 92% 93%    Last Pain:  Vitals:   08/08/18 1755  TempSrc:   PainSc: 0-No pain                 Arkeem Harts

## 2021-01-30 ENCOUNTER — Other Ambulatory Visit: Payer: Self-pay

## 2021-01-30 ENCOUNTER — Other Ambulatory Visit
Admission: RE | Admit: 2021-01-30 | Discharge: 2021-01-30 | Disposition: A | Discharge status: Home / Self Care | Payer: Medicare Other | Source: Ambulatory Visit | Attending: Internal Medicine | Admitting: Internal Medicine

## 2021-01-30 DIAGNOSIS — Z01812 Encounter for preprocedural laboratory examination: Secondary | ICD-10-CM | POA: Insufficient documentation

## 2021-01-30 DIAGNOSIS — Z20822 Contact with and (suspected) exposure to covid-19: Secondary | ICD-10-CM | POA: Insufficient documentation

## 2021-01-31 LAB — SARS CORONAVIRUS 2 (TAT 6-24 HRS): SARS Coronavirus 2: NEGATIVE

## 2021-02-03 ENCOUNTER — Ambulatory Visit
Admission: RE | Admit: 2021-02-03 | Discharge: 2021-02-03 | Disposition: A | Discharge status: Home / Self Care | Payer: Medicare Other | Attending: Internal Medicine | Admitting: Internal Medicine

## 2021-02-03 ENCOUNTER — Encounter
Admission: RE | Disposition: A | Discharge status: Home / Self Care | Payer: Self-pay | Source: Home / Self Care | Attending: Internal Medicine

## 2021-02-03 ENCOUNTER — Other Ambulatory Visit: Payer: Self-pay

## 2021-02-03 ENCOUNTER — Encounter: Payer: Self-pay | Admitting: Internal Medicine

## 2021-02-03 DIAGNOSIS — R079 Chest pain, unspecified: Secondary | ICD-10-CM | POA: Insufficient documentation

## 2021-02-03 DIAGNOSIS — I10 Essential (primary) hypertension: Secondary | ICD-10-CM | POA: Diagnosis not present

## 2021-02-03 DIAGNOSIS — E785 Hyperlipidemia, unspecified: Secondary | ICD-10-CM | POA: Diagnosis not present

## 2021-02-03 DIAGNOSIS — I2510 Atherosclerotic heart disease of native coronary artery without angina pectoris: Secondary | ICD-10-CM | POA: Insufficient documentation

## 2021-02-03 DIAGNOSIS — Z955 Presence of coronary angioplasty implant and graft: Secondary | ICD-10-CM | POA: Insufficient documentation

## 2021-02-03 HISTORY — PX: LEFT HEART CATH AND CORONARY ANGIOGRAPHY: CATH118249

## 2021-02-03 LAB — GLUCOSE, CAPILLARY: Glucose-Capillary: 144 mg/dL — ABNORMAL HIGH (ref 70–99)

## 2021-02-03 SURGERY — LEFT HEART CATH AND CORONARY ANGIOGRAPHY
Anesthesia: Moderate Sedation | Laterality: Left

## 2021-02-03 MED ORDER — VERAPAMIL HCL 2.5 MG/ML IV SOLN
INTRAVENOUS | Status: AC
  Filled 2021-02-03: qty 2

## 2021-02-03 MED ORDER — FENTANYL CITRATE (PF) 100 MCG/2ML IJ SOLN
INTRAMUSCULAR | Status: AC
  Filled 2021-02-03: qty 2

## 2021-02-03 MED ORDER — SODIUM CHLORIDE 0.9 % WEIGHT BASED INFUSION
3.0000 mL/kg/h | INTRAVENOUS | Status: DC
  Administered 2021-02-03: 300 mL/h via INTRAVENOUS

## 2021-02-03 MED ORDER — SODIUM CHLORIDE 0.9% FLUSH: 3.0000 mL | INTRAVENOUS | Status: DC | PRN

## 2021-02-03 MED ORDER — SODIUM CHLORIDE 0.9 % WEIGHT BASED INFUSION: 1.0000 mL/kg/h | INTRAVENOUS | Status: DC

## 2021-02-03 MED ORDER — SODIUM CHLORIDE 0.9 % WEIGHT BASED INFUSION: 3.0000 mL/kg/h | INTRAVENOUS | Status: AC

## 2021-02-03 MED ORDER — MIDAZOLAM HCL 2 MG/2ML IJ SOLN
INTRAMUSCULAR | Status: DC | PRN
  Administered 2021-02-03: 0.5 mg via INTRAVENOUS

## 2021-02-03 MED ORDER — ASPIRIN 81 MG PO CHEW
81.0000 mg | CHEWABLE_TABLET | ORAL | Status: AC
  Administered 2021-02-03: 81 mg via ORAL

## 2021-02-03 MED ORDER — MIDAZOLAM HCL 2 MG/2ML IJ SOLN
INTRAMUSCULAR | Status: AC
  Filled 2021-02-03: qty 2

## 2021-02-03 MED ORDER — SODIUM CHLORIDE 0.9 % IV SOLN: 250.0000 mL | INTRAVENOUS | Status: DC | PRN

## 2021-02-03 MED ORDER — FENTANYL CITRATE (PF) 100 MCG/2ML IJ SOLN
INTRAMUSCULAR | Status: DC | PRN
  Administered 2021-02-03: 12.5 ug via INTRAVENOUS

## 2021-02-03 MED ORDER — IOHEXOL 300 MG/ML  SOLN
INTRAMUSCULAR | Status: DC | PRN
Start: 2021-02-03 — End: 2021-02-03
  Administered 2021-02-03: 95 mL

## 2021-02-03 MED ORDER — HEPARIN SODIUM (PORCINE) 1000 UNIT/ML IJ SOLN
INTRAMUSCULAR | Status: AC
  Filled 2021-02-03: qty 1

## 2021-02-03 MED ORDER — HEPARIN (PORCINE) IN NACL 1000-0.9 UT/500ML-% IV SOLN
INTRAVENOUS | Status: AC
  Filled 2021-02-03: qty 1000

## 2021-02-03 MED ORDER — ASPIRIN 81 MG PO CHEW
CHEWABLE_TABLET | ORAL | Status: AC
  Filled 2021-02-03: qty 1

## 2021-02-03 MED ORDER — HEPARIN SODIUM (PORCINE) 1000 UNIT/ML IJ SOLN
INTRAMUSCULAR | Status: DC | PRN
  Administered 2021-02-03: 5000 [IU] via INTRAVENOUS

## 2021-02-03 MED ORDER — HEPARIN (PORCINE) IN NACL 1000-0.9 UT/500ML-% IV SOLN
INTRAVENOUS | Status: DC | PRN
  Administered 2021-02-03: 1000 mL

## 2021-02-03 MED ORDER — SODIUM CHLORIDE 0.9% FLUSH: 3.0000 mL | Freq: Two times a day (BID) | INTRAVENOUS | Status: DC

## 2021-02-03 MED ORDER — VERAPAMIL HCL 2.5 MG/ML IV SOLN
INTRAVENOUS | Status: DC | PRN
Start: 2021-02-03 — End: 2021-02-03
  Administered 2021-02-03: 2.5 mg via INTRA_ARTERIAL

## 2021-02-03 SURGICAL SUPPLY — 13 items
CATH AMP RT 5F (CATHETERS) ×2 IMPLANT
CATH INFINITI 5 FR 3DRC (CATHETERS) ×2 IMPLANT
CATH INFINITI 5 FR JL3.5 (CATHETERS) ×2 IMPLANT
CATH INFINITI JR4 5F (CATHETERS) ×2 IMPLANT
DEVICE RAD TR BAND REGULAR (VASCULAR PRODUCTS) ×2 IMPLANT
DRAPE BRACHIAL (DRAPES) ×2 IMPLANT
GLIDESHEATH SLEND SS 6F .021 (SHEATH) ×2 IMPLANT
GUIDEWIRE INQWIRE 1.5J.035X260 (WIRE) ×1 IMPLANT
INQWIRE 1.5J .035X260CM (WIRE) ×2
PACK CARDIAC CATH (CUSTOM PROCEDURE TRAY) ×2 IMPLANT
PROTECTION STATION PRESSURIZED (MISCELLANEOUS) ×2
SET ATX SIMPLICITY (MISCELLANEOUS) ×2 IMPLANT
STATION PROTECTION PRESSURIZED (MISCELLANEOUS) ×1 IMPLANT

## 2021-02-04 ENCOUNTER — Encounter: Payer: Self-pay | Admitting: Internal Medicine

## 2021-03-23 ENCOUNTER — Other Ambulatory Visit: Payer: Self-pay

## 2021-03-23 ENCOUNTER — Encounter: Payer: Medicare Other | Attending: Internal Medicine

## 2021-03-23 DIAGNOSIS — I5022 Chronic systolic (congestive) heart failure: Secondary | ICD-10-CM

## 2021-03-23 NOTE — Progress Notes (Signed)
Virtual Visit completed. Patient informed on EP and RD appointment and 6 Minute walk test. Patient also informed of patient health questionnaires on My Chart. Patient Verbalizes understanding. Visit diagnosis can be found in [facility] 12/02/20.

## 2021-03-30 ENCOUNTER — Other Ambulatory Visit: Payer: Self-pay

## 2021-03-30 VITALS — Ht 71.5 in | Wt 213.5 lb

## 2021-03-30 DIAGNOSIS — I5022 Chronic systolic (congestive) heart failure: Secondary | ICD-10-CM

## 2021-03-30 DIAGNOSIS — I5020 Unspecified systolic (congestive) heart failure: Secondary | ICD-10-CM | POA: Diagnosis present

## 2021-03-30 NOTE — Patient Instructions (Addendum)
Patient Instructions  Patient Details  Name: Jose Ray MRN: [id number] Date of Birth: 22-Oct-1940 Referring Provider:  Lamar Blinks, MD  Below are your personal goals for exercise, nutrition, and risk factors. Our goal is to help you stay on track towards obtaining and maintaining these goals. We will be discussing your progress on these goals with you throughout the program.  Initial Exercise Prescription:  Initial Exercise Prescription - 03/30/21 1500       Date of Initial Exercise RX and Referring Provider   Date 03/30/21    Referring Provider Gwen Pounds      Oxygen   Oxygen Continuous    Liters 3      Treadmill   MPH 1.3    Grade 0    Minutes 15    METs 2      Recumbant Bike   Level 1    RPM 60    Minutes 15    METs 2      NuStep   Level 1    SPM 80    Minutes 15    METs 2      REL-XR   Level 1    Speed 50    Minutes 15    METs 2      T5 Nustep   Level 1    SPM 50    Minutes 15    METs 2      Prescription Details   Frequency (times per week) 3    Duration Progress to 30 minutes of continuous aerobic without signs/symptoms of physical distress      Intensity   THRR 40-80% of Max Heartrate 113-132    Ratings of Perceived Exertion 11-13    Perceived Dyspnea 0-4      Progression   Progression Continue to progress workloads to maintain intensity without signs/symptoms of physical distress.      Resistance Training   Training Prescription Yes    Weight 3 lb    Reps 10-15             Exercise Goals: Frequency: Be able to perform aerobic exercise two to three times per week in program working toward 2-5 days per week of home exercise.  Intensity: Work with a perceived exertion of 11 (fairly light) - 15 (hard) while following your exercise prescription.  We will make changes to your prescription with you as you progress through the program.   Duration: Be able to do 30 to 45 minutes of continuous aerobic exercise in addition to a 5  minute warm-up and a 5 minute cool-down routine.   Nutrition Goals: Your personal nutrition goals will be established when you do your nutrition analysis with the dietician.  The following are general nutrition guidelines to follow: Cholesterol < 200mg /day Sodium < 1500mg /day Fiber: Men over 50 yrs - 30 grams per day  Personal Goals:  Personal Goals and Risk Factors at Admission - 03/30/21 1614       Core Components/Risk Factors/Patient Goals on Admission    Weight Management Yes;Weight Maintenance;Weight Loss    Intervention Weight Management: Develop a combined nutrition and exercise program designed to reach desired caloric intake, while maintaining appropriate intake of nutrient and fiber, sodium and fats, and appropriate energy expenditure required for the weight goal.;Weight Management: Provide education and appropriate resources to help participant work on and attain dietary goals.;Weight Management/Obesity: Establish reasonable short term and long term weight goals.    Expected Outcomes Short Term: Continue to assess and modify  interventions until short term weight is achieved;Long Term: Adherence to nutrition and physical activity/exercise program aimed toward attainment of established weight goal;Understanding recommendations for meals to include 15-35% energy as protein, 25-35% energy from fat, 35-60% energy from carbohydrates, less than 200mg  of dietary cholesterol, 20-35 gm of total fiber daily;Understanding of distribution of calorie intake throughout the day with the consumption of 4-5 meals/snacks;Weight Loss: Understanding of general recommendations for a balanced deficit meal plan, which promotes 1-2 lb weight loss per week and includes a negative energy balance of [phone] kcal/d    Improve shortness of breath with ADL's Yes    Intervention Provide education, individualized exercise plan and daily activity instruction to help decrease symptoms of SOB with activities of daily  living.    Expected Outcomes Short Term: Improve cardiorespiratory fitness to achieve a reduction of symptoms when performing ADLs;Long Term: Be able to perform more ADLs without symptoms or delay the onset of symptoms    Diabetes Yes    Intervention Provide education about signs/symptoms and action to take for hypo/hyperglycemia.;Provide education about proper nutrition, including hydration, and aerobic/resistive exercise prescription along with prescribed medications to achieve blood glucose in normal ranges: Fasting glucose 65-99 mg/dL    Expected Outcomes Short Term: Participant verbalizes understanding of the signs/symptoms and immediate care of hyper/hypoglycemia, proper foot care and importance of medication, aerobic/resistive exercise and nutrition plan for blood glucose control.;Long Term: Attainment of HbA1C < 7%.    Heart Failure Yes    Intervention Provide a combined exercise and nutrition program that is supplemented with education, support and counseling about heart failure. Directed toward relieving symptoms such as shortness of breath, decreased exercise tolerance, and extremity edema.    Expected Outcomes Improve functional capacity of life;Short term: Attendance in program 2-3 days a week with increased exercise capacity. Reported lower sodium intake. Reported increased fruit and vegetable intake. Reports medication compliance.;Short term: Daily weights obtained and reported for increase. Utilizing diuretic protocols set by physician.;Long term: Adoption of self-care skills and reduction of barriers for early signs and symptoms recognition and intervention leading to self-care maintenance.    Hypertension Yes    Intervention Provide education on lifestyle modifcations including regular physical activity/exercise, weight management, moderate sodium restriction and increased consumption of fresh fruit, vegetables, and low fat dairy, alcohol moderation, and smoking cessation.;Monitor  prescription use compliance.    Expected Outcomes Short Term: Continued assessment and intervention until BP is < 140/77mm HG in hypertensive participants. < 130/40mm HG in hypertensive participants with diabetes, heart failure or chronic kidney disease.;Long Term: Maintenance of blood pressure at goal levels.    Lipids Yes    Intervention Provide education and support for participant on nutrition & aerobic/resistive exercise along with prescribed medications to achieve LDL 70mg , HDL >40mg .    Expected Outcomes Short Term: Participant states understanding of desired cholesterol values and is compliant with medications prescribed. Participant is following exercise prescription and nutrition guidelines.;Long Term: Cholesterol controlled with medications as prescribed, with individualized exercise RX and with personalized nutrition plan. Value goals: LDL < 70mg , HDL > 40 mg.             Tobacco Use Initial Evaluation: Social History   Tobacco Use  Smoking Status Former   Packs/day: 1.00   Years: 35.00   Pack years: 35.00   Types: Cigarettes   Quit date: 1995   Years since quitting: 27.4  Smokeless Tobacco Never    Exercise Goals and Review:  Exercise Goals     Row  Name 03/30/21 1617             Exercise Goals   Increase Physical Activity Yes       Intervention Provide advice, education, support and counseling about physical activity/exercise needs.;Develop an individualized exercise prescription for aerobic and resistive training based on initial evaluation findings, risk stratification, comorbidities and participant's personal goals.       Expected Outcomes Short Term: Attend rehab on a regular basis to increase amount of physical activity.;Long Term: Add in home exercise to make exercise part of routine and to increase amount of physical activity.;Long Term: Exercising regularly at least 3-5 days a week.       Increase Strength and Stamina Yes       Intervention Provide  advice, education, support and counseling about physical activity/exercise needs.;Develop an individualized exercise prescription for aerobic and resistive training based on initial evaluation findings, risk stratification, comorbidities and participant's personal goals.       Expected Outcomes Short Term: Increase workloads from initial exercise prescription for resistance, speed, and METs.;Short Term: Perform resistance training exercises routinely during rehab and add in resistance training at home;Long Term: Improve cardiorespiratory fitness, muscular endurance and strength as measured by increased METs and functional capacity ( )       Able to understand and use rate of perceived exertion (RPE) scale Yes       Intervention Provide education and explanation on how to use RPE scale       Expected Outcomes Short Term: Able to use RPE daily in rehab to express subjective intensity level;Long Term:  Able to use RPE to guide intensity level when exercising independently       Able to understand and use Dyspnea scale Yes       Intervention Provide education and explanation on how to use Dyspnea scale       Expected Outcomes Short Term: Able to use Dyspnea scale daily in rehab to express subjective sense of shortness of breath during exertion;Long Term: Able to use Dyspnea scale to guide intensity level when exercising independently       Knowledge and understanding of Target Heart Rate Range (THRR) Yes       Intervention Provide education and explanation of THRR including how the numbers were predicted and where they are located for reference       Expected Outcomes Short Term: Able to state/look up THRR;Short Term: Able to use daily as guideline for intensity in rehab;Long Term: Able to use THRR to govern intensity when exercising independently       Able to check pulse independently Yes       Intervention Provide education and demonstration on how to check pulse in carotid and radial arteries.;Review  the importance of being able to check your own pulse for safety during independent exercise       Expected Outcomes Short Term: Able to explain why pulse checking is important during independent exercise;Long Term: Able to check pulse independently and accurately       Understanding of Exercise Prescription Yes       Intervention Provide education, explanation, and written materials on patient's individual exercise prescription       Expected Outcomes Short Term: Able to explain program exercise prescription;Long Term: Able to explain home exercise prescription to exercise independently                Copy of goals given to participant.

## 2021-03-30 NOTE — Progress Notes (Signed)
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 (Patient not taking: Reported on 03/23/2021), Disp: , Rfl:    atorvastatin (LIPITOR) 80 MG tablet, Take 1 tablet by mouth at bedtime., Disp: , Rfl:    famotidine (PEPCID) 10 MG tablet, Take 10 mg by mouth daily., Disp: , Rfl:    fexofenadine (ALLEGRA) 180 MG tablet, Take 180 mg by mouth at bedtime., Disp: , Rfl:    fluticasone (FLONASE) 50 MCG/ACT nasal spray, Place 2 sprays into both nostrils at bedtime., Disp: , Rfl:    fluticasone-salmeterol (ADVAIR) 250-50 MCG/ACT AEPB, Advair Diskus 250 mcg-50 mcg/dose powder for inhalation (Patient not taking: Reported on 03/23/2021), Disp: , Rfl:    Fluticasone-Salmeterol (ADVAIR) 250-50 MCG/DOSE AEPB, Inhale 1 puff into the lungs 2 (two) times daily., Disp: , Rfl:    glipiZIDE (GLUCOTROL) 10 MG tablet, Take 10 mg by mouth daily before breakfast., Disp: , Rfl:    hydrochlorothiazide (HYDRODIURIL) 25 MG tablet, Take 25 mg by mouth daily., Disp: , Rfl:    hydrochlorothiazide (HYDRODIURIL) 25 MG tablet, Take 1 tablet by mouth daily. (Patient not taking: Reported on 03/23/2021), Disp: , Rfl:    icosapent Ethyl (VASCEPA) 1 g capsule, Take 2 g by mouth 2 (two) times daily., Disp: , Rfl:    isosorbide mononitrate (IMDUR) 30 MG 24 hr tablet, Take 30 mg by mouth daily., Disp: , Rfl:    losartan (COZAAR) 50 MG  tablet, Take 100 mg by mouth daily., Disp: , Rfl:    metFORMIN (GLUCOPHAGE) 1000 MG tablet, Take 1,000 mg by mouth 2 (two) times daily., Disp: , Rfl:    metoprolol succinate (TOPROL-XL) 100 MG 24 hr tablet, Take 50 mg by mouth daily. Take with or immediately following a meal. (Patient not taking: Reported on 03/23/2021), Disp: , Rfl:    metoprolol succinate (TOPROL-XL) 25 MG 24 hr tablet, Take 1 tablet by mouth daily., Disp: , Rfl:    Multiple Vitamins-Minerals (PRESERVISION AREDS 2) CAPS, Take 1 capsule by mouth at bedtime. (Patient not taking: Reported on 03/23/2021), Disp: , Rfl:    OXYGEN, Inhale 2.5-3 L into the lungs See admin instructions. Used while sleeping and physical activity, Disp: , Rfl:    Polyethyl Glycol-Propyl Glycol 0.4-0.3 % SOLN, Place 1 drop into both eyes See admin instructions. Instill 1 drop into both eyes at night, may use during the day as needed for dry eyes, Disp: , Rfl:    tiotropium (SPIRIVA) 18 MCG inhalation capsule, Place 18 mcg into inhaler and inhale daily., Disp: , Rfl:   Past Medical History: Past Medical History:  Diagnosis Date   Acute MI ([place])    CHF (congestive heart failure) (HCC)    COPD (chronic obstructive pulmonary disease) (HCC)    Coronary artery disease    Diabetes mellitus without complication (HCC)    GERD (gastroesophageal reflux disease)  History of hiatal hernia    Hypertension     Tobacco Use: Social History   Tobacco Use  Smoking Status Former   Packs/day: 1.00   Years: 35.00   Pack years: 35.00   Types: Cigarettes   Quit date: 1995   Years since quitting: 27.4  Smokeless Tobacco Never    Labs: Recent Review Advice worker     Labs for ITP Cardiac and Pulmonary Rehab Latest Ref Rng & Units 10/25/2016   Hemoglobin A1c 4.8 - 5.6 % 7.0(H)        Pulmonary Assessment Scores:  Pulmonary Assessment Scores     Row Name 03/30/21 1607         ADL UCSD   SOB Score total 60     Rest 1     Walk 3     Stairs 5      Bath 2     Dress 1     Shop 2           CAT Score     CAT Score 22           mMRC Score     mMRC Score 2             UCSD: Self-administered rating of dyspnea associated with activities of daily living (ADLs) 6-point scale (0 = "not at all" to 5 = "maximal or unable to do because of breathlessness")  Scoring Scores range from 0 to 120.  Minimally important difference is 5 units  CAT: CAT can identify the health impairment of COPD patients and is better correlated with disease progression.  CAT has a scoring range of zero to 40. The CAT score is classified into four groups of low (less than 10), medium (10 - 20), high (21-30) and very high (31-40) based on the impact level of disease on health status. A CAT score over 10 suggests significant symptoms.  A worsening CAT score could be explained by an exacerbation, poor medication adherence, poor inhaler technique, or progression of COPD or comorbid conditions.  CAT MCID is 2 points  mMRC: mMRC (Modified Medical Research Council) Dyspnea Scale is used to assess the degree of baseline functional disability in patients of respiratory disease due to dyspnea. No minimal important difference is established. A decrease in score of 1 point or greater is considered a positive change.   Pulmonary Function Assessment:  Pulmonary Function Assessment - 03/23/21 1343       Breath   Shortness of Breath Yes;Limiting activity             Exercise Target Goals: Exercise Program Goal: Individual exercise prescription set using results from initial 6 min walk test and THRR while considering  patient's activity barriers and safety.   Exercise Prescription Goal: Initial exercise prescription builds to 30-45 minutes a day of aerobic activity, 2-3 days per week.  Home exercise guidelines will be given to patient during program as part of exercise prescription that the participant will acknowledge.  Education: Aerobic Exercise: - Group verbal  and visual presentation on the components of exercise prescription. Introduces F.I.T.T principle from ACSM for exercise prescriptions.  Reviews F.I.T.T. principles of aerobic exercise including progression. Written material given at graduation.   Education: Resistance Exercise: - Group verbal and visual presentation on the components of exercise prescription. Introduces F.I.T.T principle from ACSM for exercise prescriptions  Reviews F.I.T.T. principles of resistance exercise including progression. Written material given at graduation.    Education: Exercise & Equipment Safety: -  Individual verbal instruction and demonstration of equipment use and safety with use of the equipment. Flowsheet Row Pulmonary Rehab from 03/30/2021 in [facility] Cardiac and Pulmonary Rehab  Date 03/30/21  Educator AS  Instruction Review Code 1- Verbalizes Understanding       Education: Exercise Physiology & General Exercise Guidelines: - Group verbal and written instruction with models to review the exercise physiology of the cardiovascular system and associated critical values. Provides general exercise guidelines with specific guidelines to those with heart or lung disease.    Education: Flexibility, Balance, Mind/Body Relaxation: - Group verbal and visual presentation with interactive activity on the components of exercise prescription. Introduces F.I.T.T principle from ACSM for exercise prescriptions. Reviews F.I.T.T. principles of flexibility and balance exercise training including progression. Also discusses the mind body connection.  Reviews various relaxation techniques to help reduce and manage stress (i.e. Deep breathing, progressive muscle relaxation, and visualization). Balance handout provided to take home. Written material given at graduation.   Activity Barriers & Risk Stratification:   6 Minute Walk:  6 Minute Walk     Row Name 03/30/21 1554         6 Minute Walk   Phase Initial     Distance  925 feet     Walk Time 6 minutes     # of Rest Breaks 0     MPH 1.75     METS 2     RPE 17     Perceived Dyspnea  3     VO2 Peak 7     Symptoms No     Resting HR 95 bpm     Resting BP 124/54     Resting Oxygen Saturation  88 %     Exercise Oxygen Saturation  during 6 min walk 84 %     Max Ex. HR 108 bpm     Max Ex. BP 160/64     2 Minute Post BP 126/60           Interval HR     1 Minute HR 98     2 Minute HR 97     3 Minute HR 98     4 Minute HR 98     5 Minute HR 105     6 Minute HR 108     2 Minute Post HR 98     Interval Heart Rate? Yes           Interval Oxygen     Interval Oxygen? Yes     Baseline Oxygen Saturation % 88 %     1 Minute Oxygen Saturation % 87 %     1 Minute Liters of Oxygen 3 L     2 Minute Liters of Oxygen 3 L     3 Minute Oxygen Saturation % 88 %     3 Minute Liters of Oxygen 3 L     4 Minute Oxygen Saturation % 85 %     4 Minute Liters of Oxygen 3 L     5 Minute Oxygen Saturation % 85 %     5 Minute Liters of Oxygen 3 L     6 Minute Oxygen Saturation % 84 %     6 Minute Liters of Oxygen 3 L     2 Minute Post Liters of Oxygen 3 L            Oxygen Initial Assessment:  Oxygen Initial Assessment - 03/23/21 1341  Home Oxygen   Home Oxygen Device Home Concentrator;Portable Concentrator;E-Tanks    Sleep Oxygen Prescription Continuous    Liters per minute 3    Home Exercise Oxygen Prescription Continuous    Liters per minute 3    Home Resting Oxygen Prescription None    Liters per minute 0    Compliance with Home Oxygen Use Yes      Initial 6 min Walk   Oxygen Used Continuous    Liters per minute 3      Program Oxygen Prescription   Program Oxygen Prescription Continuous    Liters per minute 3      Intervention   Short Term Goals To learn and exhibit compliance with exercise, home and travel O2 prescription;To learn and understand importance of monitoring SPO2 with pulse oximeter and demonstrate accurate use of the pulse  oximeter.;To learn and understand importance of maintaining oxygen saturations>88%;To learn and demonstrate proper pursed lip breathing techniques or other breathing techniques. ;To learn and demonstrate proper use of respiratory medications    Long  Term Goals Exhibits compliance with exercise, home  and travel O2 prescription;Verbalizes importance of monitoring SPO2 with pulse oximeter and return demonstration;Maintenance of O2 saturations>88%;Exhibits proper breathing techniques, such as pursed lip breathing or other method taught during program session;Compliance with respiratory medication;Demonstrates proper use of MDI's             Oxygen Re-Evaluation:   Oxygen Discharge (Final Oxygen Re-Evaluation):   Initial Exercise Prescription:  Initial Exercise Prescription - 03/30/21 1500       Date of Initial Exercise RX and Referring Provider   Date 03/30/21    Referring Provider Gwen Pounds      Oxygen   Oxygen Continuous    Liters 3      Treadmill   MPH 1.3    Grade 0    Minutes 15    METs 2      Recumbant Bike   Level 1    RPM 60    Minutes 15    METs 2      NuStep   Level 1    SPM 80    Minutes 15    METs 2      REL-XR   Level 1    Speed 50    Minutes 15    METs 2      T5 Nustep   Level 1    SPM 50    Minutes 15    METs 2      Prescription Details   Frequency (times per week) 3    Duration Progress to 30 minutes of continuous aerobic without signs/symptoms of physical distress      Intensity   THRR 40-80% of Max Heartrate 113-132    Ratings of Perceived Exertion 11-13    Perceived Dyspnea 0-4      Progression   Progression Continue to progress workloads to maintain intensity without signs/symptoms of physical distress.      Resistance Training   Training Prescription Yes    Weight 3 lb    Reps 10-15             Perform Capillary Blood Glucose checks as needed.  Exercise Prescription Changes:   Exercise Comments:   Exercise Goals  and Review:   Exercise Goals Re-Evaluation :   Discharge Exercise Prescription (Final Exercise Prescription Changes):   Nutrition:  Target Goals: Understanding of nutrition guidelines, daily intake of sodium 1500mg , cholesterol 200mg , calories 30% from fat and  7% or less from saturated fats, daily to have 5 or more servings of fruits and vegetables.  Education: All About Nutrition: -Group instruction provided by verbal, written material, interactive activities, discussions, models, and posters to present general guidelines for heart healthy nutrition including fat, fiber, MyPlate, the role of sodium in heart healthy nutrition, utilization of the nutrition label, and utilization of this knowledge for meal planning. Follow up email sent as well. Written material given at graduation.   Biometrics:    Nutrition Therapy Plan and Nutrition Goals:   Nutrition Assessments:  MEDIFICTS Score Key: ?70 Need to make dietary changes  40-70 Heart Healthy Diet ? 40 Therapeutic Level Cholesterol Diet  Flowsheet Row Pulmonary Rehab [facility] Card[facility] Pulmonary Rehab  Picture Your Plate Total Score on Admission 59      Picture Your Plate Scores: <40<40 Unhealthy dietary pattern with much room for improvement. 41-50 Dietary pattern unlikely to meet recommendations for good health and room for improvement. 51-60 More healthful dietary pattern, with some room for improvement.  >60 Healthy dietary pattern, although there may be some specific behaviors that could be improved.   Nutrition Goals Re-Evaluation:   Nutrition Goals Discharge (Final Nutrition Goals Re-Evaluation):   Psychosocial: Target Goals: Acknowledge presence or absence of significant depression and/or stress, maximize coping skills, provide positive support system. Participant is able to verbalize types and ability to use techniques and skills needed for reducing stress and depression.   Education: Stress,  Anxiety, and Depression - Group verbal and visual presentation to define topics covered.  Reviews how body is impacted by stress, anxiety, and depression.  Also discusses healthy ways to reduce stress and to treat/manage anxiety and depression.  Written material given at graduation.   Education: Sleep Hygiene -Provides group verbal and written instruction about how sleep can affect your health.  Define sleep hygiene, discuss sleep cycles and impact of sleep habits. Review good sleep hygiene tips.    Initial Review & Psychosocial Screening:  Initial Psych Review & Screening - 03/23/21 1346       Initial Review   Current issues with None Identified      Family Dynamics   Good Support System? Yes    Comments Reuel BoomDaniel can look to his daughter that lives nearby and his wife for support. He states that his mental health is great.      Barriers   Psychosocial barriers to participate in program The patient should benefit from training in stress management and relaxation.;There are no identifiable barriers or psychosocial needs.      Screening Interventions   Interventions To provide support and resources with identified psychosocial needs;Provide feedback about the scores to participant;Encouraged to exercise    Expected Outcomes Short Term goal: Utilizing psychosocial counselor, staff and physician to assist with identification of specific Stressors or current issues interfering with healing process. Setting desired goal for each stressor or current issue identified.;Long Term Goal: Stressors or current issues are controlled or eliminated.;Short Term goal: Identification and review with participant of any Quality of Life or Depression concerns found by scoring the questionnaire.;Long Term goal: The participant improves quality of Life and PHQ9 Scores as seen by post scores and/or verbalization of changes             Quality of Life Scores:  Quality of Life - 03/30/21 1612       Quality of  Life   Select Quality of Life      Quality of Life Scores  Health/Function Pre 21.07 %    Socioeconomic Pre 22.71 %    Psych/Spiritual Pre 20.86 %    Family Pre 24.6 %    GLOBAL Pre 21.88 %            Scores of 19 and below usually indicate a poorer quality of life in these areas.  A difference of  2-3 points is a clinically meaningful difference.  A difference of 2-3 points in the total score of the Quality of Life Index has been associated with significant improvement in overall quality of life, self-image, physical symptoms, and general health in studies assessing change in quality of life.  PHQ-9: Recent Review Flowsheet Data     Depression screen [facility] 2/9 03/30/2021   Decreased Interest 0   Down, Depressed, Hopeless 0   PHQ - 2 Score 0   Altered sleeping 2    Tired, decreased energy 3   Change in appetite 1   Feeling bad or failure about yourself  0   Trouble concentrating 2   Moving slowly or fidgety/restless 2   Suicidal thoughts 0   PHQ-9 Score 10   Difficult doing work/chores Somewhat difficult      Interpretation of Total Score  Total Score Depression Severity:  1-4 = Minimal depression, 5-9 = Mild depression, 10-14 = Moderate depression, 15-19 = Moderately severe depression, 20-27 = Severe depression   Psychosocial Evaluation and Intervention:   Psychosocial Re-Evaluation:  Psychosocial Re-Evaluation     Row Name 03/23/21 1347             Psychosocial Re-Evaluation   Current issues with None Identified       Comments Criston can look to his daughter that lives nearby and his wife for support. He states that his mental health is great.       Expected Outcomes Short: Exercise regularly to support mental health and notify staff of any changes. Long: maintain mental health and well being through teaching of rehab or prescribed medications independently.       Interventions Encouraged to attend Pulmonary Rehabilitation for the exercise       Continue  Psychosocial Services  Follow up required by staff                Psychosocial Discharge (Final Psychosocial Re-Evaluation):  Psychosocial Re-Evaluation - 03/23/21 1347       Psychosocial Re-Evaluation   Current issues with None Identified    Comments Wrigley can look to his daughter that lives nearby and his wife for support. He states that his mental health is great.    Expected Outcomes Short: Exercise regularly to support mental health and notify staff of any changes. Long: maintain mental health and well being through teaching of rehab or prescribed medications independently.    Interventions Encouraged to attend Pulmonary Rehabilitation for the exercise    Continue Psychosocial Services  Follow up required by staff             Education: Education Goals: Education classes will be provided on a weekly basis, covering required topics. Participant will state understanding/return demonstration of topics presented.  Learning Barriers/Preferences:  Learning Barriers/Preferences - 03/23/21 1344       Learning Barriers/Preferences   Learning Barriers None    Learning Preferences None             General Pulmonary Education Topics:  Infection Prevention: - Provides verbal and written material to individual with discussion of infection control including proper hand washing and proper  equipment cleaning during exercise session. Flowsheet Row Pulmonary Rehab from 03/30/2021 in [facility] Cardiac and Pulmonary Rehab  Date 03/30/21  Educator AS  Instruction Review Code 1- Verbalizes Understanding       Falls Prevention: - Provides verbal and written material to individual with discussion of falls prevention and safety. Flowsheet Row Pulmonary Rehab from 03/30/2021 in [facility] Cardiac and Pulmonary Rehab  Date 03/30/21  Educator AS  Instruction Review Code 1- Verbalizes Understanding       Chronic Lung Disease Review: - Group verbal instruction with posters, models,  PowerPoint presentations and videos,  to review new updates, new respiratory medications, new advancements in procedures and treatments. Providing information on websites and "800" numbers for continued self-education. Includes information about supplement oxygen, available portable oxygen systems, continuous and intermittent flow rates, oxygen safety, concentrators, and Medicare reimbursement for oxygen. Explanation of Pulmonary Drugs, including class, frequency, complications, importance of spacers, rinsing mouth after steroid MDI's, and proper cleaning methods for nebulizers. Review of basic lung anatomy and physiology related to function, structure, and complications of lung disease. Review of risk factors. Discussion about methods for diagnosing sleep apnea and types of masks and machines for OSA. Includes a review of the use of types of environmental controls: home humidity, furnaces, filters, dust mite/pet prevention, HEPA vacuums. Discussion about weather changes, air quality and the benefits of nasal washing. Instruction on Warning signs, infection symptoms, calling MD promptly, preventive modes, and value of vaccinations. Review of effective airway clearance, coughing and/or vibration techniques. Emphasizing that all should Create an Action Plan. Written material given at graduation.   AED/CPR: - Group verbal and written instruction with the use of models to demonstrate the basic use of the AED with the basic ABC's of resuscitation.    Anatomy and Cardiac Procedures: - Group verbal and visual presentation and models provide information about basic cardiac anatomy and function. Reviews the testing methods done to diagnose heart disease and the outcomes of the test results. Describes the treatment choices: Medical Management, Angioplasty, or Coronary Bypass Surgery for treating various heart conditions including Myocardial Infarction, Angina, Valve Disease, and Cardiac Arrhythmias.  Written material  given at graduation.   Medication Safety: - Group verbal and visual instruction to review commonly prescribed medications for heart and lung disease. Reviews the medication, class of the drug, and side effects. Includes the steps to properly store meds and maintain the prescription regimen.  Written material given at graduation.   Other: -Provides group and verbal instruction on various topics (see comments)   Knowledge Questionnaire Score:  Knowledge Questionnaire Score - 03/30/21 1606       Knowledge Questionnaire Score   Pre Score 21/26 cardiac  14/18 pulmonary              Core Components/Risk Factors/Patient Goals at Admission:  Personal Goals and Risk Factors at Admission - 03/23/21 1344       Core Components/Risk Factors/Patient Goals on Admission    Weight Management Yes;Weight Maintenance;Weight Loss    Intervention Weight Management: Develop a combined nutrition and exercise program designed to reach desired caloric intake, while maintaining appropriate intake of nutrient and fiber, sodium and fats, and appropriate energy expenditure required for the weight goal.;Weight Management: Provide education and appropriate resources to help participant work on and attain dietary goals.;Weight Management/Obesity: Establish reasonable short term and long term weight goals.    Expected Outcomes Short Term: Continue to assess and modify interventions until short term weight is achieved;Long Term: Adherence to nutrition and  physical activity/exercise program aimed toward attainment of established weight goal;Understanding recommendations for meals to include 15-35% energy as protein, 25-35% energy from fat, 35-60% energy from carbohydrates, less than 200mg  of dietary cholesterol, 20-35 gm of total fiber daily;Understanding of distribution of calorie intake throughout the day with the consumption of 4-5 meals/snacks;Weight Loss: Understanding of general recommendations for a balanced  deficit meal plan, which promotes 1-2 lb weight loss per week and includes a negative energy balance of [phone] kcal/d    Improve shortness of breath with ADL's Yes    Intervention Provide education, individualized exercise plan and daily activity instruction to help decrease symptoms of SOB with activities of daily living.    Expected Outcomes Short Term: Improve cardiorespiratory fitness to achieve a reduction of symptoms when performing ADLs;Long Term: Be able to perform more ADLs without symptoms or delay the onset of symptoms    Diabetes Yes    Intervention Provide education about signs/symptoms and action to take for hypo/hyperglycemia.;Provide education about proper nutrition, including hydration, and aerobic/resistive exercise prescription along with prescribed medications to achieve blood glucose in normal ranges: Fasting glucose 65-99 mg/dL    Expected Outcomes Short Term: Participant verbalizes understanding of the signs/symptoms and immediate care of hyper/hypoglycemia, proper foot care and importance of medication, aerobic/resistive exercise and nutrition plan for blood glucose control.;Long Term: Attainment of HbA1C < 7%.    Heart Failure Yes    Intervention Provide a combined exercise and nutrition program that is supplemented with education, support and counseling about heart failure. Directed toward relieving symptoms such as shortness of breath, decreased exercise tolerance, and extremity edema.    Expected Outcomes Improve functional capacity of life;Short term: Attendance in program 2-3 days a week with increased exercise capacity. Reported lower sodium intake. Reported increased fruit and vegetable intake. Reports medication compliance.;Short term: Daily weights obtained and reported for increase. Utilizing diuretic protocols set by physician.;Long term: Adoption of self-care skills and reduction of barriers for early signs and symptoms recognition and intervention leading to self-care  maintenance.    Hypertension Yes    Intervention Provide education on lifestyle modifcations including regular physical activity/exercise, weight management, moderate sodium restriction and increased consumption of fresh fruit, vegetables, and low fat dairy, alcohol moderation, and smoking cessation.;Monitor prescription use compliance.    Expected Outcomes Short Term: Continued assessment and intervention until BP is < 140/18mm HG in hypertensive participants. < 130/48mm HG in hypertensive participants with diabetes, heart failure or chronic kidney disease.;Long Term: Maintenance of blood pressure at goal levels.    Lipids Yes    Intervention Provide education and support for participant on nutrition & aerobic/resistive exercise along with prescribed medications to achieve LDL 70mg , HDL >40mg .    Expected Outcomes Short Term: Participant states understanding of desired cholesterol values and is compliant with medications prescribed. Participant is following exercise prescription and nutrition guidelines.;Long Term: Cholesterol controlled with medications as prescribed, with individualized exercise RX and with personalized nutrition plan. Value goals: LDL < 70mg , HDL > 40 mg.             Education:Diabetes - Individual verbal and written instruction to review signs/symptoms of diabetes, desired ranges of glucose level fasting, after meals and with exercise. Acknowledge that pre and post exercise glucose checks will be done for 3 sessions at entry of program. Flowsheet Row Pulmonary Rehab from 03/23/2021 in [facility] Cardiac and Pulmonary Rehab  Date 03/23/21  Educator [facility]  Instruction Review Code 1- Verbalizes Understanding       Know  Your Numbers and Heart Failure: - Group verbal and visual instruction to discuss disease risk factors for cardiac and pulmonary disease and treatment options.  Reviews associated critical values for Overweight/Obesity, Hypertension, Cholesterol, and Diabetes.   Discusses basics of heart failure: signs/symptoms and treatments.  Introduces Heart Failure Zone chart for action plan for heart failure.  Written material given at graduation.   Core Components/Risk Factors/Patient Goals Review:    Core Components/Risk Factors/Patient Goals at Discharge (Final Review):    ITP Comments:  ITP Comments     Row Name 03/23/21 1348           ITP Comments Virtual Visit completed. Patient informed on EP and RD appointment and 6 Minute walk test. Patient also informed of patient health questionnaires on My Chart. Patient Verbalizes understanding. Visit diagnosis can be found in [facility] 12/02/20.                Comments: initial ITP

## 2021-04-01 ENCOUNTER — Other Ambulatory Visit: Payer: Self-pay

## 2021-04-01 DIAGNOSIS — I5022 Chronic systolic (congestive) heart failure: Secondary | ICD-10-CM

## 2021-04-01 LAB — GLUCOSE, CAPILLARY
Glucose-Capillary: 144 mg/dL — ABNORMAL HIGH (ref 70–99)
Glucose-Capillary: 114 mg/dL — ABNORMAL HIGH (ref 70–99)

## 2021-04-01 NOTE — Progress Notes (Signed)
Daily Session Note  Patient Details  Name: Jose Ray MRN: [id number] Date of Birth: 01/13/1941 Referring Provider:   Flowsheet Row Pulmonary Rehab from 03/30/2021 in [facility] Cardiac and Pulmonary Rehab  Referring Provider [name]       Encounter Date: 04/01/2021  Check In:  Session Check In - 04/01/21 1102       Check-In   Supervising physician immediately available to respond to emergencies See telemetry face sheet for immediately available ER MD    Location [facility]-Cardiac & Pulmonary Rehab    Staff Present Birdie Sons, MPA, Elveria Rising, BA, ACSM CEP, Exercise Physiologist;Joseph Tessie Fass RCP,RRT,BSRT    Virtual Visit No    Medication changes reported     No    Fall or balance concerns reported    No    Warm-up and Cool-down Performed on first and last piece of equipment    Resistance Training Performed Yes    VAD Patient? No    PAD/SET Patient? No      Pain Assessment   Currently in Pain? No/denies                Social History   Tobacco Use  Smoking Status Former   Packs/day: 1.00   Years: 35.00   Pack years: 35.00   Types: Cigarettes   Quit date: 1995   Years since quitting: 27.4  Smokeless Tobacco Never    Goals Met:  Independence with exercise equipment Exercise tolerated well No report of cardiac concerns or symptoms Strength training completed today  Goals Unmet:  Not Applicable  Comments: First full day of exercise!  Patient was oriented to gym and equipment including functions, settings, policies, and procedures.  Patient's individual exercise prescription and treatment plan were reviewed.  All starting workloads were established based on the results of the 6 minute walk test done at initial orientation visit.  The plan for exercise progression was also introduced and progression will be customized based on patient's performance and goals.    Dr. Emily Filbert is Medical Director for [place].  Dr. Ottie Glazier  is Medical Director for [facility] Pulmonary Rehabilitation.

## 2021-04-03 ENCOUNTER — Encounter: Payer: Medicare Other | Admitting: *Deleted

## 2021-04-03 ENCOUNTER — Other Ambulatory Visit: Payer: Self-pay

## 2021-04-03 DIAGNOSIS — I5022 Chronic systolic (congestive) heart failure: Secondary | ICD-10-CM | POA: Diagnosis not present

## 2021-04-03 LAB — GLUCOSE, CAPILLARY
Glucose-Capillary: 207 mg/dL — ABNORMAL HIGH (ref 70–99)
Glucose-Capillary: 157 mg/dL — ABNORMAL HIGH (ref 70–99)

## 2021-04-03 NOTE — Progress Notes (Signed)
Daily Session Note  Patient Details  Name: Jose Ray MRN: [id number] Date of Birth: [birth year] Referring Provider:   Flowsheet Row Pulmonary Rehab from 03/30/2021 in [facility] Cardiac and Pulmonary Rehab  Referring Provider [name]       Encounter Date: 04/03/2021  Check In:  Session Check In - 04/03/21 1131       Check-In   Supervising physician immediately available to respond to emergencies See telemetry face sheet for immediately available ER MD    Location [facility]-Cardiac & Pulmonary Rehab    Staff Present Renita Papa, RN BSN;Joseph [address] [place], [place], [place], CCRP, CCET    Virtual Visit No    Medication changes reported     No    Fall or balance concerns reported    No    Warm-up and Cool-down Performed on first and last piece of equipment    Resistance Training Performed Yes    VAD Patient? No    PAD/SET Patient? No      Pain Assessment   Currently in Pain? No/denies                Social History   Tobacco Use  Smoking Status Former   Packs/day: 1.00   Years: 35.00   Pack years: 35.00   Types: Cigarettes   Quit date: 1995   Years since quitting: 27.4  Smokeless Tobacco Never    Goals Met:  Independence with exercise equipment Exercise tolerated well No report of cardiac concerns or symptoms Strength training completed today  Goals Unmet:  Not Applicable  Comments: Pt able to follow exercise prescription today without complaint.  Will continue to monitor for progression.    Dr. Emily Filbert is Medical Director for [place].  Dr. Ottie Glazier is Medical Director for [facility] Pulmonary Rehabilitation.

## 2021-04-06 ENCOUNTER — Encounter: Payer: Medicare Other | Admitting: *Deleted

## 2021-04-06 ENCOUNTER — Other Ambulatory Visit: Payer: Self-pay

## 2021-04-06 DIAGNOSIS — I5022 Chronic systolic (congestive) heart failure: Secondary | ICD-10-CM

## 2021-04-06 LAB — GLUCOSE, CAPILLARY
Glucose-Capillary: 198 mg/dL — ABNORMAL HIGH (ref 70–99)
Glucose-Capillary: 115 mg/dL — ABNORMAL HIGH (ref 70–99)

## 2021-04-06 NOTE — Progress Notes (Signed)
Daily Session Note  Patient Details  Name: Jose Ray MRN: [id number] Date of Birth: 10-04-1941 Referring Provider:   Flowsheet Row Pulmonary Rehab from 03/30/2021 in [facility] Cardiac and Pulmonary Rehab  Referring Provider Jose Massed       Encounter Date: 04/06/2021  Check In:  Session Check In - 04/06/21 1116       Check-In   Supervising physician immediately available to respond to emergencies See telemetry face sheet for immediately available ER MD    Location [facility]-Cardiac & Pulmonary Rehab    Staff Present Renita Papa, RN BSN;Joseph [address] [place], MPA, RN;Amanda Oletta Darter, [place], ACSM CEP, Exercise Physiologist    Virtual Visit No    Medication changes reported     No    Fall or balance concerns reported    No    Warm-up and Cool-down Performed on first and last piece of equipment    Resistance Training Performed Yes    VAD Patient? No    PAD/SET Patient? No      Pain Assessment   Currently in Pain? No/denies                Social History   Tobacco Use  Smoking Status Former   Packs/day: 1.00   Years: 35.00   Pack years: 35.00   Types: Cigarettes   Quit date: 1995   Years since quitting: 27.5  Smokeless Tobacco Never    Goals Met:  Independence with exercise equipment Exercise tolerated well No report of cardiac concerns or symptoms Strength training completed today  Goals Unmet:  Not Applicable  Comments: Pt able to follow exercise prescription today without complaint.  Will continue to monitor for progression.    Dr. Emily Filbert is Medical Director for [place].  Dr. Ottie Glazier is Medical Director for [facility] Pulmonary Rehabilitation.

## 2021-04-08 ENCOUNTER — Other Ambulatory Visit: Payer: Self-pay

## 2021-04-08 DIAGNOSIS — I5022 Chronic systolic (congestive) heart failure: Secondary | ICD-10-CM | POA: Diagnosis not present

## 2021-04-08 NOTE — Progress Notes (Signed)
Daily Session Note  Patient Details  Name: Jose Ray MRN: [id number] Date of Birth: [birth year] Referring Provider:   Flowsheet Row Pulmonary Rehab from 03/30/2021 in [facility] Cardiac and Pulmonary Rehab  Referring Provider [name]       Encounter Date: 04/08/2021  Check In:  Session Check In - 04/08/21 1137       Check-In   Supervising physician immediately available to respond to emergencies See telemetry face sheet for immediately available ER MD    Location [facility]-Cardiac & Pulmonary Rehab    Staff Present Birdie Sons, MPA, RN;Melissa [place], RDN, LDN;Adelie Croswell, RN,BC,MSN;Joseph [place], [place]    Virtual Visit No    Medication changes reported     No    Fall or balance concerns reported    No    Warm-up and Cool-down Performed on first and last piece of equipment    Resistance Training Performed Yes    VAD Patient? No    PAD/SET Patient? No      Pain Assessment   Currently in Pain? No/denies                Social History   Tobacco Use  Smoking Status Former   Packs/day: 1.00   Years: 35.00   Pack years: 35.00   Types: Cigarettes   Quit date: 1995   Years since quitting: 27.5  Smokeless Tobacco Never    Goals Met:  Independence with exercise equipment Exercise tolerated well No report of cardiac concerns or symptoms Strength training completed today  Goals Unmet:  Not Applicable  Comments: Pt able to follow exercise prescription today without complaint.  Will continue to monitor for progression.    Dr. Emily Filbert is Medical Director for [place].  Dr. Ottie Glazier is Medical Director for [facility] Pulmonary Rehabilitation.

## 2021-04-10 ENCOUNTER — Encounter: Payer: Medicare Other | Attending: Internal Medicine | Admitting: *Deleted

## 2021-04-10 ENCOUNTER — Other Ambulatory Visit: Payer: Self-pay

## 2021-04-10 DIAGNOSIS — I5022 Chronic systolic (congestive) heart failure: Secondary | ICD-10-CM

## 2021-04-10 NOTE — Progress Notes (Signed)
Daily Session Note  Patient Details  Name: Jose Ray MRN: [id number] Date of Birth: 1941-02-21 Referring Provider:   Flowsheet Row Pulmonary Rehab from 03/30/2021 in [facility] Cardiac and Pulmonary Rehab  Referring Provider [name]       Encounter Date: 04/10/2021  Check In:  Session Check In - 04/10/21 1116       Check-In   Supervising physician immediately available to respond to emergencies See telemetry face sheet for immediately available ER MD    Location [facility]-Cardiac & Pulmonary Rehab    Staff Present Renita Papa, RN BSN;Jessica Goodman, MA, RCEP, CCRP, CCET;Joseph [place], [place]    Virtual Visit No    Medication changes reported     No    Fall or balance concerns reported    No    Warm-up and Cool-down Performed on first and last piece of equipment    Resistance Training Performed Yes    VAD Patient? No    PAD/SET Patient? No      Pain Assessment   Currently in Pain? No/denies                Social History   Tobacco Use  Smoking Status Former   Packs/day: 1.00   Years: 35.00   Pack years: 35.00   Types: Cigarettes   Quit date: 1995   Years since quitting: 27.5  Smokeless Tobacco Never    Goals Met:  Independence with exercise equipment Exercise tolerated well No report of cardiac concerns or symptoms Strength training completed today  Goals Unmet:  Not Applicable  Comments: Pt able to follow exercise prescription today without complaint.  Will continue to monitor for progression.    Dr. Emily Filbert is Medical Director for [place].  Dr. Ottie Glazier is Medical Director for [facility] Pulmonary Rehabilitation.

## 2021-04-15 ENCOUNTER — Other Ambulatory Visit: Payer: Self-pay

## 2021-04-15 DIAGNOSIS — I5022 Chronic systolic (congestive) heart failure: Secondary | ICD-10-CM

## 2021-04-15 NOTE — Progress Notes (Signed)
Daily Session Note  Patient Details  Name: Jose Ray MRN: [id number] Date of Birth: 02-Oct-1941 Referring Provider:   Flowsheet Row Pulmonary Rehab from 03/30/2021 in [facility] Cardiac and Pulmonary Rehab  Referring Provider [name]       Encounter Date: 04/15/2021  Check In:  Session Check In - 04/15/21 1036       Check-In   Supervising physician immediately available to respond to emergencies See telemetry face sheet for immediately available ER MD    Location [facility]-Cardiac & Pulmonary Rehab    Staff Present Birdie Sons, MPA, RN;Laureen Owens Shark, BS, RRT, CPFT;Joseph [place], [place]    Virtual Visit No    Medication changes reported     No    Fall or balance concerns reported    No    Warm-up and Cool-down Performed on first and last piece of equipment    Resistance Training Performed Yes    VAD Patient? No    PAD/SET Patient? No      Pain Assessment   Currently in Pain? No/denies                Social History   Tobacco Use  Smoking Status Former   Packs/day: 1.00   Years: 35.00   Pack years: 35.00   Types: Cigarettes   Quit date: 1995   Years since quitting: 27.5  Smokeless Tobacco Never    Goals Met:  Independence with exercise equipment Exercise tolerated well No report of cardiac concerns or symptoms Strength training completed today  Goals Unmet:  Not Applicable  Comments: Pt able to follow exercise prescription today without complaint.  Will continue to monitor for progression.    Dr. Emily Filbert is Medical Director for [place].  Dr. Ottie Glazier is Medical Director for [facility] Pulmonary Rehabilitation.

## 2021-04-17 ENCOUNTER — Other Ambulatory Visit: Payer: Self-pay

## 2021-04-17 ENCOUNTER — Encounter: Payer: Medicare Other | Admitting: *Deleted

## 2021-04-17 DIAGNOSIS — I5022 Chronic systolic (congestive) heart failure: Secondary | ICD-10-CM

## 2021-04-17 NOTE — Progress Notes (Signed)
Daily Session Note  Patient Details  Name: Jose Ray MRN: [id number] Date of Birth: 01/26/1941 Referring Provider:   Flowsheet Row Pulmonary Rehab from 03/30/2021 in [facility] Cardiac and Pulmonary Rehab  Referring Provider [name]       Encounter Date: 04/17/2021  Check In:  Session Check In - 04/17/21 1117       Check-In   Supervising physician immediately available to respond to emergencies See telemetry face sheet for immediately available ER MD    Location [facility]-Cardiac & Pulmonary Rehab    Staff Present Renita Papa, RN BSN;Jessica Luan Pulling, MA, RCEP, CCRP, CCET;Melissa [place], [place], LDN    Virtual Visit No    Medication changes reported     No    Fall or balance concerns reported    No    Warm-up and Cool-down Performed on first and last piece of equipment    Resistance Training Performed Yes    VAD Patient? No    PAD/SET Patient? No      Pain Assessment   Currently in Pain? No/denies                Social History   Tobacco Use  Smoking Status Former   Packs/day: 1.00   Years: 35.00   Pack years: 35.00   Types: Cigarettes   Quit date: 1995   Years since quitting: 27.5  Smokeless Tobacco Never    Goals Met:  Independence with exercise equipment Exercise tolerated well No report of cardiac concerns or symptoms Strength training completed today  Goals Unmet:  Not Applicable  Comments: Pt able to follow exercise prescription today without complaint.  Will continue to monitor for progression.    Dr. Emily Filbert is Medical Director for [place].  Dr. Ottie Glazier is Medical Director for [facility] Pulmonary Rehabilitation.

## 2021-04-20 ENCOUNTER — Encounter: Payer: Medicare Other | Admitting: *Deleted

## 2021-04-20 ENCOUNTER — Other Ambulatory Visit: Payer: Self-pay

## 2021-04-20 DIAGNOSIS — I5022 Chronic systolic (congestive) heart failure: Secondary | ICD-10-CM | POA: Diagnosis not present

## 2021-04-20 NOTE — Progress Notes (Signed)
Daily Session Note  Patient Details  Name: Jose Ray MRN: [id number] Date of Birth: 06/04/1941 Referring Provider:   Flowsheet Row Pulmonary Rehab from 03/30/2021 in [facility] Cardiac and Pulmonary Rehab  Referring Provider [name]       Encounter Date: 04/20/2021  Check In:  Session Check In - 04/20/21 1118       Check-In   Supervising physician immediately available to respond to emergencies See telemetry face sheet for immediately available ER MD    Location [facility]-Cardiac & Pulmonary Rehab    Staff Present Renita Papa, RN BSN;Kristen Coble, RN,BC,MSN;Kelly Amedeo Plenty, BS, ACSM CEP, Exercise Physiologist;Kelly Rosalia Hammers, MPA, RN    Virtual Visit No    Medication changes reported     No    Fall or balance concerns reported    No    Warm-up and Cool-down Performed on first and last piece of equipment    Resistance Training Performed Yes    VAD Patient? No    PAD/SET Patient? No      Pain Assessment   Currently in Pain? No/denies                Social History   Tobacco Use  Smoking Status Former   Packs/day: 1.00   Years: 35.00   Pack years: 35.00   Types: Cigarettes   Quit date: 1995   Years since quitting: 27.5  Smokeless Tobacco Never    Goals Met:  Independence with exercise equipment Exercise tolerated well No report of cardiac concerns or symptoms Strength training completed today  Goals Unmet:  Not Applicable  Comments: Pt able to follow exercise prescription today without complaint.  Will continue to monitor for progression.    Dr. Emily Filbert is Medical Director for [place].  Dr. Ottie Glazier is Medical Director for [facility] Pulmonary Rehabilitation.

## 2021-04-22 ENCOUNTER — Encounter: Payer: Self-pay | Admitting: *Deleted

## 2021-04-22 ENCOUNTER — Other Ambulatory Visit: Payer: Self-pay

## 2021-04-22 ENCOUNTER — Encounter: Payer: Medicare Other | Admitting: *Deleted

## 2021-04-22 DIAGNOSIS — I5022 Chronic systolic (congestive) heart failure: Secondary | ICD-10-CM

## 2021-04-22 NOTE — Progress Notes (Signed)
Pulmonary Individual Treatment Plan  Patient Details  Name: DANDRE SISLER MRN: [id number] Date of Birth: 03/07/1941 Referring Provider:   Flowsheet Row Pulmonary Rehab from 03/30/2021 in [facility] Cardiac and Pulmonary Rehab  Referring Provider [name]       Initial Encounter Date:  Flowsheet Row Pulmonary Rehab from 03/30/2021 in [facility] Cardiac and Pulmonary Rehab  Date 03/30/21       Visit Diagnosis: Heart failure, chronic systolic ([place])  Patient's Home Medications on Admission:  Current Outpatient Medications:    aspirin 81 MG tablet, Take 81 mg by mouth at bedtime., Disp: , Rfl:    atorvastatin (LIPITOR) 80 MG tablet, Take 80 mg by mouth at bedtime. (Patient not taking: Reported on 03/23/2021), Disp: , Rfl:    atorvastatin (LIPITOR) 80 MG tablet, Take 1 tablet by mouth at bedtime., Disp: , Rfl:    famotidine (PEPCID) 10 MG tablet, Take 10 mg by mouth daily., Disp: , Rfl:    fexofenadine (ALLEGRA) 180 MG tablet, Take 180 mg by mouth at bedtime., Disp: , Rfl:    fluticasone (FLONASE) 50 MCG/ACT nasal spray, Place 2 sprays into both nostrils at bedtime., Disp: , Rfl:    fluticasone-salmeterol (ADVAIR) 250-50 MCG/ACT AEPB, Advair Diskus 250 mcg-50 mcg/dose powder for inhalation (Patient not taking: Reported on 03/23/2021), Disp: , Rfl:    Fluticasone-Salmeterol (ADVAIR) 250-50 MCG/DOSE AEPB, Inhale 1 puff into the lungs 2 (two) times daily., Disp: , Rfl:    glipiZIDE (GLUCOTROL) 10 MG tablet, Take 10 mg by mouth daily before breakfast., Disp: , Rfl:    hydrochlorothiazide (HYDRODIURIL) 25 MG tablet, Take 25 mg by mouth daily., Disp: , Rfl:    hydrochlorothiazide (HYDRODIURIL) 25 MG tablet, Take 1 tablet by mouth daily. (Patient not taking: Reported on 03/23/2021), Disp: , Rfl:    icosapent Ethyl (VASCEPA) 1 g capsule, Take 2 g by mouth 2 (two) times daily., Disp: , Rfl:    isosorbide mononitrate (IMDUR) 30 MG 24 hr tablet, Take 30 mg by mouth daily., Disp: , Rfl:    losartan (COZAAR) 50 MG  tablet, Take 100 mg by mouth daily., Disp: , Rfl:    metFORMIN (GLUCOPHAGE) 1000 MG tablet, Take 1,000 mg by mouth 2 (two) times daily., Disp: , Rfl:    metoprolol succinate (TOPROL-XL) 100 MG 24 hr tablet, Take 50 mg by mouth daily. Take with or immediately following a meal. (Patient not taking: Reported on 03/23/2021), Disp: , Rfl:    metoprolol succinate (TOPROL-XL) 25 MG 24 hr tablet, Take 1 tablet by mouth daily., Disp: , Rfl:    Multiple Vitamins-Minerals (PRESERVISION AREDS 2) CAPS, Take 1 capsule by mouth at bedtime. (Patient not taking: Reported on 03/23/2021), Disp: , Rfl:    OXYGEN, Inhale 2.5-3 L into the lungs See admin instructions. Used while sleeping and physical activity, Disp: , Rfl:    Polyethyl Glycol-Propyl Glycol 0.4-0.3 % SOLN, Place 1 drop into both eyes See admin instructions. Instill 1 drop into both eyes at night, may use during the day as needed for dry eyes, Disp: , Rfl:    tiotropium (SPIRIVA) 18 MCG inhalation capsule, Place 18 mcg into inhaler and inhale daily., Disp: , Rfl:   Past Medical History: Past Medical History:  Diagnosis Date   Acute MI ([place])    CHF (congestive heart failure) (HCC)    COPD (chronic obstructive pulmonary disease) (HCC)    Coronary artery disease    Diabetes mellitus without complication (HCC)    GERD (gastroesophageal reflux disease)  History of hiatal hernia    Hypertension     Tobacco Use: Social History   Tobacco Use  Smoking Status Former   Packs/day: 1.00   Years: 35.00   Pack years: 35.00   Types: Cigarettes   Quit date: 1995   Years since quitting: 27.5  Smokeless Tobacco Never    Labs: Recent Review Advice worker     Labs for ITP Cardiac and Pulmonary Rehab Latest Ref Rng & Units 10/25/2016   Hemoglobin A1c 4.8 - 5.6 % 7.0(H)        Pulmonary Assessment Scores:  Pulmonary Assessment Scores     Row Name 03/30/21 1607         ADL UCSD   SOB Score total 60     Rest 1     Walk 3     Stairs 5      Bath 2     Dress 1     Shop 2           CAT Score     CAT Score 22           mMRC Score     mMRC Score 2             UCSD: Self-administered rating of dyspnea associated with activities of daily living (ADLs) 6-point scale (0 = "not at all" to 5 = "maximal or unable to do because of breathlessness")  Scoring Scores range from 0 to 120.  Minimally important difference is 5 units  CAT: CAT can identify the health impairment of COPD patients and is better correlated with disease progression.  CAT has a scoring range of zero to 40. The CAT score is classified into four groups of low (less than 10), medium (10 - 20), high (21-30) and very high (31-40) based on the impact level of disease on health status. A CAT score over 10 suggests significant symptoms.  A worsening CAT score could be explained by an exacerbation, poor medication adherence, poor inhaler technique, or progression of COPD or comorbid conditions.  CAT MCID is 2 points  mMRC: mMRC (Modified Medical Research Council) Dyspnea Scale is used to assess the degree of baseline functional disability in patients of respiratory disease due to dyspnea. No minimal important difference is established. A decrease in score of 1 point or greater is considered a positive change.   Pulmonary Function Assessment:  Pulmonary Function Assessment - 03/23/21 1343       Breath   Shortness of Breath Yes;Limiting activity             Exercise Target Goals: Exercise Program Goal: Individual exercise prescription set using results from initial 6 min walk test and THRR while considering  patient's activity barriers and safety.   Exercise Prescription Goal: Initial exercise prescription builds to 30-45 minutes a day of aerobic activity, 2-3 days per week.  Home exercise guidelines will be given to patient during program as part of exercise prescription that the participant will acknowledge.  Education: Aerobic Exercise: - Group verbal  and visual presentation on the components of exercise prescription. Introduces F.I.T.T principle from ACSM for exercise prescriptions.  Reviews F.I.T.T. principles of aerobic exercise including progression. Written material given at graduation.   Education: Resistance Exercise: - Group verbal and visual presentation on the components of exercise prescription. Introduces F.I.T.T principle from ACSM for exercise prescriptions  Reviews F.I.T.T. principles of resistance exercise including progression. Written material given at graduation. Flowsheet Row Pulmonary Rehab from 04/15/2021 in [facility]  Cardiac and Pulmonary Rehab  Date 04/08/21  Educator [facility]  Instruction Review Code 1- Verbalizes Understanding        Education: Exercise & Equipment Safety: - Individual verbal instruction and demonstration of equipment use and safety with use of the equipment. Flowsheet Row Pulmonary Rehab from 04/15/2021 in [facility] Cardiac and Pulmonary Rehab  Date 03/30/21  Educator [facility]  Instruction Review Code 1- Verbalizes Understanding       Education: Exercise Physiology & General Exercise Guidelines: - Group verbal and written instruction with models to review the exercise physiology of the cardiovascular system and associated critical values. Provides general exercise guidelines with specific guidelines to those with heart or lung disease.    Education: Flexibility, Balance, Mind/Body Relaxation: - Group verbal and visual presentation with interactive activity on the components of exercise prescription. Introduces F.I.T.T principle from ACSM for exercise prescriptions. Reviews F.I.T.T. principles of flexibility and balance exercise training including progression. Also discusses the mind body connection.  Reviews various relaxation techniques to help reduce and manage stress (i.e. Deep breathing, progressive muscle relaxation, and visualization). Balance handout provided to take home. Written material given at  graduation. Flowsheet Row Pulmonary Rehab from 04/15/2021 in [facility] Cardiac and Pulmonary Rehab  Date 04/15/21  Educator [facility]  Instruction Review Code 1- Verbalizes Understanding       Activity Barriers & Risk Stratification:   6 Minute Walk:  6 Minute Walk     Row Name 03/30/21 1554         6 Minute Walk   Phase Initial     Distance 925 feet     Walk Time 6 minutes     # of Rest Breaks 0     MPH 1.75     METS 2     RPE 17     Perceived Dyspnea  3     VO2 Peak 7     Symptoms No     Resting HR 95 bpm     Resting BP 124/54     Resting Oxygen Saturation  88 %     Exercise Oxygen Saturation  during 6 min walk 84 %     Max Ex. HR 108 bpm     Max Ex. BP 160/64     2 Minute Post BP 126/60           Interval HR     1 Minute HR 98     2 Minute HR 97     3 Minute HR 98     4 Minute HR 98     5 Minute HR 105     6 Minute HR 108     2 Minute Post HR 98     Interval Heart Rate? Yes           Interval Oxygen     Interval Oxygen? Yes     Baseline Oxygen Saturation % 88 %     1 Minute Oxygen Saturation % 87 %     1 Minute Liters of Oxygen 3 L     2 Minute Liters of Oxygen 3 L     3 Minute Oxygen Saturation % 88 %     3 Minute Liters of Oxygen 3 L     4 Minute Oxygen Saturation % 85 %     4 Minute Liters of Oxygen 3 L     5 Minute Oxygen Saturation % 85 %     5 Minute Liters of Oxygen 3 L  6 Minute Oxygen Saturation % 84 %     6 Minute Liters of Oxygen 3 L     2 Minute Post Liters of Oxygen 3 L            Oxygen Initial Assessment:  Oxygen Initial Assessment - 03/23/21 1341       Home Oxygen   Home Oxygen Device Home Concentrator;Portable Concentrator;E-Tanks    Sleep Oxygen Prescription Continuous    Liters per minute 3    Home Exercise Oxygen Prescription Continuous    Liters per minute 3    Home Resting Oxygen Prescription None    Liters per minute 0    Compliance with Home Oxygen Use Yes      Initial 6 min Walk   Oxygen Used Continuous    Liters  per minute 3      Program Oxygen Prescription   Program Oxygen Prescription Continuous    Liters per minute 3      Intervention   Short Term Goals To learn and exhibit compliance with exercise, home and travel O2 prescription;To learn and understand importance of monitoring SPO2 with pulse oximeter and demonstrate accurate use of the pulse oximeter.;To learn and understand importance of maintaining oxygen saturations>88%;To learn and demonstrate proper pursed lip breathing techniques or other breathing techniques. ;To learn and demonstrate proper use of respiratory medications    Long  Term Goals Exhibits compliance with exercise, home  and travel O2 prescription;Verbalizes importance of monitoring SPO2 with pulse oximeter and return demonstration;Maintenance of O2 saturations>88%;Exhibits proper breathing techniques, such as pursed lip breathing or other method taught during program session;Compliance with respiratory medication;Demonstrates proper use of MDI's             Oxygen Re-Evaluation:  Oxygen Re-Evaluation     Row Name 04/01/21 1104             Program Oxygen Prescription   Program Oxygen Prescription Continuous       Liters per minute 3               Home Oxygen     Home Oxygen Device Home Concentrator;Portable Concentrator;E-Tanks       Sleep Oxygen Prescription Continuous       Liters per minute 3       Home Exercise Oxygen Prescription Continuous       Liters per minute 3       Home Resting Oxygen Prescription None       Liters per minute 0       Compliance with Home Oxygen Use Yes               Goals/Expected Outcomes     Short Term Goals To learn and exhibit compliance with exercise, home and travel O2 prescription;To learn and understand importance of monitoring SPO2 with pulse oximeter and demonstrate accurate use of the pulse oximeter.;To learn and understand importance of maintaining oxygen saturations>88%;To learn and demonstrate proper pursed lip  breathing techniques or other breathing techniques.        Long  Term Goals Exhibits compliance with exercise, home  and travel O2 prescription;Verbalizes importance of monitoring SPO2 with pulse oximeter and return demonstration;Maintenance of O2 saturations>88%;Exhibits proper breathing techniques, such as pursed lip breathing or other method taught during program session       Comments Reviewed PLB technique with pt.  Talked about how it works and it's importance in maintaining their exercise saturations.       Goals/Expected Outcomes Short: Become  more profiecient at using PLB.   Long: Become independent at using PLB.               Oxygen Discharge (Final Oxygen Re-Evaluation):  Oxygen Re-Evaluation - 04/01/21 1104       Program Oxygen Prescription   Program Oxygen Prescription Continuous    Liters per minute 3      Home Oxygen   Home Oxygen Device Home Concentrator;Portable Concentrator;E-Tanks    Sleep Oxygen Prescription Continuous    Liters per minute 3    Home Exercise Oxygen Prescription Continuous    Liters per minute 3    Home Resting Oxygen Prescription None    Liters per minute 0    Compliance with Home Oxygen Use Yes      Goals/Expected Outcomes   Short Term Goals To learn and exhibit compliance with exercise, home and travel O2 prescription;To learn and understand importance of monitoring SPO2 with pulse oximeter and demonstrate accurate use of the pulse oximeter.;To learn and understand importance of maintaining oxygen saturations>88%;To learn and demonstrate proper pursed lip breathing techniques or other breathing techniques.     Long  Term Goals Exhibits compliance with exercise, home  and travel O2 prescription;Verbalizes importance of monitoring SPO2 with pulse oximeter and return demonstration;Maintenance of O2 saturations>88%;Exhibits proper breathing techniques, such as pursed lip breathing or other method taught during program session    Comments Reviewed PLB  technique with pt.  Talked about how it works and it's importance in maintaining their exercise saturations.    Goals/Expected Outcomes Short: Become more profiecient at using PLB.   Long: Become independent at using PLB.             Initial Exercise Prescription:  Initial Exercise Prescription - 03/30/21 1500       Date of Initial Exercise RX and Referring Provider   Date 03/30/21    Referring Provider Gwen Pounds      Oxygen   Oxygen Continuous    Liters 3      Treadmill   MPH 1.3    Grade 0    Minutes 15    METs 2      Recumbant Bike   Level 1    RPM 60    Minutes 15    METs 2      NuStep   Level 1    SPM 80    Minutes 15    METs 2      REL-XR   Level 1    Speed 50    Minutes 15    METs 2      T5 Nustep   Level 1    SPM 50    Minutes 15    METs 2      Prescription Details   Frequency (times per week) 3    Duration Progress to 30 minutes of continuous aerobic without signs/symptoms of physical distress      Intensity   THRR 40-80% of Max Heartrate 113-132    Ratings of Perceived Exertion 11-13    Perceived Dyspnea 0-4      Progression   Progression Continue to progress workloads to maintain intensity without signs/symptoms of physical distress.      Resistance Training   Training Prescription Yes    Weight 3 lb    Reps 10-15             Perform Capillary Blood Glucose checks as needed.  Exercise Prescription Changes:   Exercise Prescription Changes  Row Name 04/16/21 0900             Response to Exercise   Blood Pressure (Admit) 132/68       Blood Pressure (Exercise) 128/64       Blood Pressure (Exit) 102/60       Heart Rate (Admit) 75 bpm       Heart Rate (Exercise) 98 bpm       Heart Rate (Exit) 93 bpm       Oxygen Saturation (Admit) 92 %       Oxygen Saturation (Exercise) 90 %       Oxygen Saturation (Exit) 93 %       Rating of Perceived Exertion (Exercise) 13       Perceived Dyspnea (Exercise) 1       Symptoms  none       Comments first full week of exercise       Duration Progress to 30 minutes of  aerobic without signs/symptoms of physical distress       Intensity THRR unchanged               Progression     Progression Continue to progress workloads to maintain intensity without signs/symptoms of physical distress.       Average METs 2.06               Resistance Training     Training Prescription Yes       Weight 5 lb       Reps 10-15               Oxygen     Oxygen Continuous       Liters 3               Treadmill     MPH 1.3       Grade 0       Minutes 15       METs 2               Recumbant Bike     Level 1       Minutes 18       METs 2.82               T5 Nustep     Level 3       Minutes 15       METs 1.9               Exercise Comments:   Exercise Comments     Row Name 04/01/21 1103           Exercise Comments First full day of exercise!  Patient was oriented to gym and equipment including functions, settings, policies, and procedures.  Patient's individual exercise prescription and treatment plan were reviewed.  All starting workloads were established based on the results of the 6 minute walk test done at initial orientation visit.  The plan for exercise progression was also introduced and progression will be customized based on patient's performance and goals.                Exercise Goals and Review:   Exercise Goals     Row Name 03/30/21 1617             Exercise Goals   Increase Physical Activity Yes       Intervention Provide advice, education, support and counseling about physical activity/exercise needs.;Develop an individualized exercise prescription for aerobic and resistive training  based on initial evaluation findings, risk stratification, comorbidities and participant's personal goals.       Expected Outcomes Short Term: Attend rehab on a regular basis to increase amount of physical activity.;Long Term: Add in home exercise to  make exercise part of routine and to increase amount of physical activity.;Long Term: Exercising regularly at least 3-5 days a week.       Increase Strength and Stamina Yes       Intervention Provide advice, education, support and counseling about physical activity/exercise needs.;Develop an individualized exercise prescription for aerobic and resistive training based on initial evaluation findings, risk stratification, comorbidities and participant's personal goals.       Expected Outcomes Short Term: Increase workloads from initial exercise prescription for resistance, speed, and METs.;Short Term: Perform resistance training exercises routinely during rehab and add in resistance training at home;Long Term: Improve cardiorespiratory fitness, muscular endurance and strength as measured by increased METs and functional capacity ( )       Able to understand and use rate of perceived exertion (RPE) scale Yes       Intervention Provide education and explanation on how to use RPE scale       Expected Outcomes Short Term: Able to use RPE daily in rehab to express subjective intensity level;Long Term:  Able to use RPE to guide intensity level when exercising independently       Able to understand and use Dyspnea scale Yes       Intervention Provide education and explanation on how to use Dyspnea scale       Expected Outcomes Short Term: Able to use Dyspnea scale daily in rehab to express subjective sense of shortness of breath during exertion;Long Term: Able to use Dyspnea scale to guide intensity level when exercising independently       Knowledge and understanding of Target Heart Rate Range (THRR) Yes       Intervention Provide education and explanation of THRR including how the numbers were predicted and where they are located for reference       Expected Outcomes Short Term: Able to state/look up THRR;Short Term: Able to use daily as guideline for intensity in rehab;Long Term: Able to use THRR to govern  intensity when exercising independently       Able to check pulse independently Yes       Intervention Provide education and demonstration on how to check pulse in carotid and radial arteries.;Review the importance of being able to check your own pulse for safety during independent exercise       Expected Outcomes Short Term: Able to explain why pulse checking is important during independent exercise;Long Term: Able to check pulse independently and accurately       Understanding of Exercise Prescription Yes       Intervention Provide education, explanation, and written materials on patient's individual exercise prescription       Expected Outcomes Short Term: Able to explain program exercise prescription;Long Term: Able to explain home exercise prescription to exercise independently                Exercise Goals Re-Evaluation :  Exercise Goals Re-Evaluation     Row Name 04/01/21 1104 04/16/21 0919           Exercise Goal Re-Evaluation   Exercise Goals Review Increase Physical Activity;Able to understand and use rate of perceived exertion (RPE) scale;Knowledge and understanding of Target Heart Rate Range (THRR);Understanding of Exercise Prescription;Increase Strength and Stamina;Able to understand and use  Dyspnea scale;Able to check pulse independently Increase Physical Activity;Increase Strength and Stamina;Understanding of Exercise Prescription      Comments Reviewed RPE and dyspnea scales, THR and program prescription with pt today.  Pt voiced understanding and was given a copy of goals to take home. Ferdinando is off to a good start as last week was his first full week of exercise. He has already increased to level 3 on the T5 Nustep and 5 lbs for hand weights. Will continue to monitor progress.      Expected Outcomes Short: Use RPE daily to regulate intensity. Long: Follow program prescription in THR. Short: Continue to build up speed on TM Long: Continue to increase overall strength and  stamina               Discharge Exercise Prescription (Final Exercise Prescription Changes):  Exercise Prescription Changes - 04/16/21 0900       Response to Exercise   Blood Pressure (Admit) 132/68    Blood Pressure (Exercise) 128/64    Blood Pressure (Exit) 102/60    Heart Rate (Admit) 75 bpm    Heart Rate (Exercise) 98 bpm    Heart Rate (Exit) 93 bpm    Oxygen Saturation (Admit) 92 %    Oxygen Saturation (Exercise) 90 %    Oxygen Saturation (Exit) 93 %    Rating of Perceived Exertion (Exercise) 13    Perceived Dyspnea (Exercise) 1    Symptoms none    Comments first full week of exercise    Duration Progress to 30 minutes of  aerobic without signs/symptoms of physical distress    Intensity THRR unchanged      Progression   Progression Continue to progress workloads to maintain intensity without signs/symptoms of physical distress.    Average METs 2.06      Resistance Training   Training Prescription Yes    Weight 5 lb    Reps 10-15      Oxygen   Oxygen Continuous    Liters 3      Treadmill   MPH 1.3    Grade 0    Minutes 15    METs 2      Recumbant Bike   Level 1    Minutes 18    METs 2.82      T5 Nustep   Level 3    Minutes 15    METs 1.9             Nutrition:  Target Goals: Understanding of nutrition guidelines, daily intake of sodium 1500mg , cholesterol 200mg , calories 30% from fat and 7% or less from saturated fats, daily to have 5 or more servings of fruits and vegetables.  Education: All About Nutrition: -Group instruction provided by verbal, written material, interactive activities, discussions, models, and posters to present general guidelines for heart healthy nutrition including fat, fiber, MyPlate, the role of sodium in heart healthy nutrition, utilization of the nutrition label, and utilization of this knowledge for meal planning. Follow up email sent as well. Written material given at graduation.   Biometrics:  Pre Biometrics -  03/31/21 1009       Pre Biometrics   Height 5' 11.5" (1.816 m)    Weight 213 lb 8 oz (96.8 kg)    BMI (Calculated) 29.37    Single Leg Stand 9.8 seconds              Nutrition Therapy Plan and Nutrition Goals:  Nutrition Therapy & Goals - 04/08/21 1248  Nutrition Therapy   Diet Heart healthy, low Na, diabetes friendly    Drug/Food Interactions Statins/Certain Fruits    Protein (specify units) 110-115g    Fiber 30 grams    Whole Grain Foods 3 servings    Saturated Fats 12 max. grams    Fruits and Vegetables 8 servings/day    Sodium 1.5 grams      Personal Nutrition Goals   Nutrition Goal ST: add snacks in between breakfast and dinner with protein and fat like fruit with nuts or bean salad LT: meet protein needs, eat consistent carbohydrates    Comments B: Toast (whole wheat) (2 slices with low fat cream cheese) S: round of cheese D: varies: vegetables and meat S: ice cream bar or sherbert. They just got a new monitor to see if it will work. A1C: a bit over 7 the last time he was here. They almost never eat fried food, they go out to eat maybe once per week. He will coook and he will does not typically use oil, but will put hamburger in baking dish and [place] dressing. He will also use pam to grease pan. He also does not cook with any salt. He will use truvia and less sugar when recipe calls for sugar. Discussed heart healthy eating and diabetes friendly eating.      Intervention Plan   Intervention Prescribe, educate and counsel regarding individualized specific dietary modifications aiming towards targeted core components such as weight, hypertension, lipid management, diabetes, heart failure and other comorbidities.;Nutrition handout(s) given to patient.    Expected Outcomes Short Term Goal: Understand basic principles of dietary content, such as calories, fat, sodium, cholesterol and nutrients.;Short Term Goal: A plan has been developed with personal nutrition goals set  during dietitian appointment.;Long Term Goal: Adherence to prescribed nutrition plan.             Nutrition Assessments:  MEDIFICTS Score Key: ?70 Need to make dietary changes  40-70 Heart Healthy Diet ? 40 Therapeutic Level Cholesterol Diet  Flowsheet Row Pulmonary Rehab from 03/30/2021 in [facility] Cardiac and Pulmonary Rehab  Picture Your Plate Total Score on Admission 59      Picture Your Plate Scores: <40 Unhealthy dietary pattern with much room for improvement. 41-50 Dietary pattern unlikely to meet recommendations for good health and room for improvement. 51-60 More healthful dietary pattern, with some room for improvement.  >60 Healthy dietary pattern, although there may be some specific behaviors that could be improved.   Nutrition Goals Re-Evaluation:   Nutrition Goals Discharge (Final Nutrition Goals Re-Evaluation):   Psychosocial: Target Goals: Acknowledge presence or absence of significant depression and/or stress, maximize coping skills, provide positive support system. Participant is able to verbalize types and ability to use techniques and skills needed for reducing stress and depression.   Education: Stress, Anxiety, and Depression - Group verbal and visual presentation to define topics covered.  Reviews how body is impacted by stress, anxiety, and depression.  Also discusses healthy ways to reduce stress and to treat/manage anxiety and depression.  Written material given at graduation.   Education: Sleep Hygiene -Provides group verbal and written instruction about how sleep can affect your health.  Define sleep hygiene, discuss sleep cycles and impact of sleep habits. Review good sleep hygiene tips.    Initial Review & Psychosocial Screening:  Initial Psych Review & Screening - 03/23/21 1346       Initial Review   Current issues with None Identified      Family Dynamics  Good Support System? Yes    Comments Jawanza can look to his daughter that lives  nearby and his wife for support. He states that his mental health is great.      Barriers   Psychosocial barriers to participate in program The patient should benefit from training in stress management and relaxation.;There are no identifiable barriers or psychosocial needs.      Screening Interventions   Interventions To provide support and resources with identified psychosocial needs;Provide feedback about the scores to participant;Encouraged to exercise    Expected Outcomes Short Term goal: Utilizing psychosocial counselor, staff and physician to assist with identification of specific Stressors or current issues interfering with healing process. Setting desired goal for each stressor or current issue identified.;Long Term Goal: Stressors or current issues are controlled or eliminated.;Short Term goal: Identification and review with participant of any Quality of Life or Depression concerns found by scoring the questionnaire.;Long Term goal: The participant improves quality of Life and PHQ9 Scores as seen by post scores and/or verbalization of changes             Quality of Life Scores:  Quality of Life - 03/30/21 1612       Quality of Life   Select Quality of Life      Quality of Life Scores   Health/Function Pre 21.07 %    Socioeconomic Pre 22.71 %    Psych/Spiritual Pre 20.86 %    Family Pre 24.6 %    GLOBAL Pre 21.88 %            Scores of 19 and below usually indicate a poorer quality of life in these areas.  A difference of  2-3 points is a clinically meaningful difference.  A difference of 2-3 points in the total score of the Quality of Life Index has been associated with significant improvement in overall quality of life, self-image, physical symptoms, and general health in studies assessing change in quality of life.  PHQ-9: Recent Review Flowsheet Data     Depression screen [facility] 2/9 03/30/2021   Decreased Interest 0   Down, Depressed, Hopeless 0   PHQ - 2 Score 0    Altered sleeping 2    Tired, decreased energy 3   Change in appetite 1   Feeling bad or failure about yourself  0   Trouble concentrating 2   Moving slowly or fidgety/restless 2   Suicidal thoughts 0   PHQ-9 Score 10   Difficult doing work/chores Somewhat difficult      Interpretation of Total Score  Total Score Depression Severity:  1-4 = Minimal depression, 5-9 = Mild depression, 10-14 = Moderate depression, 15-19 = Moderately severe depression, 20-27 = Severe depression   Psychosocial Evaluation and Intervention:   Psychosocial Re-Evaluation:  Psychosocial Re-Evaluation     Row Name 03/23/21 1347             Psychosocial Re-Evaluation   Current issues with None Identified       Comments Demone can look to his daughter that lives nearby and his wife for support. He states that his mental health is great.       Expected Outcomes Short: Exercise regularly to support mental health and notify staff of any changes. Long: maintain mental health and well being through teaching of rehab or prescribed medications independently.       Interventions Encouraged to attend Pulmonary Rehabilitation for the exercise       Continue Psychosocial Services  Follow up required  by staff                Psychosocial Discharge (Final Psychosocial Re-Evaluation):  Psychosocial Re-Evaluation - 03/23/21 1347       Psychosocial Re-Evaluation   Current issues with None Identified    Comments [place] can look to his daughter that lives nearby and his wife for support. He states that his mental health is great.    Expected Outcomes Short: Exercise regularly to support mental health and notify staff of any changes. Long: maintain mental health and well being through teaching of rehab or prescribed medications independently.    Interventions Encouraged to attend Pulmonary Rehabilitation for the exercise    Continue Psychosocial Services  Follow up required by staff              Education: Education Goals: Education classes will be provided on a weekly basis, covering required topics. Participant will state understanding/return demonstration of topics presented.  Learning Barriers/Preferences:  Learning Barriers/Preferences - 03/23/21 1344       Learning Barriers/Preferences   Learning Barriers None    Learning Preferences None             General Pulmonary Education Topics:  Infection Prevention: - Provides verbal and written material to individual with discussion of infection control including proper hand washing and proper equipment cleaning during exercise session. Flowsheet Row Pulmonary Rehab from 04/15/2021 in [facility] Cardiac and Pulmonary Rehab  Date 03/30/21  Educator AS  Instruction Review Code 1- Verbalizes Understanding       Falls Prevention: - Provides verbal and written material to individual with discussion of falls prevention and safety. Flowsheet Row Pulmonary Rehab from 04/15/2021 in [facility] Cardiac and Pulmonary Rehab  Date 03/30/21  Educator AS  Instruction Review Code 1- Verbalizes Understanding       Chronic Lung Disease Review: - Group verbal instruction with posters, models, PowerPoint presentations and videos,  to review new updates, new respiratory medications, new advancements in procedures and treatments. Providing information on websites and "800" numbers for continued self-education. Includes information about supplement oxygen, available portable oxygen systems, continuous and intermittent flow rates, oxygen safety, concentrators, and Medicare reimbursement for oxygen. Explanation of Pulmonary Drugs, including class, frequency, complications, importance of spacers, rinsing mouth after steroid MDI's, and proper cleaning methods for nebulizers. Review of basic lung anatomy and physiology related to function, structure, and complications of lung disease. Review of risk factors. Discussion about methods for diagnosing sleep  apnea and types of masks and machines for OSA. Includes a review of the use of types of environmental controls: home humidity, furnaces, filters, dust mite/pet prevention, HEPA vacuums. Discussion about weather changes, air quality and the benefits of nasal washing. Instruction on Warning signs, infection symptoms, calling MD promptly, preventive modes, and value of vaccinations. Review of effective airway clearance, coughing and/or vibration techniques. Emphasizing that all should Create an Action Plan. Written material given at graduation.   AED/CPR: - Group verbal and written instruction with the use of models to demonstrate the basic use of the AED with the basic ABC's of resuscitation.    Anatomy and Cardiac Procedures: - Group verbal and visual presentation and models provide information about basic cardiac anatomy and function. Reviews the testing methods done to diagnose heart disease and the outcomes of the test results. Describes the treatment choices: Medical Management, Angioplasty, or Coronary Bypass Surgery for treating various heart conditions including Myocardial Infarction, Angina, Valve Disease, and Cardiac Arrhythmias.  Written material given at graduation. Flowsheet Row  Pulmonary Rehab from 04/15/2021 in [facility] Cardiac and Pulmonary Rehab  Date 04/08/21  Educator SB  Instruction Review Code 1- Verbalizes Understanding       Medication Safety: - Group verbal and visual instruction to review commonly prescribed medications for heart and lung disease. Reviews the medication, class of the drug, and side effects. Includes the steps to properly store meds and maintain the prescription regimen.  Written material given at graduation.   Other: -Provides group and verbal instruction on various topics (see comments)   Knowledge Questionnaire Score:  Knowledge Questionnaire Score - 03/30/21 1606       Knowledge Questionnaire Score   Pre Score 21/26 cardiac  14/18 pulmonary               Core Components/Risk Factors/Patient Goals at Admission:  Personal Goals and Risk Factors at Admission - 03/30/21 1614       Core Components/Risk Factors/Patient Goals on Admission    Weight Management Yes;Weight Maintenance;Weight Loss    Intervention Weight Management: Develop a combined nutrition and exercise program designed to reach desired caloric intake, while maintaining appropriate intake of nutrient and fiber, sodium and fats, and appropriate energy expenditure required for the weight goal.;Weight Management: Provide education and appropriate resources to help participant work on and attain dietary goals.;Weight Management/Obesity: Establish reasonable short term and long term weight goals.    Expected Outcomes Short Term: Continue to assess and modify interventions until short term weight is achieved;Long Term: Adherence to nutrition and physical activity/exercise program aimed toward attainment of established weight goal;Understanding recommendations for meals to include 15-35% energy as protein, 25-35% energy from fat, 35-60% energy from carbohydrates, less than  of dietary cholesterol, 20-35 gm of total fiber daily;Understanding of distribution of calorie intake throughout the day with the consumption of 4-5 meals/snacks;Weight Loss: Understanding of general recommendations for a balanced deficit meal plan, which promotes 1-2 lb weight loss per week and includes a negative energy balance of [phone] kcal/d    Improve shortness of breath with ADL's Yes    Intervention Provide education, individualized exercise plan and daily activity instruction to help decrease symptoms of SOB with activities of daily living.    Expected Outcomes Short Term: Improve cardiorespiratory fitness to achieve a reduction of symptoms when performing ADLs;Long Term: Be able to perform more ADLs without symptoms or delay the onset of symptoms    Diabetes Yes    Intervention Provide education about  signs/symptoms and action to take for hypo/hyperglycemia.;Provide education about proper nutrition, including hydration, and aerobic/resistive exercise prescription along with prescribed medications to achieve blood glucose in normal ranges: Fasting glucose 65-99 mg/dL    Expected Outcomes Short Term: Participant verbalizes understanding of the signs/symptoms and immediate care of hyper/hypoglycemia, proper foot care and importance of medication, aerobic/resistive exercise and nutrition plan for blood glucose control.;Long Term: Attainment of HbA1C < 7%.    Heart Failure Yes    Intervention Provide a combined exercise and nutrition program that is supplemented with education, support and counseling about heart failure. Directed toward relieving symptoms such as shortness of breath, decreased exercise tolerance, and extremity edema.    Expected Outcomes Improve functional capacity of life;Short term: Attendance in program 2-3 days a week with increased exercise capacity. Reported lower sodium intake. Reported increased fruit and vegetable intake. Reports medication compliance.;Short term: Daily weights obtained and reported for increase. Utilizing diuretic protocols set by physician.;Long term: Adoption of self-care skills and reduction of barriers for early signs and symptoms recognition and intervention  leading to self-care maintenance.    Hypertension Yes    Intervention Provide education on lifestyle modifcations including regular physical activity/exercise, weight management, moderate sodium restriction and increased consumption of fresh fruit, vegetables, and low fat dairy, alcohol moderation, and smoking cessation.;Monitor prescription use compliance.    Expected Outcomes Short Term: Continued assessment and intervention until BP is < 140/1790mm HG in hypertensive participants. < 130/3280mm HG in hypertensive participants with diabetes, heart failure or chronic kidney disease.;Long Term: Maintenance of  blood pressure at goal levels.    Lipids Yes    Intervention Provide education and support for participant on nutrition & aerobic/resistive exercise along with prescribed medications to achieve LDL 70mg , HDL >40mg .    Expected Outcomes Short Term: Participant states understanding of desired cholesterol values and is compliant with medications prescribed. Participant is following exercise prescription and nutrition guidelines.;Long Term: Cholesterol controlled with medications as prescribed, with individualized exercise RX and with personalized nutrition plan. Value goals: LDL < 70mg , HDL > 40 mg.             Education:Diabetes - Individual verbal and written instruction to review signs/symptoms of diabetes, desired ranges of glucose level fasting, after meals and with exercise. Acknowledge that pre and post exercise glucose checks will be done for 3 sessions at entry of program. Flowsheet Row Pulmonary Rehab [facility] Card[facility] Pulmonary Re[facility]  Date 03/23/21  [facility]  Instruction Review Code 1- Verbalizes Understanding       Know Your Numbers and Heart Failure: - Group verbal and visual instruction to discuss disease risk factors for cardiac and pulmonary disease and treatment options.  Reviews associated critical values for Overweight/Obesity, Hypertension, Cholesterol, and Diabetes.  Discusses basics of heart failure: signs/symptoms and treatments.  Introduces Heart Failure Zone chart for action plan for heart failure.  Written material given at graduation.   Core Components/Risk Factors/Patient Goals Review:    Core Components/Risk Factors/Patient Goals at Discharge (Final Review):    ITP Comments:  ITP Comments     Row Name 03/23/21 1348 03/30/21 1624 04/01/21 1103 04/22/21 0908     ITP Comments Virtual Visit completed. Patient informed on EP and RD appointment and 6 Minute walk test. Patient also informed of patient health questionnaires on My Chart.  Patient Verbalizes understanding. [facility]. Completed 6MWT and gym orientation. Initial ITP created and sent for review to Dr Vida RiggerFuad Aleskerov Medical Director. First full day of exercise!  Patient was oriented to gym and equipment including functions, settings, policies, and procedures.  Patient's individual exercise prescription and treatment plan were reviewed.  All starting workloads were established based on the results of the 6 minute walk test done at initial orientation visit.  The plan for exercise progression was also introduced and progression will be customized based on patient's performance and goals. 30 Day review completed. Medical Director ITP review done, changes made as directed, and signed approval by Medical Director.             Comments:

## 2021-04-22 NOTE — Progress Notes (Signed)
Daily Session Note  Patient Details  Name: Jose Ray MRN: [id number] Date of Birth: [birth year] Referring Provider:   Flowsheet Row Pulmonary Rehab from 03/30/2021 in [facility] Cardiac and Pulmonary Rehab  Referring Provider [name]       Encounter Date: 04/22/2021  Check In:  Session Check In - 04/22/21 1126       Check-In   Supervising physician immediately available to respond to emergencies See telemetry face sheet for immediately available ER MD    Location [facility]-Cardiac & Pulmonary Rehab    Staff Present Renita Papa, RN Sherryl Barters, MPA, Nino Glow, MS, ASCM CEP, Exercise Physiologist    Virtual Visit No    Medication changes reported     No    Fall or balance concerns reported    No    Warm-up and Cool-down Performed on first and last piece of equipment    Resistance Training Performed Yes    VAD Patient? No    PAD/SET Patient? No      Pain Assessment   Currently in Pain? No/denies                Social History   Tobacco Use  Smoking Status Former   Packs/day: 1.00   Years: 35.00   Pack years: 35.00   Types: Cigarettes   Quit date: 1995   Years since quitting: 27.5  Smokeless Tobacco Never    Goals Met:  Independence with exercise equipment Exercise tolerated well No report of cardiac concerns or symptoms Strength training completed today  Goals Unmet:  Not Applicable  Comments: Pt able to follow exercise prescription today without complaint.  Will continue to monitor for progression.    Dr. Emily Filbert is Medical Director for [facility].  Dr. Ottie Glazier is Medical Director for [facility] Pulmonary Rehabilitation.

## 2021-04-24 ENCOUNTER — Encounter: Payer: Medicare Other | Admitting: *Deleted

## 2021-04-24 ENCOUNTER — Other Ambulatory Visit: Payer: Self-pay

## 2021-04-24 DIAGNOSIS — I5022 Chronic systolic (congestive) heart failure: Secondary | ICD-10-CM

## 2021-04-24 NOTE — Progress Notes (Signed)
Daily Session Note  Patient Details  Name: Jose Ray MRN: [id number] Date of Birth: 1940/11/26 Referring Provider:   Flowsheet Row Pulmonary Rehab from 03/30/2021 in [facility] Cardiac and Pulmonary Rehab  Referring Provider [name]       Encounter Date: 04/24/2021  Check In:  Session Check In - 04/24/21 1130       Check-In   Supervising physician immediately available to respond to emergencies See telemetry face sheet for immediately available ER MD    Location [facility]-Cardiac & Pulmonary Rehab    Staff Present Renita Papa, RN BSN;Joseph Tessie Fass, RCP,RRT,BSRT;Melissa [place], [place], LDN    Virtual Visit No    Medication changes reported     No    Fall or balance concerns reported    No    Warm-up and Cool-down Performed on first and last piece of equipment    Resistance Training Performed Yes    VAD Patient? No    PAD/SET Patient? No      Pain Assessment   Currently in Pain? No/denies                Social History   Tobacco Use  Smoking Status Former   Packs/day: 1.00   Years: 35.00   Pack years: 35.00   Types: Cigarettes   Quit date: 1995   Years since quitting: 27.5  Smokeless Tobacco Never    Goals Met:  Independence with exercise equipment Exercise tolerated well No report of cardiac concerns or symptoms Strength training completed today  Goals Unmet:  Not Applicable  Comments: Pt able to follow exercise prescription today without complaint.  Will continue to monitor for progression.    Dr. Emily Filbert is Medical Director for [name].  Dr. Ottie Glazier is Medical Director for [facility] Pulmonary Rehabilitation.

## 2021-04-27 ENCOUNTER — Other Ambulatory Visit: Payer: Self-pay

## 2021-04-27 ENCOUNTER — Encounter: Payer: Medicare Other | Admitting: *Deleted

## 2021-04-27 DIAGNOSIS — I5022 Chronic systolic (congestive) heart failure: Secondary | ICD-10-CM | POA: Diagnosis not present

## 2021-04-27 NOTE — Progress Notes (Signed)
Daily Session Note  Patient Details  Name: Jose Ray MRN: [id number] Date of Birth: Dec 11, 1940 Referring Provider:   Flowsheet Row Pulmonary Rehab from 03/30/2021 in [facility] Cardiac and Pulmonary Rehab  Referring Provider [name]       Encounter Date: 04/27/2021  Check In:  Session Check In - 04/27/21 1143       Check-In   Supervising physician immediately available to respond to emergencies See telemetry face sheet for immediately available ER MD    Location [facility]-Cardiac & Pulmonary Rehab    Staff Present Heath Lark, RN, BSN, CCRP;[place], MPA, [place], BS, ACSM CEP, Exercise Physiologist;Joseph [place], [place]    Virtual Visit No    Medication changes reported     No    Fall or balance concerns reported    No    Warm-up and Cool-down Performed on first and last piece of equipment    Resistance Training Performed Yes    VAD Patient? No    PAD/SET Patient? No      Pain Assessment   Currently in Pain? No/denies                Social History   Tobacco Use  Smoking Status Former   Packs/day: 1.00   Years: 35.00   Pack years: 35.00   Types: Cigarettes   Quit date: 1995   Years since quitting: 27.5  Smokeless Tobacco Never    Goals Met:  Proper associated with RPD/PD & O2 Sat Independence with exercise equipment Exercise tolerated well No report of cardiac concerns or symptoms  Goals Unmet:  Not Applicable  Comments: Pt able to follow exercise prescription today without complaint.  Will continue to monitor for progression.    Dr. Emily Filbert is Medical Director for [place].  Dr. Ottie Glazier is Medical Director for [facility] Pulmonary Rehabilitation.

## 2021-04-29 ENCOUNTER — Other Ambulatory Visit: Payer: Self-pay

## 2021-04-29 DIAGNOSIS — I5022 Chronic systolic (congestive) heart failure: Secondary | ICD-10-CM

## 2021-04-29 NOTE — Progress Notes (Signed)
Daily Session Note  Patient Details  Name: Jose Ray MRN: [id number] Date of Birth: 26-May-1941 Referring Provider:   Flowsheet Row Pulmonary Rehab from 03/30/2021 in [facility] Cardiac and Pulmonary Rehab  Referring Provider [name]       Encounter Date: 04/29/2021  Check In:  Session Check In - 04/29/21 1027       Check-In   Supervising physician immediately available to respond to emergencies See telemetry face sheet for immediately available ER MD    Location [facility]-Cardiac & Pulmonary Rehab    Staff Present Birdie Sons, MPA, Elveria Rising, BA, ACSM CEP, Exercise Physiologist;Joseph A[place], RCP,RRT,BSRT;Melissa [place], [place], LDN    Virtual Visit No    Medication changes reported     No    Fall or balance concerns reported    No    Warm-up and Cool-down Performed on first and last piece of equipment    Resistance Training Performed Yes    VAD Patient? No    PAD/SET Patient? No      Pain Assessment   Currently in Pain? No/denies                Social History   Tobacco Use  Smoking Status Former   Packs/day: 1.00   Years: 35.00   Pack years: 35.00   Types: Cigarettes   Quit date: 1995   Years since quitting: 27.5  Smokeless Tobacco Never    Goals Met:  Independence with exercise equipment Exercise tolerated well No report of cardiac concerns or symptoms Strength training completed today  Goals Unmet:  Not Applicable  Comments: Pt able to follow exercise prescription today without complaint.  Will continue to monitor for progression.    Dr. Emily Filbert is Medical Director for [place].  Dr. Ottie Glazier is Medical Director for [facility] Pulmonary Rehabilitation.

## 2021-05-01 ENCOUNTER — Encounter: Payer: Medicare Other | Admitting: *Deleted

## 2021-05-01 ENCOUNTER — Other Ambulatory Visit: Payer: Self-pay

## 2021-05-01 DIAGNOSIS — I5022 Chronic systolic (congestive) heart failure: Secondary | ICD-10-CM

## 2021-05-01 NOTE — Progress Notes (Signed)
Daily Session Note  Patient Details  Name: Jose Ray MRN: [id number] Date of Birth: January 19, 1941 Referring Provider:   Flowsheet Row Pulmonary Rehab from 03/30/2021 in [facility] Cardiac and Pulmonary Rehab  Referring Provider [name]       Encounter Date: 05/01/2021  Check In:  Session Check In - 05/01/21 1122       Check-In   Supervising physician immediately available to respond to emergencies See telemetry face sheet for immediately available ER MD    Location [facility]Cardiac & Pulmonary Rehab    Staff Present Renita Papa, RN BSN;Joseph [place]aughlin, RCP,RRT,BSRT;Jessica [place], [place], RCEP, CCRP, CCET    Virtual Visit No    Medication changes reported     No    Fall or balance concerns reported    No    Warm-up and Cool-down Performed on first and last piece of equipment    Resistance Training Performed Yes    VAD Patient? No    PAD/SET Patient? No      Pain Assessment   Currently in Pain? No/denies                Social History   Tobacco Use  Smoking Status Former   Packs/day: 1.00   Years: 35.00   Pack years: 35.00   Types: Cigarettes   Quit date: 1995   Years since quitting: 27.5  Smokeless Tobacco Never    Goals Met:  Independence with exercise equipment Exercise tolerated well No report of cardiac concerns or symptoms Strength training completed today  Goals Unmet:  Not Applicable  Comments: Pt able to follow exercise prescription today without complaint.  Will continue to monitor for progression.    Dr. Emily Filbert is Medical Director for [place].  Dr. Ottie Glazier is Medical Director for [facility] Pulmonary Rehabilitation.

## 2021-05-04 ENCOUNTER — Other Ambulatory Visit: Payer: Self-pay

## 2021-05-04 ENCOUNTER — Encounter: Payer: Medicare Other | Admitting: *Deleted

## 2021-05-04 DIAGNOSIS — I5022 Chronic systolic (congestive) heart failure: Secondary | ICD-10-CM

## 2021-05-04 NOTE — Progress Notes (Signed)
Daily Session Note  Patient Details  Name: Jose Ray MRN: [id number] Date of Birth: Oct 07, 1941 Referring Provider:   Flowsheet Row Pulmonary Rehab from 03/30/2021 in [facility] Cardiac and Pulmonary Rehab  Referring Provider [name]       Encounter Date: 05/04/2021  Check In:  Session Check In - 05/04/21 1112       Check-In   Supervising physician immediately available to respond to emergencies See telemetry face sheet for immediately available ER MD    Location [facility]Cardiac & Pulmonary Rehab    Staff Present Renita Papa, RN BSN;Joseph Tessie Fass, RCP,RRT,BSRT;Kelly [place], MPA, Mauricia [place], BS, ACSM CEP, Exercise Physiologist    Virtual Visit No    Medication changes reported     No    Fall or balance concerns reported    No    Warm-up and Cool-down Performed on first and last piece of equipment    Resistance Training Performed Yes    VAD Patient? No    PAD/SET Patient? No      Pain Assessment   Currently in Pain? No/denies                Social History   Tobacco Use  Smoking Status Former   Packs/day: 1.00   Years: 35.00   Pack years: 35.00   Types: Cigarettes   Quit date: 1995   Years since quitting: 27.5  Smokeless Tobacco Never    Goals Met:  Independence with exercise equipment Exercise tolerated well No report of cardiac concerns or symptoms Strength training completed today  Goals Unmet:  Not Applicable  Comments: Pt able to follow exercise prescription today without complaint.  Will continue to monitor for progression.    Dr. Emily Filbert is Medical Director for [place].  Dr. Ottie Glazier is Medical Director for [facility] Pulmonary Rehabilitation.

## 2021-05-06 ENCOUNTER — Other Ambulatory Visit: Payer: Self-pay

## 2021-05-06 DIAGNOSIS — I5022 Chronic systolic (congestive) heart failure: Secondary | ICD-10-CM | POA: Diagnosis not present

## 2021-05-06 NOTE — Progress Notes (Signed)
Daily Session Note  Patient Details  Name: Jose Ray MRN: [id number] Date of Birth: 1941/05/16 Referring Provider:   Flowsheet Row Pulmonary Rehab from 03/30/2021 in [facility] Cardiac and Pulmonary Rehab  Referring Provider [name]       Encounter Date: 05/06/2021  Check In:  Session Check In - 05/06/21 1051       Check-In   Supervising physician immediately available to respond to emergencies See telemetry face sheet for immediately available ER MD    Location [facility]Cardiac & Pulmonary Rehab    Staff Present Birdie Sons, MPA, RN;Melissa [place], RDN, Rowe Pavy, BA, ACSM CEP, Exercise Physiologist;Joseph [place], [place]    Virtual Visit No    Medication changes reported     No    Fall or balance concerns reported    No    Warm-up and Cool-down Performed on first and last piece of equipment    Resistance Training Performed Yes    VAD Patient? No    PAD/SET Patient? No      Pain Assessment   Currently in Pain? No/denies                Social History   Tobacco Use  Smoking Status Former   Packs/day: 1.00   Years: 35.00   Pack years: 35.00   Types: Cigarettes   Quit date: 1995   Years since quitting: 27.5  Smokeless Tobacco Never    Goals Met:  Independence with exercise equipment Exercise tolerated well No report of cardiac concerns or symptoms Strength training completed today  Goals Unmet:  Not Applicable  Comments: Pt able to follow exercise prescription today without complaint.  Will continue to monitor for progression.    Dr. Emily Filbert is Medical Director for [place].  Dr. Ottie Glazier is Medical Director for [facility] Pulmonary Rehabilitation.

## 2021-05-08 ENCOUNTER — Encounter: Payer: Medicare Other | Admitting: *Deleted

## 2021-05-08 ENCOUNTER — Other Ambulatory Visit: Payer: Self-pay

## 2021-05-08 DIAGNOSIS — I5022 Chronic systolic (congestive) heart failure: Secondary | ICD-10-CM | POA: Diagnosis not present

## 2021-05-08 NOTE — Progress Notes (Signed)
Daily Session Note  Patient Details  Name: Jose Ray MRN: [id number] Date of Birth: 1941/05/24 Referring Provider:   Flowsheet Row Pulmonary Rehab from 03/30/2021 in [facility] Cardiac and Pulmonary Rehab  Referring Provider [name]       Encounter Date: 05/08/2021  Check In:  Session Check In - 05/08/21 1139       Check-In   Supervising physician immediately available to respond to emergencies See telemetry face sheet for immediately available ER MD    Location [facility]Card[facility] & Pulmonary Rehab    Staff Present Renita Papa, RN BSN;Joseph Tessie Fass, RCP,RRT,BSRT;Amanda [place], [place], ACSM CEP, Exercise Physiologist    Virtual Visit No    Medication changes reported     No    Fall or balance concerns reported    No    Warm-up and Cool-down Performed on first and last piece of equipment    Resistance Training Performed Yes    VAD Patient? No    PAD/SET Patient? No      Pain Assessment   Currently in Pain? No/denies                Social History   Tobacco Use  Smoking Status Former   Packs/day: 1.00   Years: 35.00   Pack years: 35.00   Types: Cigarettes   Quit date: 1995   Years since quitting: 27.5  Smokeless Tobacco Never    Goals Met:  Independence with exercise equipment Exercise tolerated well No report of cardiac concerns or symptoms Strength training completed today  Goals Unmet:  Not Applicable  Comments: Pt able to follow exercise prescription today without complaint.  Will continue to monitor for progression.    Dr. Emily Filbert is Medical Director for [place].  Dr. Ottie Glazier is Medical Director for [facility] Pulmonary Rehabilitation.

## 2021-05-11 ENCOUNTER — Other Ambulatory Visit: Payer: Self-pay

## 2021-05-11 ENCOUNTER — Encounter: Payer: Medicare Other | Attending: Internal Medicine | Admitting: *Deleted

## 2021-05-11 DIAGNOSIS — I5022 Chronic systolic (congestive) heart failure: Secondary | ICD-10-CM | POA: Insufficient documentation

## 2021-05-11 NOTE — Progress Notes (Signed)
Daily Session Note  Patient Details  Name: Jose Ray MRN: [id number] Date of Birth: 02/16/1941 Referring Provider:   Flowsheet Row Pulmonary Rehab from 03/30/2021 in [facility] Cardiac and Pulmonary Rehab  Referring Provider [name]       Encounter Date: 05/11/2021  Check In:  Session Check In - 05/11/21 1112       Check-In   Supervising physician immediately available to respond to emergencies See telemetry face sheet for immediately available ER MD    Location [facility]-Cardiac & Pulmonary Rehab    Staff Present Renita Papa, RN BSN;Joseph [place], RCP,RRT,BSRT;Kelly [place], [place], ACSM CEP, Exercise Physiologist;Kelly Rosalia Hammers, MPA, RN    Virtual Visit No    Medication changes reported     No    Fall or balance concerns reported    No    Warm-up and Cool-down Performed on first and last piece of equipment    Resistance Training Performed Yes    VAD Patient? No    PAD/SET Patient? No      Pain Assessment   Currently in Pain? No/denies                Social History   Tobacco Use  Smoking Status Former   Packs/day: 1.00   Years: 35.00   Pack years: 35.00   Types: Cigarettes   Quit date: 1995   Years since quitting: 27.6  Smokeless Tobacco Never    Goals Met:  Independence with exercise equipment Exercise tolerated well No report of cardiac concerns or symptoms Strength training completed today  Goals Unmet:  Not Applicable  Comments: Pt able to follow exercise prescription today without complaint.  Will continue to monitor for progression.    Dr. Emily Filbert is Medical Director for [place].  Dr. Ottie Glazier is Medical Director for [facility] Pulmonary Rehabilitation.

## 2021-05-15 ENCOUNTER — Other Ambulatory Visit: Payer: Self-pay

## 2021-05-15 ENCOUNTER — Encounter: Payer: Medicare Other | Admitting: *Deleted

## 2021-05-15 DIAGNOSIS — I5022 Chronic systolic (congestive) heart failure: Secondary | ICD-10-CM | POA: Diagnosis not present

## 2021-05-15 NOTE — Progress Notes (Signed)
Daily Session Note  Patient Details  Name: Jose Ray MRN: [id number] Date of Birth: 1940-12-05 Referring Provider:   Flowsheet Row Pulmonary Rehab from 03/30/2021 in [facility] Cardiac and Pulmonary Rehab  Referring Provider [name]       Encounter Date: 05/15/2021  Check In:  Session Check In - 05/15/21 1108       Check-In   Supervising physician immediately available to respond to emergencies See telemetry face sheet for immediately available ER MD    Location [facility]-Cardiac & Pulmonary Rehab    Staff Present Renita Papa, RN BSN;Jessica Luan [place], MA, RCEP, CCRP, CCET    Virtual Visit No    Medication changes reported     No    Fall or balance concerns reported    No    Warm-up and Cool-down Performed on first and last piece of equipment    Resistance Training Performed Yes    VAD Patient? No    PAD/SET Patient? No      Pain Assessment   Currently in Pain? No/denies                Social History   Tobacco Use  Smoking Status Former   Packs/day: 1.00   Years: 35.00   Pack years: 35.00   Types: Cigarettes   Quit date: 1995   Years since quitting: 27.6  Smokeless Tobacco Never    Goals Met:  Independence with exercise equipment Exercise tolerated well No report of cardiac concerns or symptoms Strength training completed today  Goals Unmet:  Not Applicable  Comments: Pt able to follow exercise prescription today without complaint.  Will continue to monitor for progression.    Dr. Emily Filbert is Medical Director for [place].  Dr. Ottie Glazier is Medical Director for [facility] Pulmonary Rehabilitation.

## 2021-05-18 ENCOUNTER — Other Ambulatory Visit: Payer: Self-pay

## 2021-05-18 ENCOUNTER — Encounter: Payer: Medicare Other | Admitting: *Deleted

## 2021-05-18 DIAGNOSIS — I5022 Chronic systolic (congestive) heart failure: Secondary | ICD-10-CM | POA: Diagnosis not present

## 2021-05-18 NOTE — Progress Notes (Signed)
Daily Session Note  Patient Details  Name: BLAYDEN CONWELL MRN: [id number] Date of Birth: 11/15/1940 Referring Provider:   Flowsheet Row Pulmonary Rehab from 03/30/2021 in [facility] Cardiac and Pulmonary Rehab  Referring Provider [name]       Encounter Date: 05/18/2021  Check In:  Session Check In - 05/18/21 1157       Check-In   Supervising physician immediately available to respond to emergencies See telemetry face sheet for immediately available ER MD    Location [facility]-Cardiac & Pulmonary Rehab    Staff Present Heath Lark, RN, BSN, CCRP;Kelly [place], MPA, RN;Amanda Sommer, BA, ACSM CEP, Exercise Physiologist;Kara Eliezer Bottom, MS, ASCM CEP, Exercise Physiologist    Virtual Visit No    Medication changes reported     No    Fall or balance concerns reported    No    Warm-up and Cool-down Performed on first and last piece of equipment    Resistance Training Performed Yes    VAD Patient? No    PAD/SET Patient? No      Pain Assessment   Currently in Pain? No/denies                Social History   Tobacco Use  Smoking Status Former   Packs/day: 1.00   Years: 35.00   Pack years: 35.00   Types: Cigarettes   Quit date: 1995   Years since quitting: 27.6  Smokeless Tobacco Never    Goals Met:  Proper associated with RPD/PD & O2 Sat Independence with exercise equipment Exercise tolerated well No report of cardiac concerns or symptoms  Goals Unmet:  Not Applicable  Comments: Pt able to follow exercise prescription today without complaint.  Will continue to monitor for progression.    Dr. Emily Filbert is Medical Director for [place].  Dr. Ottie Glazier is Medical Director for [facility] Pulmonary Rehabilitation.

## 2021-05-20 ENCOUNTER — Other Ambulatory Visit: Payer: Self-pay

## 2021-05-20 ENCOUNTER — Encounter: Payer: Self-pay | Admitting: *Deleted

## 2021-05-20 DIAGNOSIS — I5022 Chronic systolic (congestive) heart failure: Secondary | ICD-10-CM

## 2021-05-20 NOTE — Progress Notes (Signed)
Daily Session Note  Patient Details  Name: Jose Ray MRN: [id number] Date of Birth: 12/28/1940 Referring Provider:   Flowsheet Row Pulmonary Rehab from 03/30/2021 in [facility] Cardiac and Pulmonary Rehab  Referring Provider [name]       Encounter Date: 05/20/2021  Check In:  Session Check In - 05/20/21 1041       Check-In   Supervising physician immediately available to respond to emergencies See telemetry face sheet for immediately available ER MD    Location [facility]-Cardiac & Pulmonary Rehab    Staff Present Birdie Sons, MPA, Nino Glow, MS, ASCM CEP, Exercise Physiologist;Amanda Oletta Darter, BA, ACSM CEP, Exercise Physiologist    Virtual Visit No    Medication changes reported     No    Fall or balance concerns reported    No    Warm-up and Cool-down Performed on first and last piece of equipment    Resistance Training Performed Yes    VAD Patient? No    PAD/SET Patient? No      Pain Assessment   Currently in Pain? No/denies                Social History   Tobacco Use  Smoking Status Former   Packs/day: 1.00   Years: 35.00   Pack years: 35.00   Types: Cigarettes   Quit date: 1995   Years since quitting: 27.6  Smokeless Tobacco Never    Goals Met:  Independence with exercise equipment Exercise tolerated well No report of cardiac concerns or symptoms Strength training completed today  Goals Unmet:  Not Applicable  Comments: Pt able to follow exercise prescription today without complaint.  Will continue to monitor for progression.    Dr. Emily Filbert is Medical Director for [place].  Dr. Ottie Glazier is Medical Director for [facility] Pulmonary Rehabilitation.

## 2021-05-20 NOTE — Progress Notes (Signed)
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 (Patient not taking: Reported on 03/23/2021), Disp: , Rfl:    atorvastatin (LIPITOR) 80 MG tablet, Take 1 tablet by mouth at bedtime., Disp: , Rfl:    famotidine (PEPCID) 10 MG tablet, Take 10 mg by mouth daily., Disp: , Rfl:    fexofenadine (ALLEGRA) 180 MG tablet, Take 180 mg by mouth at bedtime., Disp: , Rfl:    fluticasone (FLONASE) 50 MCG/ACT nasal spray, Place 2 sprays into both nostrils at bedtime., Disp: , Rfl:    fluticasone-salmeterol (ADVAIR) 250-50 MCG/ACT AEPB, Advair Diskus 250 mcg-50 mcg/dose powder for inhalation (Patient not taking: Reported on 03/23/2021), Disp: , Rfl:    Fluticasone-Salmeterol (ADVAIR) 250-50 MCG/DOSE AEPB, Inhale 1 puff into the lungs 2 (two) times daily., Disp: , Rfl:    glipiZIDE (GLUCOTROL) 10 MG tablet, Take 10 mg by mouth daily before breakfast., Disp: , Rfl:    hydrochlorothiazide (HYDRODIURIL) 25 MG tablet, Take 25 mg by mouth daily., Disp: , Rfl:    hydrochlorothiazide (HYDRODIURIL) 25 MG tablet, Take 1 tablet by mouth daily. (Patient not taking: Reported on 03/23/2021), Disp: , Rfl:    icosapent Ethyl (VASCEPA) 1 g capsule, Take 2 g by mouth 2 (two) times daily., Disp: , Rfl:    isosorbide mononitrate (IMDUR) 30 MG 24 hr tablet, Take 30 mg by mouth daily., Disp: , Rfl:    losartan (COZAAR) 50 MG  tablet, Take 100 mg by mouth daily., Disp: , Rfl:    metFORMIN (GLUCOPHAGE) 1000 MG tablet, Take 1,000 mg by mouth 2 (two) times daily., Disp: , Rfl:    metoprolol succinate (TOPROL-XL) 100 MG 24 hr tablet, Take 50 mg by mouth daily. Take with or immediately following a meal. (Patient not taking: Reported on 03/23/2021), Disp: , Rfl:    metoprolol succinate (TOPROL-XL) 25 MG 24 hr tablet, Take 1 tablet by mouth daily., Disp: , Rfl:    Multiple Vitamins-Minerals (PRESERVISION AREDS 2) CAPS, Take 1 capsule by mouth at bedtime. (Patient not taking: Reported on 03/23/2021), Disp: , Rfl:    OXYGEN, Inhale 2.5-3 L into the lungs See admin instructions. Used while sleeping and physical activity, Disp: , Rfl:    Polyethyl Glycol-Propyl Glycol 0.4-0.3 % SOLN, Place 1 drop into both eyes See admin instructions. Instill 1 drop into both eyes at night, may use during the day as needed for dry eyes, Disp: , Rfl:    tiotropium (SPIRIVA) 18 MCG inhalation capsule, Place 18 mcg into inhaler and inhale daily., Disp: , Rfl:   Past Medical History: Past Medical History:  Diagnosis Date   Acute MI ([place])    CHF (congestive heart failure) (HCC)    COPD (chronic obstructive pulmonary disease) (HCC)    Coronary artery disease    Diabetes mellitus without complication (HCC)    GERD (gastroesophageal reflux disease)  History of hiatal hernia    Hypertension     Tobacco Use: Social History   Tobacco Use  Smoking Status Former   Packs/day: 1.00   Years: 35.00   Pack years: 35.00   Types: Cigarettes   Quit date: 1995   Years since quitting: 27.6  Smokeless Tobacco Never    Labs: Recent Review Advice worker     Labs for ITP Cardiac and Pulmonary Rehab Latest Ref Rng & Units 10/25/2016   Hemoglobin A1c 4.8 - 5.6 % 7.0(H)        Pulmonary Assessment Scores:  Pulmonary Assessment Scores     Row Name 03/30/21 1607         ADL UCSD   SOB Score total 60     Rest 1     Walk 3     Stairs 5      Bath 2     Dress 1     Shop 2           CAT Score     CAT Score 22           mMRC Score     mMRC Score 2             UCSD: Self-administered rating of dyspnea associated with activities of daily living (ADLs) 6-point scale (0 = "not at all" to 5 = "maximal or unable to do because of breathlessness")  Scoring Scores range from 0 to 120.  Minimally important difference is 5 units  CAT: CAT can identify the health impairment of COPD patients and is better correlated with disease progression.  CAT has a scoring range of zero to 40. The CAT score is classified into four groups of low (less than 10), medium (10 - 20), high (21-30) and very high (31-40) based on the impact level of disease on health status. A CAT score over 10 suggests significant symptoms.  A worsening CAT score could be explained by an exacerbation, poor medication adherence, poor inhaler technique, or progression of COPD or comorbid conditions.  CAT MCID is 2 points  mMRC: mMRC (Modified Medical Research Council) Dyspnea Scale is used to assess the degree of baseline functional disability in patients of respiratory disease due to dyspnea. No minimal important difference is established. A decrease in score of 1 point or greater is considered a positive change.   Pulmonary Function Assessment:  Pulmonary Function Assessment - 03/23/21 1343       Breath   Shortness of Breath Yes;Limiting activity             Exercise Target Goals: Exercise Program Goal: Individual exercise prescription set using results from initial 6 min walk test and THRR while considering  patient's activity barriers and safety.   Exercise Prescription Goal: Initial exercise prescription builds to 30-45 minutes a day of aerobic activity, 2-3 days per week.  Home exercise guidelines will be given to patient during program as part of exercise prescription that the participant will acknowledge.  Education: Aerobic Exercise: - Group verbal  and visual presentation on the components of exercise prescription. Introduces F.I.T.T principle from ACSM for exercise prescriptions.  Reviews F.I.T.T. principles of aerobic exercise including progression. Written material given at graduation.   Education: Resistance Exercise: - Group verbal and visual presentation on the components of exercise prescription. Introduces F.I.T.T principle from ACSM for exercise prescriptions  Reviews F.I.T.T. principles of resistance exercise including progression. Written material given at graduation. Flowsheet Row Pulmonary Rehab from 05/06/2021 in [facility]  Cardiac and Pulmonary Rehab  Date 04/08/21  [facility]  Instruction Review Code 1- Verbalizes Understanding        Education: Exercise & Equipment Safety: - Individual verbal instruction and demonstration of equipment use and safety with use of the equipment. Flowsheet [facility]  Date 03/30/21  Educator AS  Instruction Review Code 1- Verbalizes Understanding       Education: Exercise Physiology & General Exercise Guidelines: - Group verbal and written instruction with models to review the exercise physiology of the cardiovascular system and associated critical values. Provides general exercise guidelines with specific guidelines to those with heart or lung disease.    Education: Flexibility, Balance, Mind/Body Relaxation: - Group verbal and visual presentation with interactive activity on the components of exercise prescription. Introduces F.I.T.T principle from ACSM for exercise prescriptions. Reviews F.I.T.T. principles of flexibility and balance exercise training including progression. Also discusses the mind body connection.  Reviews various relaxation techniques to help reduce and manage stress (i.e. Deep breathing, progressive muscle relaxation, and visualization). Balance handout provided to take home. Written material given at  graduation. Flowsheet [facility]  Date 04/15/21  [facility]  Instruction Review Code 1- Verbalizes Understanding       Activity Barriers & Risk Stratification:   6 Minute Walk:  6 Minute Walk     Row Name 03/30/21 1554         6 Minute Walk   Phase Initial     Distance 925 feet     Walk Time 6 minutes     # of Rest Breaks 0     MPH 1.75     METS 2     RPE 17     Perceived Dyspnea  3     VO2 Peak 7     Symptoms No     Resting HR 95 bpm     Resting BP 124/54     Resting Oxygen Saturation  88 %     Exercise Oxygen Saturation  during 6 min walk 84 %     Max Ex. HR 108 bpm     Max Ex. BP 160/64     2 Minute Post BP 126/60           Interval HR     1 Minute HR 98     2 Minute HR 97     3 Minute HR 98     4 Minute HR 98     5 Minute HR 105     6 Minute HR 108     2 Minute Post HR 98     Interval Heart Rate? Yes           Interval Oxygen     Interval Oxygen? Yes     Baseline Oxygen Saturation % 88 %     1 Minute Oxygen Saturation % 87 %     1 Minute Liters of Oxygen 3 L     2 Minute Liters of Oxygen 3 L     3 Minute Oxygen Saturation % 88 %     3 Minute Liters of Oxygen 3 L     4 Minute Oxygen Saturation % 85 %     4 Minute Liters of Oxygen 3 L     5 Minute Oxygen Saturation % 85 %     5 Minute Liters of Oxygen 3 L  6 Minute Oxygen Saturation % 84 %     6 Minute Liters of Oxygen 3 L     2 Minute Post Liters of Oxygen 3 L            Oxygen Initial Assessment:  Oxygen Initial Assessment - 03/23/21 1341       Home Oxygen   Home Oxygen Device Home Concentrator;Portable Concentrator;E-Tanks    Sleep Oxygen Prescription Continuous    Liters per minute 3    Home Exercise Oxygen Prescription Continuous    Liters per minute 3    Home Resting Oxygen Prescription None    Liters per minute 0    Compliance with Home Oxygen Use Yes      Initial 6 min Walk   Oxygen Used Continuous    Liters  per minute 3      Program Oxygen Prescription   Program Oxygen Prescription Continuous    Liters per minute 3      Intervention   Short Term Goals To learn and exhibit compliance with exercise, home and travel O2 prescription;To learn and understand importance of monitoring SPO2 with pulse oximeter and demonstrate accurate use of the pulse oximeter.;To learn and understand importance of maintaining oxygen saturations>88%;To learn and demonstrate proper pursed lip breathing techniques or other breathing techniques. ;To learn and demonstrate proper use of respiratory medications    Long  Term Goals Exhibits compliance with exercise, home  and travel O2 prescription;Verbalizes importance of monitoring SPO2 with pulse oximeter and return demonstration;Maintenance of O2 saturations>88%;Exhibits proper breathing techniques, such as pursed lip breathing or other method taught during program session;Compliance with respiratory medication;Demonstrates proper use of MDI's             Oxygen Re-Evaluation:  Oxygen Re-Evaluation     Row Name 04/01/21 1104             Program Oxygen Prescription   Program Oxygen Prescription Continuous       Liters per minute 3               Home Oxygen     Home Oxygen Device Home Concentrator;Portable Concentrator;E-Tanks       Sleep Oxygen Prescription Continuous       Liters per minute 3       Home Exercise Oxygen Prescription Continuous       Liters per minute 3       Home Resting Oxygen Prescription None       Liters per minute 0       Compliance with Home Oxygen Use Yes               Goals/Expected Outcomes     Short Term Goals To learn and exhibit compliance with exercise, home and travel O2 prescription;To learn and understand importance of monitoring SPO2 with pulse oximeter and demonstrate accurate use of the pulse oximeter.;To learn and understand importance of maintaining oxygen saturations>88%;To learn and demonstrate proper pursed lip  breathing techniques or other breathing techniques.        Long  Term Goals Exhibits compliance with exercise, home  and travel O2 prescription;Verbalizes importance of monitoring SPO2 with pulse oximeter and return demonstration;Maintenance of O2 saturations>88%;Exhibits proper breathing techniques, such as pursed lip breathing or other method taught during program session       Comments Reviewed PLB technique with pt.  Talked about how it works and it's importance in maintaining their exercise saturations.       Goals/Expected Outcomes Short: Become  more profiecient at using PLB.   Long: Become independent at using PLB.               Oxygen Discharge (Final Oxygen Re-Evaluation):  Oxygen Re-Evaluation - 04/01/21 1104       Program Oxygen Prescription   Program Oxygen Prescription Continuous    Liters per minute 3      Home Oxygen   Home Oxygen Device Home Concentrator;Portable Concentrator;E-Tanks    Sleep Oxygen Prescription Continuous    Liters per minute 3    Home Exercise Oxygen Prescription Continuous    Liters per minute 3    Home Resting Oxygen Prescription None    Liters per minute 0    Compliance with Home Oxygen Use Yes      Goals/Expected Outcomes   Short Term Goals To learn and exhibit compliance with exercise, home and travel O2 prescription;To learn and understand importance of monitoring SPO2 with pulse oximeter and demonstrate accurate use of the pulse oximeter.;To learn and understand importance of maintaining oxygen saturations>88%;To learn and demonstrate proper pursed lip breathing techniques or other breathing techniques.     Long  Term Goals Exhibits compliance with exercise, home  and travel O2 prescription;Verbalizes importance of monitoring SPO2 with pulse oximeter and return demonstration;Maintenance of O2 saturations>88%;Exhibits proper breathing techniques, such as pursed lip breathing or other method taught during program session    Comments Reviewed PLB  technique with pt.  Talked about how it works and it's importance in maintaining their exercise saturations.    Goals/Expected Outcomes Short: Become more profiecient at using PLB.   Long: Become independent at using PLB.             Initial Exercise Prescription:  Initial Exercise Prescription - 03/30/21 1500       Date of Initial Exercise RX and Referring Provider   Date 03/30/21    Referring Provider Gwen Pounds      Oxygen   Oxygen Continuous    Liters 3      Treadmill   MPH 1.3    Grade 0    Minutes 15    METs 2      Recumbant Bike   Level 1    RPM 60    Minutes 15    METs 2      NuStep   Level 1    SPM 80    Minutes 15    METs 2      REL-XR   Level 1    Speed 50    Minutes 15    METs 2      T5 Nustep   Level 1    SPM 50    Minutes 15    METs 2      Prescription Details   Frequency (times per week) 3    Duration Progress to 30 minutes of continuous aerobic without signs/symptoms of physical distress      Intensity   THRR 40-80% of Max Heartrate 113-132    Ratings of Perceived Exertion 11-13    Perceived Dyspnea 0-4      Progression   Progression Continue to progress workloads to maintain intensity without signs/symptoms of physical distress.      Resistance Training   Training Prescription Yes    Weight 3 lb    Reps 10-15             Perform Capillary Blood Glucose checks as needed.  Exercise Prescription Changes:   Exercise Prescription Changes  Row Name 04/16/21 0900 04/29/21 1500 05/04/21 1100 05/13/21 1100       Response to Exercise   Blood Pressure (Admit) 132/68 122/64 -- 116/62    Blood Pressure (Exercise) 128/64 -- -- 152/64    Blood Pressure (Exit) 102/60 112/60 -- 114/60    Heart Rate (Admit) 75 bpm 88 bpm -- 80 bpm    Heart Rate (Exercise) 98 bpm 95 bpm -- 103 bpm    Heart Rate (Exit) 93 bpm 88 bpm -- 93 bpm    Oxygen Saturation (Admit) 92 % 97 % -- 90 %    Oxygen Saturation (Exercise) 90 % 88 % -- 89 %     Oxygen Saturation (Exit) 93 % 90 % -- 90 %    Rating of Perceived Exertion (Exercise) 13 12 -- 13    Perceived Dyspnea (Exercise) 1 2 -- 1    Symptoms none SOB -- --    Comments first full week of exercise -- -- --    Duration Progress to 30 minutes of  aerobic without signs/symptoms of physical distress Continue with 30 min of aerobic exercise without signs/symptoms of physical distress. -- Continue with 30 min of aerobic exercise without signs/symptoms of physical distress.    Intensity THRR unchanged THRR unchanged -- THRR unchanged         Progression        Progression Continue to progress workloads to maintain intensity without signs/symptoms of physical distress. Continue to progress workloads to maintain intensity without signs/symptoms of physical distress. -- Continue to progress workloads to maintain intensity without signs/symptoms of physical distress.    Average METs 2.06 2.38 -- 2         Resistance Training        Training Prescription Yes Yes -- Yes    Weight 5 lb 3 lb -- 3 lb    Reps 10-15 10-15 -- 10-15         Interval Training        Interval Training -- No -- No         Oxygen        Oxygen Continuous Continuous -- Continuous    Liters 3 3 -- 3         Treadmill        MPH 1.3 1.3 -- --    Grade 0 0 -- --    Minutes 15 15 -- --    METs 2 2 -- --         Recumbant Bike        Level 1 1 -- 1    Minutes 18 18 -- 15    METs 2.82 2.63 -- --         REL-XR        Level -- 2 -- --    Minutes -- 15 -- --    METs -- 3 -- --         T5 Nustep        Level 3 2 -- 3    Minutes 15 15 -- 15    METs 1.9 1.9 -- 2         Home Exercise Plan        Plans to continue exercise at -- -- Home (comment)  Walking, wants to join [employer] (comment)  Walking, wants to join [employer] -- -- Add 2 additional days to program exercise sessions.  Start with 1 day Add 2 additional days  to program exercise sessions.  Start with 1 day    Initial Home  Exercises Provided -- -- 05/04/21 05/04/21            Exercise Comments:   Exercise Comments     Row Name 04/01/21 1103           Exercise Comments First full day of exercise!  Patient was oriented to gym and equipment including functions, settings, policies, and procedures.  Patient's individual exercise prescription and treatment plan were reviewed.  All starting workloads were established based on the results of the 6 minute walk test done at initial orientation visit.  The plan for exercise progression was also introduced and progression will be customized based on patient's performance and goals.                Exercise Goals and Review:   Exercise Goals     Row Name 03/30/21 1617             Exercise Goals   Increase Physical Activity Yes       Intervention Provide advice, education, support and counseling about physical activity/exercise needs.;Develop an individualized exercise prescription for aerobic and resistive training based on initial evaluation findings, risk stratification, comorbidities and participant's personal goals.       Expected Outcomes Short Term: Attend rehab on a regular basis to increase amount of physical activity.;Long Term: Add in home exercise to make exercise part of routine and to increase amount of physical activity.;Long Term: Exercising regularly at least 3-5 days a week.       Increase Strength and Stamina Yes       Intervention Provide advice, education, support and counseling about physical activity/exercise needs.;Develop an individualized exercise prescription for aerobic and resistive training based on initial evaluation findings, risk stratification, comorbidities and participant's personal goals.       Expected Outcomes Short Term: Increase workloads from initial exercise prescription for resistance, speed, and METs.;Short Term: Perform resistance training exercises routinely during rehab and add in resistance training at home;Long  Term: Improve cardiorespiratory fitness, muscular endurance and strength as measured by increased METs and functional capacity ( )       Able to understand and use rate of perceived exertion (RPE) scale Yes       Intervention Provide education and explanation on how to use RPE scale       Expected Outcomes Short Term: Able to use RPE daily in rehab to express subjective intensity level;Long Term:  Able to use RPE to guide intensity level when exercising independently       Able to understand and use Dyspnea scale Yes       Intervention Provide education and explanation on how to use Dyspnea scale       Expected Outcomes Short Term: Able to use Dyspnea scale daily in rehab to express subjective sense of shortness of breath during exertion;Long Term: Able to use Dyspnea scale to guide intensity level when exercising independently       Knowledge and understanding of Target Heart Rate Range (THRR) Yes       Intervention Provide education and explanation of THRR including how the numbers were predicted and where they are located for reference       Expected Outcomes Short Term: Able to state/look up THRR;Short Term: Able to use daily as guideline for intensity in rehab;Long Term: Able to use THRR to govern intensity when exercising independently       Able to check pulse  independently Yes       Intervention Provide education and demonstration on how to check pulse in carotid and radial arteries.;Review the importance of being able to check your own pulse for safety during independent exercise       Expected Outcomes Short Term: Able to explain why pulse checking is important during independent exercise;Long Term: Able to check pulse independently and accurately       Understanding of Exercise Prescription Yes       Intervention Provide education, explanation, and written materials on patient's individual exercise prescription       Expected Outcomes Short Term: Able to explain program exercise  prescription;Long Term: Able to explain home exercise prescription to exercise independently                Exercise Goals Re-Evaluation :  Exercise Goals Re-Evaluation     Row Name 04/01/21 1104 04/16/21 0919 04/29/21 1538 05/04/21 1147 05/13/21 1117     Exercise Goal Re-Evaluation   Exercise Goals Review Increase Physical Activity;Able to understand and use rate of perceived exertion (RPE) scale;Knowledge and understanding of Target Heart Rate Range (THRR);Understanding of Exercise Prescription;Increase Strength and Stamina;Able to understand and use Dyspnea scale;Able to check pulse independently Increase Physical Activity;Increase Strength and Stamina;Understanding of Exercise Prescription Increase Physical Activity;Increase Strength and Stamina;Understanding of Exercise Prescription Increase Physical Activity;Increase Strength and Stamina;Understanding of Exercise Prescription Increase Physical Activity;Increase Strength and Stamina   Comments Reviewed RPE and dyspnea scales, THR and program prescription with pt today.  Pt voiced understanding and was given a copy of goals to take home. Osmond is off to a good start as last week was his first full week of exercise. He has already increased to level 3 on the T5 Nustep and 5 lbs for hand weights. Will continue to monitor progress. Jesusita Oka is doing well in rehab.  He is up to 18 watts on the bike.  We will continue to monitor his progress. Reviewed home exercise with pt today.  Pt plans to walk for exercise. Patient is thinking about joining the [employer] and was provided a brochure. He has a recumbant bike at home that he may try to make work again as it hasn't worked for a while.  Reviewed THR, pulse, RPE, sign and symptoms, pulse oximetery and when to call 911 or MD.  Also discussed weather considerations and indoor options.  Pt voiced understanding. Jesusita Oka is doing well.  His oxygen stays above 88% during exercise sessions.  He has increased level on  T5.  Staff wil encourage increasing weight for strength work   Expected Outcomes Short: Use RPE daily to regulate intensity. Long: Follow program prescription in THR. Short: Continue to build up speed on TM Long: Continue to increase overall strength and stamina Short: Move back up to level 3 on the T5  Long: Continue to improve stamina Short: Add on 1 day of exercise at home: Continue to exercise independently at home at appropriate prescription Short: maintain consistent attendance Long: continue to build stamina            Discharge Exercise Prescription (Final Exercise Prescription Changes):  Exercise Prescription Changes - 05/13/21 1100       Response to Exercise   Blood Pressure (Admit) 116/62    Blood Pressure (Exercise) 152/64    Blood Pressure (Exit) 114/60    Heart Rate (Admit) 80 bpm    Heart Rate (Exercise) 103 bpm    Heart Rate (Exit) 93 bpm    Oxygen  Saturation (Admit) 90 %    Oxygen Saturation (Exercise) 89 %    Oxygen Saturation (Exit) 90 %    Rating of Perceived Exertion (Exercise) 13    Perceived Dyspnea (Exercise) 1    Duration Continue with 30 min of aerobic exercise without signs/symptoms of physical distress.    Intensity THRR unchanged      Progression   Progression Continue to progress workloads to maintain intensity without signs/symptoms of physical distress.    Average METs 2      Resistance Training   Training Prescription Yes    Weight 3 lb    Reps 10-15      Interval Training   Interval Training No      Oxygen   Oxygen Continuous    Liters 3      Recumbant Bike   Level 1    Minutes 15      T5 Nustep   Level 3    Minutes 15    METs 2      Home Exercise Plan   Plans to continue exercise at Home (comment)   Walking, wants to join [employer] Add 2 additional days to program exercise sessions.   Start with 1 day   Initial Home Exercises Provided 05/04/21             Nutrition:  Target Goals: Understanding of nutrition  guidelines, daily intake of sodium 1500mg , cholesterol 200mg , calories 30% from fat and 7% or less from saturated fats, daily to have 5 or more servings of fruits and vegetables.  Education: All About Nutrition: -Group instruction provided by verbal, written material, interactive activities, discussions, models, and posters to present general guidelines for heart healthy nutrition including fat, fiber, MyPlate, the role of sodium in heart healthy nutrition, utilization of the nutrition label, and utilization of this knowledge for meal planning. Follow up email sent as well. Written material given at graduation. Flowsheet Row Pulmonary Rehab from 05/06/2021 in [facility] Cardiac and Pulmonary Rehab  Date 04/22/21  Educator [facility]  Instruction Review Code 1- Verbalizes Understanding       Biometrics:  Pre Biometrics - 03/31/21 1009       Pre Biometrics   Height 5' 11.5" (1.816 m)    Weight 213 lb 8 oz (96.8 kg)    BMI (Calculated) 29.37    Single Leg Stand 9.8 seconds              Nutrition Therapy Plan and Nutrition Goals:  Nutrition Therapy & Goals - 04/08/21 1248       Nutrition Therapy   Diet Heart healthy, low Na, diabetes friendly    Drug/Food Interactions Statins/Certain Fruits    Protein (specify units) 110-115g    Fiber 30 grams    Whole Grain Foods 3 servings    Saturated Fats 12 max. grams    Fruits and Vegetables 8 servings/day    Sodium 1.5 grams      Personal Nutrition Goals   Nutrition Goal ST: add snacks in between breakfast and dinner with protein and fat like fruit with nuts or bean salad LT: meet protein needs, eat consistent carbohydrates    Comments B: Toast (whole wheat) (2 slices with low fat cream cheese) S: round of cheese D: varies: vegetables and meat S: ice cream bar or sherbert. They just got a new monitor to see if it will work. A1C: a bit over 7 the last time he was here. They almost never eat fried food,  they go out to eat maybe once per week. He will  coook and he will does not typically use oil, but will put hamburger in baking dish and [place] dressing. He will also use pam to grease pan. He also does not cook with any salt. He will use truvia and less sugar when recipe calls for sugar. Discussed heart healthy eating and diabetes friendly eating.      Intervention Plan   Intervention Prescribe, educate and counsel regarding individualized specific dietary modifications aiming towards targeted core components such as weight, hypertension, lipid management, diabetes, heart failure and other comorbidities.;Nutrition handout(s) given to patient.    Expected Outcomes Short Term Goal: Understand basic principles of dietary content, such as calories, fat, sodium, cholesterol and nutrients.;Short Term Goal: A plan has been developed with personal nutrition goals set during dietitian appointment.;Long Term Goal: Adherence to prescribed nutrition plan.             Nutrition Assessments:  MEDIFICTS Score Key: ?70 Need to make dietary changes  40-70 Heart Healthy Diet ? 40 Therapeutic Level Cholesterol Diet  Flowsheet Row Pulmonary Rehab from 03/30/2021 in [facility] Cardiac and Pulmonary Rehab  Picture Your Plate Total Score on Admission 59      Picture Your Plate Scores: <67 Unhealthy dietary pattern with much room for improvement. 41-50 Dietary pattern unlikely to meet recommendations for good health and room for improvement. 51-60 More healthful dietary pattern, with some room for improvement.  >60 Healthy dietary pattern, although there may be some specific behaviors that could be improved.   Nutrition Goals Re-Evaluation:   Nutrition Goals Discharge (Final Nutrition Goals Re-Evaluation):   Psychosocial: Target Goals: Acknowledge presence or absence of significant depression and/or stress, maximize coping skills, provide positive support system. Participant is able to verbalize types and ability to use techniques and skills needed for  reducing stress and depression.   Education: Stress, Anxiety, and Depression - Group verbal and visual presentation to define topics covered.  Reviews how body is impacted by stress, anxiety, and depression.  Also discusses healthy ways to reduce stress and to treat/manage anxiety and depression.  Written material given at graduation.   Education: Sleep Hygiene -Provides group verbal and written instruction about how sleep can affect your health.  Define sleep hygiene, discuss sleep cycles and impact of sleep habits. Review good sleep hygiene tips.    Initial Review & Psychosocial Screening:  Initial Psych Review & Screening - 03/23/21 1346       Initial Review   Current issues with None Identified      Family Dynamics   Good Support System? Yes    Comments Zelig can look to his daughter that lives nearby and his wife for support. He states that his mental health is great.      Barriers   Psychosocial barriers to participate in program The patient should benefit from training in stress management and relaxation.;There are no identifiable barriers or psychosocial needs.      Screening Interventions   Interventions To provide support and resources with identified psychosocial needs;Provide feedback about the scores to participant;Encouraged to exercise    Expected Outcomes Short Term goal: Utilizing psychosocial counselor, staff and physician to assist with identification of specific Stressors or current issues interfering with healing process. Setting desired goal for each stressor or current issue identified.;Long Term Goal: Stressors or current issues are controlled or eliminated.;Short Term goal: Identification and review with participant of any Quality of Life or Depression concerns found by scoring  the questionnaire.;Long Term goal: The participant improves quality of Life and PHQ9 Scores as seen by post scores and/or verbalization of changes             Quality of Life  Scores:  Quality of Life - 03/30/21 1612       Quality of Life   Select Quality of Life      Quality of Life Scores   Health/Function Pre 21.07 %    Socioeconomic Pre 22.71 %    Psych/Spiritual Pre 20.86 %    Family Pre 24.6 %    GLOBAL Pre 21.88 %            Scores of 19 and below usually indicate a poorer quality of life in these areas.  A difference of  2-3 points is a clinically meaningful difference.  A difference of 2-3 points in the total score of the Quality of Life Index has been associated with significant improvement in overall quality of life, self-image, physical symptoms, and general health in studies assessing change in quality of life.  PHQ-9: Recent Review Flowsheet Data     Depression screen [facility] 2/9 04/29/2021 03/30/2021   Decreased Interest 0 0   Down, Depressed, Hopeless 0 0   PHQ - 2 Score 0 0   Altered sleeping 1 2    Tired, decreased energy 0 3   Change in appetite 0 1   Feeling bad or failure about yourself  0 0   Trouble concentrating 3 2   Moving slowly or fidgety/restless 0 2   Suicidal thoughts 0 0   PHQ-9 Score 4 10   Difficult doing work/chores Not difficult at all Somewhat difficult      Interpretation of Total Score  Total Score Depression Severity:  1-4 = Minimal depression, 5-9 = Mild depression, 10-14 = Moderate depression, 15-19 = Moderately severe depression, 20-27 = Severe depression   Psychosocial Evaluation and Intervention:   Psychosocial Re-Evaluation:  Psychosocial Re-Evaluation     Row Name 03/23/21 1347 04/29/21 1118 05/04/21 1130         Psychosocial Re-Evaluation   Current issues with None Identified None Identified None Identified     Comments Geovanny can look to his daughter that lives nearby and his wife for support. He states that his mental health is great. Reviewed patient health questionnaire (PHQ-9) with patient for follow up. Previously, patients score indicated signs/symptoms of depression.  Reviewed to see  if patient is improving symptom wise while in program.  Score improved and patient states that it is because he has been able to exercise and have a little more energy. Darshawn is doing very well mentally. He denies any problems, concerns, or issues. Sleep is good. He has good support from his wife- He does not take any medications for depression or anxiety. His breathing is better  as he feels like activites at home are easier to do than before starting the program. He is pleased thus far.     Expected Outcomes Short: Exercise regularly to support mental health and notify staff of any changes. Long: maintain mental health and well being through teaching of rehab or prescribed medications independently. Short: Continue to attend [facility] regularly for regular exercise and social engagement. Long: Continue to improve symptoms and manage a positive mental state. Short: Continue attendance with rehab Long: Continue to maintain positive attitude and exercise for stress management     Interventions Encouraged to attend Pulmonary Rehabilitation for the exercise Encouraged to attend Pulmonary  Rehabilitation for the exercise Encouraged to attend Pulmonary Rehabilitation for the exercise     Continue Psychosocial Services  Follow up required by staff Follow up required by staff --              Psychosocial Discharge (Final Psychosocial Re-Evaluation):  Psychosocial Re-Evaluation - 05/04/21 1130       Psychosocial Re-Evaluation   Current issues with None Identified    Comments Ky is doing very well mentally. He denies any problems, concerns, or issues. Sleep is good. He has good support from his wife- He does not take any medications for depression or anxiety. His breathing is better  as he feels like activites at home are easier to do than before starting the program. He is pleased thus far.    Expected Outcomes Short: Continue attendance with rehab Long: Continue to maintain positive attitude and  exercise for stress management    Interventions Encouraged to attend Pulmonary Rehabilitation for the exercise             Education: Education Goals: Education classes will be provided on a weekly basis, covering required topics. Participant will state understanding/return demonstration of topics presented.  Learning Barriers/Preferences:  Learning Barriers/Preferences - 03/23/21 1344       Learning Barriers/Preferences   Learning Barriers None    Learning Preferences None             General Pulmonary Education Topics:  Infection Prevention: - Provides verbal and written material to individual with discussion of infection control including proper hand washing and proper equipment cleaning during exercise session. Flowsheet Row Pulmonary Rehab from 05/06/2021 in [facility] Cardiac and Pulmonary Rehab  Date 03/30/21  Educator AS  Instruction Review Code 1- Verbalizes Understanding       Falls Prevention: - Provides verbal and written material to individual with discussion of falls prevention and safety. Flowsheet Row Pulmonary Rehab from 05/06/2021 in [facility] Cardiac and Pulmonary Rehab  Date 03/30/21  Educator AS  Instruction Review Code 1- Verbalizes Understanding       Chronic Lung Disease Review: - Group verbal instruction with posters, models, PowerPoint presentations and videos,  to review new updates, new respiratory medications, new advancements in procedures and treatments. Providing information on websites and "800" numbers for continued self-education. Includes information about supplement oxygen, available portable oxygen systems, continuous and intermittent flow rates, oxygen safety, concentrators, and Medicare reimbursement for oxygen. Explanation of Pulmonary Drugs, including class, frequency, complications, importance of spacers, rinsing mouth after steroid MDI's, and proper cleaning methods for nebulizers. Review of basic lung anatomy and physiology related to  function, structure, and complications of lung disease. Review of risk factors. Discussion about methods for diagnosing sleep apnea and types of masks and machines for OSA. Includes a review of the use of types of environmental controls: home humidity, furnaces, filters, dust mite/pet prevention, HEPA vacuums. Discussion about weather changes, air quality and the benefits of nasal washing. Instruction on Warning signs, infection symptoms, calling MD promptly, preventive modes, and value of vaccinations. Review of effective airway clearance, coughing and/or vibration techniques. Emphasizing that all should Create an Action Plan. Written material given at graduation.   AED/CPR: - Group verbal and written instruction with the use of models to demonstrate the basic use of the AED with the basic ABC's of resuscitation.    Anatomy and Cardiac Procedures: - Group verbal and visual presentation and models provide information about basic cardiac anatomy and function. Reviews the testing methods done to diagnose heart disease  and the outcomes of the test results. Describes the treatment choices: Medical Management, Angioplasty, or Coronary Bypass Surgery for treating various heart conditions including Myocardial Infarction, Angina, Valve Disease, and Cardiac Arrhythmias.  Written material given at graduation. Flowsheet Row Pulmonary Rehab from 05/06/2021 in [facility] Cardiac and Pulmonary Rehab  Date 04/08/21  Educator SB  Instruction Review Code 1- Verbalizes Understanding       Medication Safety: - Group verbal and visual instruction to review commonly prescribed medications for heart and lung disease. Reviews the medication, class of the drug, and side effects. Includes the steps to properly store meds and maintain the prescription regimen.  Written material given at graduation. Flowsheet Row Pulmonary Rehab from 05/06/2021 in [facility] Cardiac and Pulmonary Rehab  Date 04/29/21  Educator SB  Instruction Review  Code 1- Verbalizes Understanding       Other: -Provides group and verbal instruction on various topics (see comments)   Knowledge Questionnaire Score:  Knowledge Questionnaire Score - 03/30/21 1606       Knowledge Questionnaire Score   Pre Score 21/26 cardiac  14/18 pulmonary              Core Components/Risk Factors/Patient Goals at Admission:  Personal Goals and Risk Factors at Admission - 03/30/21 1614       Core Components/Risk Factors/Patient Goals on Admission    Weight Management Yes;Weight Maintenance;Weight Loss    Intervention Weight Management: Develop a combined nutrition and exercise program designed to reach desired caloric intake, while maintaining appropriate intake of nutrient and fiber, sodium and fats, and appropriate energy expenditure required for the weight goal.;Weight Management: Provide education and appropriate resources to help participant work on and attain dietary goals.;Weight Management/Obesity: Establish reasonable short term and long term weight goals.    Expected Outcomes Short Term: Continue to assess and modify interventions until short term weight is achieved;Long Term: Adherence to nutrition and physical activity/exercise program aimed toward attainment of established weight goal;Understanding recommendations for meals to include 15-35% energy as protein, 25-35% energy from fat, 35-60% energy from carbohydrates, less than  of dietary cholesterol, 20-35 gm of total fiber daily;Understanding of distribution of calorie intake throughout the day with the consumption of 4-5 meals/snacks;Weight Loss: Understanding of general recommendations for a balanced deficit meal plan, which promotes 1-2 lb weight loss per week and includes a negative energy balance of [id number] kcal/d    Improve shortness of breath with ADL's Yes    Intervention Provide education, individualized exercise plan and daily activity instruction to help decrease symptoms of SOB with  activities of daily living.    Expected Outcomes Short Term: Improve cardiorespiratory fitness to achieve a reduction of symptoms when performing ADLs;Long Term: Be able to perform more ADLs without symptoms or delay the onset of symptoms    Diabetes Yes    Intervention Provide education about signs/symptoms and action to take for hypo/hyperglycemia.;Provide education about proper nutrition, including hydration, and aerobic/resistive exercise prescription along with prescribed medications to achieve blood glucose in normal ranges: Fasting glucose 65-99 mg/dL    Expected Outcomes Short Term: Participant verbalizes understanding of the signs/symptoms and immediate care of hyper/hypoglycemia, proper foot care and importance of medication, aerobic/resistive exercise and nutrition plan for blood glucose control.;Long Term: Attainment of HbA1C < 7%.    Heart Failure Yes    Intervention Provide a combined exercise and nutrition program that is supplemented with education, support and counseling about heart failure. Directed toward relieving symptoms such as shortness of breath, decreased exercise tolerance,  and extremity edema.    Expected Outcomes Improve functional capacity of life;Short term: Attendance in program 2-3 days a week with increased exercise capacity. Reported lower sodium intake. Reported increased fruit and vegetable intake. Reports medication compliance.;Short term: Daily weights obtained and reported for increase. Utilizing diuretic protocols set by physician.;Long term: Adoption of self-care skills and reduction of barriers for early signs and symptoms recognition and intervention leading to self-care maintenance.    Hypertension Yes    Intervention Provide education on lifestyle modifcations including regular physical activity/exercise, weight management, moderate sodium restriction and increased consumption of fresh fruit, vegetables, and low fat dairy, alcohol moderation, and smoking  cessation.;Monitor prescription use compliance.    Expected Outcomes Short Term: Continued assessment and intervention until BP is < 140/24mm HG in hypertensive participants. < 130/32mm HG in hypertensive participants with diabetes, heart failure or chronic kidney disease.;Long Term: Maintenance of blood pressure at goal levels.    Lipids Yes    Intervention Provide education and support for participant on nutrition & aerobic/resistive exercise along with prescribed medications to achieve LDL 70mg , HDL >40mg .    Expected Outcomes Short Term: Participant states understanding of desired cholesterol values and is compliant with medications prescribed. Participant is following exercise prescription and nutrition guidelines.;Long Term: Cholesterol controlled with medications as prescribed, with individualized exercise RX and with personalized nutrition plan. Value goals: LDL < 70mg , HDL > 40 mg.             Education:Diabetes - Individual verbal and written instruction to review signs/symptoms of diabetes, desired ranges of glucose level fasting, after meals and with exercise. Acknowledge that pre and post exercise glucose checks will be done for 3 sessions at entry of program. Flowsheet Row Pulmonary Rehab from 03/23/2021 in [facility] Cardiac and Pulmonary Rehab  Date 03/23/21  Educator [facility]  Instruction Review Code 1- Verbalizes Understanding       Know Your Numbers and Heart Failure: - Group verbal and visual instruction to discuss disease risk factors for cardiac and pulmonary disease and treatment options.  Reviews associated critical values for Overweight/Obesity, Hypertension, Cholesterol, and Diabetes.  Discusses basics of heart failure: signs/symptoms and treatments.  Introduces Heart Failure Zone chart for action plan for heart failure.  Written material given at graduation. Flowsheet Row Pulmonary Rehab from 05/06/2021 in [facility] Cardiac and Pulmonary Rehab  Date 05/06/21  Educator [facility]   Instruction Review Code 1- Verbalizes Understanding       Core Components/Risk Factors/Patient Goals Review:   Goals and Risk Factor Review     Row Name 05/04/21 1124             Core Components/Risk Factors/Patient Goals Review   Personal Goals Review Weight Management/Obesity;Hypertension;Heart Failure       Review Kyri is doing well. He is maintaining his weight around 207 lb, trying to maintain weight and not lose at this time. He weighs at home and knows to note any significant weight gain. Denies any symptons with heart failure. We went over a heart failure hand out and symptoms to look out for. BP has been stable as he checks at home, he only checks it periodically and if/when he feels symptomatic. Ranges 110-120s systolic and 60s for diastolic.       Expected Outcomes Short: Continue to monitor weight and heart failure symptoms Long: Continue to manage lifestyle risk factors                Core Components/Risk Factors/Patient Goals at Discharge (Final Review):  Goals and Risk Factor Review - 05/04/21 1124       Core Components/Risk Factors/Patient Goals Review   Personal Goals Review Weight Management/Obesity;Hypertension;Heart Failure    Review Jaidin is doing well. He is maintaining his weight around 207 lb, trying to maintain weight and not lose at this time. He weighs at home and knows to note any significant weight gain. Denies any symptons with heart failure. We went over a heart failure hand out and symptoms to look out for. BP has been stable as he checks at home, he only checks it periodically and if/when he feels symptomatic. Ranges 110-120s systolic and 60s for diastolic.    Expected Outcomes Short: Continue to monitor weight and heart failure symptoms Long: Continue to manage lifestyle risk factors             ITP Comments:  ITP Comments     Row Name 03/23/21 1348 03/30/21 1624 04/01/21 1103 04/22/21 0908 05/20/21 0733   ITP Comments Virtual Visit  completed. Patient informed on EP and RD appointment and 6 Minute walk test. Patient also informed of patient health questionnaires on My Chart. Patient Verbalizes understanding. Visit diagnosis can [facility] found in [facility] 12[facility]. Completed and gym orientation. Initial ITP created and sent for review to Dr Vida Rigger Medical Director. First full day of exercise!  Patient was oriented to gym and equipment including functions, settings, policies, and procedures.  Patient's individual exercise prescription and treatment plan were reviewed.  All starting workloads were established based on the results of the 6 minute walk test done at initial orientation visit.  The plan for exercise progression was also introduced and progression will be customized based on patient's performance and goals. 30 Day review completed. Medical Director ITP review done, changes made as directed, and signed approval by Medical Director. 30 Day review completed. Medical Director ITP review done, changes made as directed, and signed approval by Medical Director.            Comments:

## 2021-05-22 ENCOUNTER — Other Ambulatory Visit: Payer: Self-pay

## 2021-05-22 ENCOUNTER — Encounter: Payer: Medicare Other | Admitting: *Deleted

## 2021-05-22 DIAGNOSIS — I5022 Chronic systolic (congestive) heart failure: Secondary | ICD-10-CM

## 2021-05-22 NOTE — Progress Notes (Signed)
Daily Session Note  Patient Details  Name: Jose Ray MRN: [id number] Date of Birth: 03/05/1941 Referring Provider:   Flowsheet Row Pulmonary Rehab from 03/30/2021 in [facility] Cardiac and Pulmonary Rehab  Referring Provider [name]       Encounter Date: 05/22/2021  Check In:  Session Check In - 05/22/21 1145       Check-In   Supervising physician immediately available to respond to emergencies See telemetry face sheet for immediately available ER MD    Staff Present Heath Lark, RN, BSN, CCRP;Melissa [place], RDN, LDN;Jessica [place], MA, RCEP, CCRP, CCET    Virtual Visit No    Medication changes reported     No    Fall or balance concerns reported    No    Warm-up and Cool-down Performed on first and last piece of equipment    Resistance Training Performed Yes    VAD Patient? No    PAD/SET Patient? No      Pain Assessment   Currently in Pain? No/denies                Social History   Tobacco Use  Smoking Status Former   Packs/day: 1.00   Years: 35.00   Pack years: 35.00   Types: Cigarettes   Quit date: 1995   Years since quitting: 27.6  Smokeless Tobacco Never    Goals Met:  Proper associated with RPD/PD & O2 Sat Independence with exercise equipment Exercise tolerated well No report of cardiac concerns or symptoms  Goals Unmet:  Not Applicable  Comments: Pt able to follow exercise prescription today without complaint.  Will continue to monitor for progression.    Dr. Emily Filbert is Medical Director for [place].  Dr. Ottie Glazier is Medical Director for [facility] Pulmonary Rehabilitation.

## 2021-05-25 ENCOUNTER — Other Ambulatory Visit: Payer: Self-pay

## 2021-05-25 ENCOUNTER — Encounter: Payer: Medicare Other | Admitting: *Deleted

## 2021-05-25 DIAGNOSIS — I5022 Chronic systolic (congestive) heart failure: Secondary | ICD-10-CM

## 2021-05-25 NOTE — Progress Notes (Signed)
Daily Session Note  Patient Details  Name: Jose Ray MRN: [id number] Date of Birth: 09/05/1941 Referring Provider:   Flowsheet Row Pulmonary Rehab from 03/30/2021 in [facility] Cardiac and Pulmonary Rehab  Referring Provider [name]       Encounter Date: 05/25/2021  Check In:  Session Check In - 05/25/21 1058       Check-In   Supervising physician immediately available to respond to emergencies See telemetry face sheet for immediately available ER MD    Location [facility]-Cardiac & Pulmonary Rehab    Staff Present Renita Papa, RN BSN;Joseph [place], RCP,RRT,BSRT;Kelly [place], BS, ACSM CEP, Exercise Physiologist;Kelly Rosalia Hammers, MPA, RN    Virtual Visit No    Medication changes reported     No    Fall or balance concerns reported    No    Warm-up and Cool-down Performed on first and last piece of equipment    Resistance Training Performed Yes    VAD Patient? No    PAD/SET Patient? No      Pain Assessment   Currently in Pain? No/denies                Social History   Tobacco Use  Smoking Status Former   Packs/day: 1.00   Years: 35.00   Pack years: 35.00   Types: Cigarettes   Quit date: 1995   Years since quitting: 27.6  Smokeless Tobacco Never    Goals Met:  Independence with exercise equipment Exercise tolerated well No report of cardiac concerns or symptoms Strength training completed today  Goals Unmet:  Not Applicable  Comments: Pt able to follow exercise prescription today without complaint.  Will continue to monitor for progression.    Dr. Emily Filbert is Medical Director for [place].  Dr. Ottie Glazier is Medical Director for [facility] Pulmonary Rehabilitation.

## 2021-05-27 ENCOUNTER — Other Ambulatory Visit: Payer: Self-pay

## 2021-05-27 DIAGNOSIS — I5022 Chronic systolic (congestive) heart failure: Secondary | ICD-10-CM

## 2021-05-27 NOTE — Progress Notes (Signed)
Daily Session Note  Patient Details  Name: Jose Ray MRN: [id number] Date of Birth: 29-Oct-1940 Referring Provider:   Flowsheet Row Pulmonary Rehab from 03/30/2021 in [facility] Cardiac and Pulmonary Rehab  Referring Provider [name]       Encounter Date: 05/27/2021  Check In:  Session Check In - 05/27/21 1034       Check-In   Supervising physician immediately available to respond to emergencies See telemetry face sheet for immediately available ER MD    Location [facility]-Cardiac & Pulmonary Rehab    Staff Present Birdie Sons, MPA, RN;Laureen Owens Shark, BS, RRT, CPFT;Joseph [place], [place]    Virtual Visit No    Medication changes reported     No    Fall or balance concerns reported    No    Warm-up and Cool-down Performed on first and last piece of equipment    Resistance Training Performed Yes    VAD Patient? No    PAD/SET Patient? No      Pain Assessment   Currently in Pain? No/denies                Social History   Tobacco Use  Smoking Status Former   Packs/day: 1.00   Years: 35.00   Pack years: 35.00   Types: Cigarettes   Quit date: 1995   Years since quitting: 27.6  Smokeless Tobacco Never    Goals Met:  Independence with exercise equipment Exercise tolerated well No report of cardiac concerns or symptoms Strength training completed today  Goals Unmet:  Not Applicable  Comments: Pt able to follow exercise prescription today without complaint.  Will continue to monitor for progression.    Dr. Emily Filbert is Medical Director for [place].  Dr. Ottie Glazier is Medical Director for [facility] Pulmonary Rehabilitation.

## 2021-05-29 ENCOUNTER — Encounter: Payer: Medicare Other | Admitting: *Deleted

## 2021-05-29 ENCOUNTER — Other Ambulatory Visit: Payer: Self-pay

## 2021-05-29 DIAGNOSIS — I5022 Chronic systolic (congestive) heart failure: Secondary | ICD-10-CM

## 2021-05-29 NOTE — Progress Notes (Signed)
Daily Session Note  Patient Details  Name: Jose Ray MRN: [id number] Date of Birth: 1941/03/30 Referring Provider:   Flowsheet Row Pulmonary Rehab from 03/30/2021 in [facility] Cardiac and Pulmonary Rehab  Referring Provider [name]       Encounter Date: 05/29/2021  Check In:  Session Check In - 05/29/21 1117       Check-In   Supervising physician immediately available to respond to emergencies See telemetry face sheet for immediately available ER MD    Location [facility]-Cardiac & Pulmonary Rehab    Staff Present Renita Papa, RN BSN;Joseph Tessie Fass, RCP,RRT,BSRT;Kelly [place], MPA, RN    Virtual Visit No    Medication changes reported     No    Fall or balance concerns reported    No    Warm-up and Cool-down Performed on first and last piece of equipment    Resistance Training Performed Yes    VAD Patient? No    PAD/SET Patient? No      Pain Assessment   Currently in Pain? No/denies                Social History   Tobacco Use  Smoking Status Former   Packs/day: 1.00   Years: 35.00   Pack years: 35.00   Types: Cigarettes   Quit date: 1995   Years since quitting: 27.6  Smokeless Tobacco Never    Goals Met:  Independence with exercise equipment Exercise tolerated well No report of cardiac concerns or symptoms Strength training completed today  Goals Unmet:  Not Applicable  Comments: Pt able to follow exercise prescription today without complaint.  Will continue to monitor for progression.    Dr. Emily Filbert is Medical Director for [place].  Dr. Ottie Glazier is Medical Director for [facility] Pulmonary Rehabilitation.

## 2021-06-01 ENCOUNTER — Other Ambulatory Visit: Payer: Self-pay

## 2021-06-01 ENCOUNTER — Encounter: Payer: Medicare Other | Admitting: *Deleted

## 2021-06-01 DIAGNOSIS — I5022 Chronic systolic (congestive) heart failure: Secondary | ICD-10-CM | POA: Diagnosis not present

## 2021-06-01 NOTE — Progress Notes (Signed)
Daily Session Note  Patient Details  Name: CHIOKE NOXON MRN: [id number] Date of Birth: 02-18-1941 Referring Provider:   Flowsheet Row Pulmonary Rehab from 03/30/2021 in [facility] Cardiac and Pulmonary Rehab  Referring Provider [name]       Encounter Date: 06/01/2021  Check In:  Session Check In - 06/01/21 1115       Check-In   Supervising physician immediately available to respond to emergencies See telemetry face sheet for immediately available ER MD    Location [facility]-Cardiac & Pulmonary Rehab    Staff Present Renita Papa, RN BSN;Joseph Tessie Fass, RCP,RRT,BSRT;Kelly [place], MPA, [place], BS, ACSM CEP, Exercise Physiologist    Virtual Visit No    Medication changes reported     No    Fall or balance concerns reported    No    Warm-up and Cool-down Performed on first and last piece of equipment    Resistance Training Performed Yes    VAD Patient? No    PAD/SET Patient? No      Pain Assessment   Currently in Pain? No/denies                Social History   Tobacco Use  Smoking Status Former   Packs/day: 1.00   Years: 35.00   Pack years: 35.00   Types: Cigarettes   Quit date: 1995   Years since quitting: 27.6  Smokeless Tobacco Never    Goals Met:  Independence with exercise equipment Exercise tolerated well No report of cardiac concerns or symptoms Strength training completed today  Goals Unmet:  Not Applicable  Comments: Pt able to follow exercise prescription today without complaint.  Will continue to monitor for progression.    Dr. Emily Filbert is Medical Director for [place].  Dr. Ottie Glazier is Medical Director for [facility] Pulmonary Rehabilitation.

## 2021-06-03 ENCOUNTER — Other Ambulatory Visit: Payer: Self-pay

## 2021-06-03 DIAGNOSIS — I5022 Chronic systolic (congestive) heart failure: Secondary | ICD-10-CM | POA: Diagnosis not present

## 2021-06-03 NOTE — Progress Notes (Signed)
Daily Session Note  Patient Details  Name: Jose Ray MRN: [id number] Date of Birth: 01-19-1941 Referring Provider:   Flowsheet Row Pulmonary Rehab from 03/30/2021 in [facility] Cardiac and Pulmonary Rehab  Referring Provider Jose Ray       Encounter Date: 06/03/2021  Check In:  Session Check In - 06/03/21 1047       Check-In   Supervising physician immediately available to respond to emergencies See telemetry face sheet for immediately available ER MD    Location [facility]Cardiac & Pulmonary Rehab    Staff Present Birdie Sons, MPA, RN;Melissa [place], RDN, Rowe Pavy, BA, ACSM CEP, Exercise Physiologist;Joseph [place], [place]    Virtual Visit No    Medication changes reported     No    Fall or balance concerns reported    No    Warm-up and Cool-down Performed on first and last piece of equipment    Resistance Training Performed Yes    VAD Patient? No    PAD/SET Patient? No      Pain Assessment   Currently in Pain? No/denies                Social History   Tobacco Use  Smoking Status Former   Packs/day: 1.00   Years: 35.00   Pack years: 35.00   Types: Cigarettes   Quit date: 1995   Years since quitting: 27.6  Smokeless Tobacco Never    Goals Met:  Independence with exercise equipment Exercise tolerated well No report of cardiac concerns or symptoms Strength training completed today  Goals Unmet:  Not Applicable  Comments: Pt able to follow exercise prescription today without complaint.  Will continue to monitor for progression.    Dr. Emily Filbert is Medical Director for [place].  Dr. Ottie Glazier is Medical Director for [facility] Pulmonary Rehabilitation.

## 2021-06-05 ENCOUNTER — Other Ambulatory Visit: Payer: Self-pay

## 2021-06-05 ENCOUNTER — Encounter: Payer: Medicare Other | Admitting: *Deleted

## 2021-06-05 DIAGNOSIS — I5022 Chronic systolic (congestive) heart failure: Secondary | ICD-10-CM

## 2021-06-05 NOTE — Progress Notes (Signed)
Daily Session Note  Patient Details  Name: Jose Ray MRN: [id number] Date of Birth: 07/13/1941 Referring Provider:   Flowsheet Row Pulmonary Rehab from 03/30/2021 in [facility] Cardiac and Pulmonary Rehab  Referring Provider [name]       Encounter Date: 06/05/2021  Check In:  Session Check In - 06/05/21 1057       Check-In   Supervising physician immediately available to respond to emergencies See telemetry face sheet for immediately available ER MD    Location [facility]Cardiac & Pulmonary Rehab    Staff Present Renita Papa, RN BSN;Joseph [place], RCP,RRT,BSRT;Jessica [place], [place], RCEP, CCRP, CCET    Virtual Visit No    Medication changes reported     No    Fall or balance concerns reported    No    Warm-up and Cool-down Performed on first and last piece of equipment    Resistance Training Performed Yes    VAD Patient? No    PAD/SET Patient? No      Pain Assessment   Currently in Pain? No/denies                Social History   Tobacco Use  Smoking Status Former   Packs/day: 1.00   Years: 35.00   Pack years: 35.00   Types: Cigarettes   Quit date: 1995   Years since quitting: 27.6  Smokeless Tobacco Never    Goals Met:  Independence with exercise equipment Exercise tolerated well No report of concerns or symptoms today Strength training completed today  Goals Unmet:  Not Applicable  Comments: Pt able to follow exercise prescription today without complaint.  Will continue to monitor for progression.    Dr. Emily Filbert is Medical Director for [place].  Dr. Ottie Glazier is Medical Director for [facility] Pulmonary Rehabilitation.

## 2021-06-08 ENCOUNTER — Encounter: Payer: Medicare Other | Admitting: *Deleted

## 2021-06-08 ENCOUNTER — Other Ambulatory Visit: Payer: Self-pay

## 2021-06-08 DIAGNOSIS — I5022 Chronic systolic (congestive) heart failure: Secondary | ICD-10-CM

## 2021-06-08 NOTE — Progress Notes (Signed)
Daily Session Note  Patient Details  Name: Jose Ray MRN: [id number] Date of Birth: July 15, 1941 Referring Provider:   Flowsheet Row Pulmonary Rehab from 03/30/2021 in [facility] Cardiac and Pulmonary Rehab  Referring Provider [name]       Encounter Date: 06/08/2021  Check In:  Session Check In - 06/08/21 1113       Check-In   Supervising physician immediately available to respond to emergencies See telemetry face sheet for immediately available ER MD    Location [facility]-Cardiac & Pulmonary Rehab    Staff Present Renita Papa, RN Moises Blood, BS, ACSM CEP, Exercise Physiologist;Joseph Tessie Fass, Fabio [place], MPA, RN    Virtual Visit No    Medication changes reported     No    Fall or balance concerns reported    No    Warm-up and Cool-down Performed on first and last piece of equipment    Resistance Training Performed Yes    VAD Patient? No    PAD/SET Patient? No      Pain Assessment   Currently in Pain? No/denies                Social History   Tobacco Use  Smoking Status Former   Packs/day: 1.00   Years: 35.00   Pack years: 35.00   Types: Cigarettes   Quit date: 1995   Years since quitting: 27.6  Smokeless Tobacco Never    Goals Met:  Independence with exercise equipment Exercise tolerated well No report of concerns or symptoms today Strength training completed today  Goals Unmet:  Not Applicable  Comments: Pt able to follow exercise prescription today without complaint.  Will continue to monitor for progression.    Dr. Emily Filbert is Medical Director for [place].  Dr. Ottie Glazier is Medical Director for [facility] Pulmonary Rehabilitation.

## 2021-06-10 ENCOUNTER — Other Ambulatory Visit: Payer: Self-pay

## 2021-06-10 DIAGNOSIS — I5022 Chronic systolic (congestive) heart failure: Secondary | ICD-10-CM | POA: Diagnosis not present

## 2021-06-10 NOTE — Progress Notes (Signed)
Daily Session Note  Patient Details  Name: Jose Ray MRN: [id number] Date of Birth: 1941-09-23 Referring Provider:   Flowsheet Row Pulmonary Rehab from 03/30/2021 in [facility] Cardiac and Pulmonary Rehab  Referring Provider [name]       Encounter Date: 06/10/2021  Check In:  Session Check In - 06/10/21 1117       Check-In   Supervising physician immediately available to respond to emergencies See telemetry face sheet for immediately available ER MD    Location [facility]Cardiac & Pulmonary Rehab    Staff Present Birdie Sons, MPA, RN;Melissa [place], RDN, LDN;Jessica [place], MA, RCEP, CCRP, CCET;Amanda Sommer, BA, ACSM CEP, Exercise Physiologist;Joseph [place], [place]    Virtual Visit No    Medication changes reported     No    Fall or balance concerns reported    No    Warm-up and Cool-down Performed on first and last piece of equipment    Resistance Training Performed Yes    VAD Patient? No    PAD/SET Patient? No      Pain Assessment   Currently in Pain? No/denies                Social History   Tobacco Use  Smoking Status Former   Packs/day: 1.00   Years: 35.00   Pack years: 35.00   Types: Cigarettes   Quit date: 1995   Years since quitting: 27.6  Smokeless Tobacco Never    Goals Met:  Independence with exercise equipment Exercise tolerated well No report of concerns or symptoms today Strength training completed today  Goals Unmet:  Not Applicable  Comments: Pt able to follow exercise prescription today without complaint.  Will continue to monitor for progression.    Dr. Emily Filbert is Medical Director for [place].  Dr. Ottie Glazier is Medical Director for [facility] Pulmonary Rehabilitation.

## 2021-06-12 ENCOUNTER — Other Ambulatory Visit: Payer: Self-pay

## 2021-06-12 ENCOUNTER — Encounter: Payer: Medicare Other | Attending: Internal Medicine | Admitting: *Deleted

## 2021-06-12 VITALS — Ht 71.5 in | Wt 220.4 lb

## 2021-06-12 DIAGNOSIS — I5022 Chronic systolic (congestive) heart failure: Secondary | ICD-10-CM | POA: Diagnosis present

## 2021-06-12 NOTE — Progress Notes (Signed)
Daily Session Note  Patient Details  Name: Jose Ray MRN: [id number] Date of Birth: [birth year] Referring Provider:   Flowsheet Row Pulmonary Rehab from 03/30/2021 in [facility] Cardiac and Pulmonary Rehab  Referring Provider [name]       Encounter Date: 06/12/2021  Check In:  Session Check In - 06/12/21 1111       Check-In   Supervising physician immediately available to respond to emergencies See telemetry face sheet for immediately available ER MD    Location [facility]-Cardiac & Pulmonary Rehab    Staff Present Renita Papa, RN BSN;Joseph [place], RCP,RRT,BSRT;Jessica [place], [place], RCEP, CCRP, CCET    Virtual Visit No    Medication changes reported     No    Fall or balance concerns reported    No    Warm-up and Cool-down Performed on first and last piece of equipment    Resistance Training Performed Yes    VAD Patient? No    PAD/SET Patient? No      Pain Assessment   Currently in Pain? No/denies                Social History   Tobacco Use  Smoking Status Former   Packs/day: 1.00   Years: 35.00   Pack years: 35.00   Types: Cigarettes   Quit date: 1995   Years since quitting: 27.6  Smokeless Tobacco Never    Goals Met:  Independence with exercise equipment Exercise tolerated well No report of concerns or symptoms today Strength training completed today  Goals Unmet:  Not Applicable  Comments: Pt able to follow exercise prescription today without complaint.  Will continue to monitor for progression.   [place] Name 03/30/21 1554 06/12/21 1121       6 Minute Walk   Phase Initial Discharge    Distance 925 feet 1120 feet    Distance % Change -- 21.1 %    Distance Feet Change -- 195 ft    Walk Time 6 minutes 6 minutes    # of Rest Breaks 0 0    MPH 1.75 2.12    METS 2 2.21    RPE 17 15    Perceived Dyspnea  3 3    VO2 Peak 7 7.75    Symptoms No Yes (comment)    Comments -- SOB    Resting HR 95 bpm 85 bpm    Resting BP 124/54 128/64     Resting Oxygen Saturation  88 % 95 %    Exercise Oxygen Saturation  during 6 min walk 84 % 82 %    Max Ex. HR 108 bpm 110 bpm    Max Ex. BP 160/64 148/74    2 Minute Post BP 126/60 130/70         Interval HR   1 Minute HR 98 102    2 Minute HR 97 105    3 Minute HR 98 104    4 Minute HR 98 106    5 Minute HR 105 110    6 Minute HR 108 108    2 Minute Post HR 98 92    Interval Heart Rate? Yes Yes         Interval Oxygen   Interval Oxygen? Yes Yes    Baseline Oxygen Saturation % 88 % 95 %    1 Minute Oxygen Saturation % 87 % 84 %    1 Minute Liters of Oxygen 3 L 3 L  pulsed concentrator    2 Minute Oxygen Saturation % -- 83 %    2 Minute Liters of Oxygen 3 L 0 L    3 Minute Oxygen Saturation % 88 % 83 %    3 Minute Liters of Oxygen 3 L 0 L    4 Minute Oxygen Saturation % 85 % 84 %    4 Minute Liters of Oxygen 3 L 0 L    5 Minute Oxygen Saturation % 85 % 83 %    5 Minute Liters of Oxygen 3 L 0 L    6 Minute Oxygen Saturation % 84 % 82 %    6 Minute Liters of Oxygen 3 L 0 L    2 Minute Post Oxygen Saturation % -- 90 %    2 Minute Post Liters of Oxygen 3 L 0 L              Dr. Emily Filbert is Medical Director for [place].  Dr. Ottie Glazier is Medical Director for [facility] Pulmonary Rehabilitation.

## 2021-06-17 ENCOUNTER — Other Ambulatory Visit: Payer: Self-pay

## 2021-06-17 ENCOUNTER — Encounter: Payer: Medicare Other | Admitting: *Deleted

## 2021-06-17 ENCOUNTER — Encounter: Payer: Self-pay | Admitting: *Deleted

## 2021-06-17 DIAGNOSIS — I5022 Chronic systolic (congestive) heart failure: Secondary | ICD-10-CM

## 2021-06-17 NOTE — Progress Notes (Signed)
Daily Session Note  Patient Details  Name: Jose Ray MRN: [id number] Date of Birth: 1941/09/09 Referring Provider:   Flowsheet Row Pulmonary Rehab from 03/30/2021 in [facility] Cardiac and Pulmonary Rehab  Referring Provider [name]       Encounter Date: 06/17/2021  Check In:  Session Check In - 06/17/21 1127       Check-In   Supervising physician immediately available to respond to emergencies See telemetry face sheet for immediately available ER MD    Location [facility]-Cardiac & Pulmonary Rehab    Staff Present Renita Papa, RN BSN;Joseph [place], RCP,RRT,BSRT;Jessica [place], [place], RCEP, CCRP, CCET    Virtual Visit No    Medication changes reported     No    Fall or balance concerns reported    No    Warm-up and Cool-down Performed on first and last piece of equipment    Resistance Training Performed Yes    VAD Patient? No    PAD/SET Patient? No      Pain Assessment   Currently in Pain? No/denies                Social History   Tobacco Use  Smoking Status Former   Packs/day: 1.00   Years: 35.00   Pack years: 35.00   Types: Cigarettes   Quit date: 1995   Years since quitting: 27.7  Smokeless Tobacco Never    Goals Met:  Independence with exercise equipment Exercise tolerated well No report of concerns or symptoms today Strength training completed today  Goals Unmet:  Not Applicable  Comments: Pt able to follow exercise prescription today without complaint.  Will continue to monitor for progression.    Dr. Emily Filbert is Medical Director for [place].  Dr. Ottie Glazier is Medical Director for [facility] Pulmonary Rehabilitation.

## 2021-06-17 NOTE — Progress Notes (Signed)
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 (Patient not taking: Reported on 03/23/2021), Disp: , Rfl:    atorvastatin (LIPITOR) 80 MG tablet, Take 1 tablet by mouth at bedtime., Disp: , Rfl:    famotidine (PEPCID) 10 MG tablet, Take 10 mg by mouth daily., Disp: , Rfl:    fexofenadine (ALLEGRA) 180 MG tablet, Take 180 mg by mouth at bedtime., Disp: , Rfl:    fluticasone (FLONASE) 50 MCG/ACT nasal spray, Place 2 sprays into both nostrils at bedtime., Disp: , Rfl:    fluticasone-salmeterol (ADVAIR) 250-50 MCG/ACT AEPB, Advair Diskus 250 mcg-50 mcg/dose powder for inhalation (Patient not taking: Reported on 03/23/2021), Disp: , Rfl:    Fluticasone-Salmeterol (ADVAIR) 250-50 MCG/DOSE AEPB, Inhale 1 puff into the lungs 2 (two) times daily., Disp: , Rfl:    glipiZIDE (GLUCOTROL) 10 MG tablet, Take 10 mg by mouth daily before breakfast., Disp: , Rfl:    hydrochlorothiazide (HYDRODIURIL) 25 MG tablet, Take 25 mg by mouth daily., Disp: , Rfl:    hydrochlorothiazide (HYDRODIURIL) 25 MG tablet, Take 1 tablet by mouth daily. (Patient not taking: Reported on 03/23/2021), Disp: , Rfl:    icosapent Ethyl (VASCEPA) 1 g capsule, Take 2 g by mouth 2 (two) times daily., Disp: , Rfl:    isosorbide mononitrate (IMDUR) 30 MG 24 hr tablet, Take 30 mg by mouth daily., Disp: , Rfl:    losartan (COZAAR) 50 MG  tablet, Take 100 mg by mouth daily., Disp: , Rfl:    metFORMIN (GLUCOPHAGE) 1000 MG tablet, Take 1,000 mg by mouth 2 (two) times daily., Disp: , Rfl:    metoprolol succinate (TOPROL-XL) 100 MG 24 hr tablet, Take 50 mg by mouth daily. Take with or immediately following a meal. (Patient not taking: Reported on 03/23/2021), Disp: , Rfl:    metoprolol succinate (TOPROL-XL) 25 MG 24 hr tablet, Take 1 tablet by mouth daily., Disp: , Rfl:    Multiple Vitamins-Minerals (PRESERVISION AREDS 2) CAPS, Take 1 capsule by mouth at bedtime. (Patient not taking: Reported on 03/23/2021), Disp: , Rfl:    OXYGEN, Inhale 2.5-3 L into the lungs See admin instructions. Used while sleeping and physical activity, Disp: , Rfl:    Polyethyl Glycol-Propyl Glycol 0.4-0.3 % SOLN, Place 1 drop into both eyes See admin instructions. Instill 1 drop into both eyes at night, may use during the day as needed for dry eyes, Disp: , Rfl:    tiotropium (SPIRIVA) 18 MCG inhalation capsule, Place 18 mcg into inhaler and inhale daily., Disp: , Rfl:   Past Medical History: Past Medical History:  Diagnosis Date   Acute MI ([place])    CHF (congestive heart failure) (HCC)    COPD (chronic obstructive pulmonary disease) (HCC)    Coronary artery disease    Diabetes mellitus without complication (HCC)    GERD (gastroesophageal reflux disease)  History of hiatal hernia    Hypertension     Tobacco Use: Social History   Tobacco Use  Smoking Status Former   Packs/day: 1.00   Years: 35.00   Pack years: 35.00   Types: Cigarettes   Quit date: 1995   Years since quitting: 27.7  Smokeless Tobacco Never    Labs: Recent Review Scientist, physiological     Labs for ITP Cardiac and Pulmonary Rehab Latest Ref Rng & Units 10/25/2016   Hemoglobin A1c 4.8 - 5.6 % 7.0(H)        Pulmonary Assessment Scores:  Pulmonary Assessment Scores     Row Name 03/30/21 1607 06/12/21 1123       ADL UCSD   ADL Phase -- Exit    SOB Score total 60 --    Rest  1 --    Walk 3 --    Stairs 5 --    Bath 2 --    Dress 1 --    Shop 2 --         CAT Score   CAT Score 22 --         mMRC Score   mMRC Score 2 2             UCSD: Self-administered rating of dyspnea associated with activities of daily living (ADLs) 6-point scale (0 = "not at all" to 5 = "maximal or unable to do because of breathlessness")  Scoring Scores range from 0 to 120.  Minimally important difference is 5 units  CAT: CAT can identify the health impairment of COPD patients and is better correlated with disease progression.  CAT has a scoring range of zero to 40. The CAT score is classified into four groups of low (less than 10), medium (10 - 20), high (21-30) and very high (31-40) based on the impact level of disease on health status. A CAT score over 10 suggests significant symptoms.  A worsening CAT score could be explained by an exacerbation, poor medication adherence, poor inhaler technique, or progression of COPD or comorbid conditions.  CAT MCID is 2 points  mMRC: mMRC (Modified Medical Research Council) Dyspnea Scale is used to assess the degree of baseline functional disability in patients of respiratory disease due to dyspnea. No minimal important difference is established. A decrease in score of 1 point or greater is considered a positive change.   Pulmonary Function Assessment:  Pulmonary Function Assessment - 03/23/21 1343       Breath   Shortness of Breath Yes;Limiting activity             Exercise Target Goals: Exercise Program Goal: Individual exercise prescription set using results from initial 6 min walk test and THRR while considering  patient's activity barriers and safety.   Exercise Prescription Goal: Initial exercise prescription builds to 30-45 minutes a day of aerobic activity, 2-3 days per week.  Home exercise guidelines will be given to patient during program as part of exercise prescription that the participant will  acknowledge.  Education: Aerobic Exercise: - Group verbal and visual presentation on the components of exercise prescription. Introduces F.I.T.T principle from ACSM for exercise prescriptions.  Reviews F.I.T.T. principles of aerobic exercise including progression. Written material given at graduation. Flowsheet Row Pulmonary Rehab from 06/03/2021 in [facility] Cardiac and Pulmonary Rehab  Date 06/03/21  Educator [facility]  Instruction Review Code 1- Verbalizes Understanding       Education: Resistance Exercise: - Group verbal and visual presentation on the components of exercise  prescription. Introduces F.I.T.T principle from ACSM for exercise prescriptions  Reviews F.I.T.T. principles of resistance exercise including progression. Written material given at graduation. Flowsheet Row Pulmonary Rehab from 06/03/2021 in [facility] Cardiac and Pulmonary Rehab  Date 04/08/21  Educator [facility]  Instruction Review Code 1- Verbalizes Understanding        Education: Exercise & Equipment Safety: - Individual verbal instruction and demonstration of equipment use and safety with use of the equipment. Flowsheet Row Pulmonary Rehab from 06/03/2021 in [facility] Cardiac and Pulmonary Rehab  Date 03/30/21  Educator AS  Instruction Review Code 1- Verbalizes Understanding       Education: Exercise Physiology & General Exercise Guidelines: - Group verbal and written instruction with models to review the exercise physiology of the cardiovascular system and associated critical values. Provides general exercise guidelines with specific guidelines to those with heart or lung disease.  Flowsheet Row Pulmonary Rehab from 06/03/2021 in [facility] Cardiac and Pulmonary Rehab  Date 05/27/21  Educator AS  Instruction Review Code 1- Verbalizes Understanding       Education: Flexibility, Balance, Mind/Body Relaxation: - Group verbal and visual presentation with interactive activity on the components of exercise prescription. Introduces F.I.T.T  principle from ACSM for exercise prescriptions. Reviews F.I.T.T. principles of flexibility and balance exercise training including progression. Also discusses the mind body connection.  Reviews various relaxation techniques to help reduce and manage stress (i.e. Deep breathing, progressive muscle relaxation, and visualization). Balance handout provided to take home. Written material given at graduation. Flowsheet Row Pulmonary Rehab from 06/03/2021 in [facility] Cardiac and Pulmonary Rehab  Date 04/15/21  Educator [facility]  Instruction Review Code 1- Verbalizes Understanding       Activity Barriers & Risk Stratification:   6 Minute Walk:  6 Minute Walk     Row Name 03/30/21 1554 06/12/21 1121       6 Minute Walk   Phase Initial Discharge    Distance 925 feet 1120 feet    Distance % Change -- 21.1 %    Distance Feet Change -- 195 ft    Walk Time 6 minutes 6 minutes    # of Rest Breaks 0 0    MPH 1.75 2.12    METS 2 2.21    RPE 17 15    Perceived Dyspnea  3 3    VO2 Peak 7 7.75    Symptoms No Yes (comment)    Comments -- SOB    Resting HR 95 bpm 85 bpm    Resting BP 124/54 128/64    Resting Oxygen Saturation  88 % 95 %    Exercise Oxygen Saturation  during 6 min walk 84 % 82 %    Max Ex. HR 108 bpm 110 bpm    Max Ex. BP 160/64 148/74    2 Minute Post BP 126/60 130/70         Interval HR   1 Minute HR 98 102    2 Minute HR 97 105    3 Minute HR 98 104    4 Minute HR 98 106    5 Minute HR 105 110    6 Minute HR 108 108    2 Minute Post HR 98 92    Interval Heart Rate? Yes Yes         Interval Oxygen   Interval Oxygen? Yes Yes    Baseline Oxygen Saturation % 88 % 95 %    1 Minute Oxygen Saturation % 87 % 84 %  1 Minute Liters of Oxygen 3 L 3 L  pulsed concentrator    2 Minute Oxygen Saturation % -- 83 %    2 Minute Liters of Oxygen 3 L 0 L    3 Minute Oxygen Saturation % 88 % 83 %    3 Minute Liters of Oxygen 3 L 0 L    4 Minute Oxygen Saturation % 85 % 84 %    4 Minute  Liters of Oxygen 3 L 0 L    5 Minute Oxygen Saturation % 85 % 83 %    5 Minute Liters of Oxygen 3 L 0 L    6 Minute Oxygen Saturation % 84 % 82 %    6 Minute Liters of Oxygen 3 L 0 L    2 Minute Post Oxygen Saturation % -- 90 %    2 Minute Post Liters of Oxygen 3 L 0 L            Oxygen Initial Assessment:  Oxygen Initial Assessment - 03/23/21 1341       Home Oxygen   Home Oxygen Device Home Concentrator;Portable Concentrator;E-Tanks    Sleep Oxygen Prescription Continuous    Liters per minute 3    Home Exercise Oxygen Prescription Continuous    Liters per minute 3    Home Resting Oxygen Prescription None    Liters per minute 0    Compliance with Home Oxygen Use Yes      Initial 6 min Walk   Oxygen Used Continuous    Liters per minute 3      Program Oxygen Prescription   Program Oxygen Prescription Continuous    Liters per minute 3      Intervention   Short Term Goals To learn and exhibit compliance with exercise, home and travel O2 prescription;To learn and understand importance of monitoring SPO2 with pulse oximeter and demonstrate accurate use of the pulse oximeter.;To learn and understand importance of maintaining oxygen saturations>88%;To learn and demonstrate proper pursed lip breathing techniques or other breathing techniques. ;To learn and demonstrate proper use of respiratory medications    Long  Term Goals Exhibits compliance with exercise, home  and travel O2 prescription;Verbalizes importance of monitoring SPO2 with pulse oximeter and return demonstration;Maintenance of O2 saturations>88%;Exhibits proper breathing techniques, such as pursed lip breathing or other method taught during program session;Compliance with respiratory medication;Demonstrates proper use of MDI's             Oxygen Re-Evaluation:  Oxygen Re-Evaluation     Row Name 04/01/21 1104 06/03/21 1111           Program Oxygen Prescription   Program Oxygen Prescription Continuous  Continuous      Liters per minute 3 3             Home Oxygen   Home Oxygen Device Home Concentrator;Portable Concentrator;E-Tanks Home Concentrator;Portable Concentrator;E-Tanks      Sleep Oxygen Prescription Continuous Continuous      Liters per minute 3 3      Home Exercise Oxygen Prescription Continuous Continuous      Liters per minute 3 3      Home Resting Oxygen Prescription None None      Liters per minute 0 0  prn for rest      Compliance with Home Oxygen Use Yes Yes             Goals/Expected Outcomes   Short Term Goals To learn and exhibit compliance with exercise, home and  travel O2 prescription;To learn and understand importance of monitoring SPO2 with pulse oximeter and demonstrate accurate use of the pulse oximeter.;To learn and understand importance of maintaining oxygen saturations>88%;To learn and demonstrate proper pursed lip breathing techniques or other breathing techniques.  To learn and demonstrate proper pursed lip breathing techniques or other breathing techniques.       Long  Term Goals Exhibits compliance with exercise, home  and travel O2 prescription;Verbalizes importance of monitoring SPO2 with pulse oximeter and return demonstration;Maintenance of O2 saturations>88%;Exhibits proper breathing techniques, such as pursed lip breathing or other method taught during program session Exhibits proper breathing techniques, such as pursed lip breathing or other method taught during program session      Comments Reviewed PLB technique with pt.  Talked about how it works and it's importance in maintaining their exercise saturations. Diaphragmatic and PLB breathing explained and performed with patient. Patient has a better understanding of how to do these exercises to help with breathing performance and relaxation. Patient performed breathing techniques adequately and to practice further at home.      Goals/Expected Outcomes Short: Become more profiecient at using PLB.   Long:  Become independent at using PLB. Short: practice PLB and diaphragmatic breathing at home. Long: Use PLB and diaphragmatic breathing independently post [employer].               Oxygen Discharge (Final Oxygen Re-Evaluation):  Oxygen Re-Evaluation - 06/03/21 1111       Program Oxygen Prescription   Program Oxygen Prescription Continuous    Liters per minute 3      Home Oxygen   Home Oxygen Device Home Concentrator;Portable Concentrator;E-Tanks    Sleep Oxygen Prescription Continuous    Liters per minute 3    Home Exercise Oxygen Prescription Continuous    Liters per minute 3    Home Resting Oxygen Prescription None    Liters per minute 0   prn for rest   Compliance with Home Oxygen Use Yes      Goals/Expected Outcomes   Short Term Goals To learn and demonstrate proper pursed lip breathing techniques or other breathing techniques.     Long  Term Goals Exhibits proper breathing techniques, such as pursed lip breathing or other method taught during program session    Comments Diaphragmatic and PLB breathing explained and performed with patient. Patient has a better understanding of how to do these exercises to help with breathing performance and relaxation. Patient performed breathing techniques adequately and to practice further at home.    Goals/Expected Outcomes Short: practice PLB and diaphragmatic breathing at home. Long: Use PLB and diaphragmatic breathing independently post [employer].             Initial Exercise Prescription:  Initial Exercise Prescription - 03/30/21 1500       Date of Initial Exercise RX and Referring Provider   Date 03/30/21    Referring Provider [name]      Oxygen   Oxygen Continuous    Liters 3      Treadmill   MPH 1.3    Grade 0    Minutes 15    METs 2      Recumbant Bike   Level 1    RPM 60    Minutes 15    METs 2      NuStep   Level 1    SPM 80    Minutes 15    METs 2      REL-XR   Level  1    Speed 50    Minutes 15     METs 2      T5 Nustep   Level 1    SPM 50    Minutes 15    METs 2      Prescription Details   Frequency (times per week) 3    Duration Progress to 30 minutes of continuous aerobic without signs/symptoms of physical distress      Intensity   THRR 40-80% of Max Heartrate 113-132    Ratings of Perceived Exertion 11-13    Perceived Dyspnea 0-4      Progression   Progression Continue to progress workloads to maintain intensity without signs/symptoms of physical distress.      Resistance Training   Training Prescription Yes    Weight 3 lb    Reps 10-15             Perform Capillary Blood Glucose checks as needed.  Exercise Prescription Changes:   Exercise Prescription Changes     Row Name 04/16/21 0900 04/29/21 1500 05/04/21 1100 05/13/21 1100 05/28/21 0800     Response to Exercise   Blood Pressure (Admit) 132/68 122/64 -- 116/62 --   Blood Pressure (Exercise) 128/64 -- -- 152/64 --   Blood Pressure (Exit) 102/60 112/60 -- 114/60 102/62   Heart Rate (Admit) 75 bpm 88 bpm -- 80 bpm 88 bpm   Heart Rate (Exercise) 98 bpm 95 bpm -- 103 bpm 92 bpm   Heart Rate (Exit) 93 bpm 88 bpm -- 93 bpm 89 bpm   Oxygen Saturation (Admit) 92 % 97 % -- 90 % 92 %   Oxygen Saturation (Exercise) 90 % 88 % -- 89 % 90 %   Oxygen Saturation (Exit) 93 % 90 % -- 90 % 91 %   Rating of Perceived Exertion (Exercise) 13 12 -- 13 14   Perceived Dyspnea (Exercise) 1 2 -- 1 3   Symptoms none SOB -- -- SOB   Comments first full week of exercise -- -- -- --   Duration Progress to 30 minutes of  aerobic without signs/symptoms of physical distress Continue with 30 min of aerobic exercise without signs/symptoms of physical distress. -- Continue with 30 min of aerobic exercise without signs/symptoms of physical distress. Continue with 30 min of aerobic exercise without signs/symptoms of physical distress.   Intensity THRR unchanged THRR unchanged -- THRR unchanged THRR unchanged     Progression    Progression Continue to progress workloads to maintain intensity without signs/symptoms of physical distress. Continue to progress workloads to maintain intensity without signs/symptoms of physical distress. -- Continue to progress workloads to maintain intensity without signs/symptoms of physical distress. Continue to progress workloads to maintain intensity without signs/symptoms of physical distress.   Average METs 2.06 2.38 -- 2 2.45     Resistance Training   Training Prescription Yes Yes -- Yes Yes   Weight 5 lb 3 lb -- 3 lb 3 lb   Reps 10-15 10-15 -- 10-15 10-15     Interval Training   Interval Training -- No -- No No     Oxygen   Oxygen Continuous Continuous -- Continuous Continuous   Liters 3 3 -- 3 3     Treadmill   MPH 1.3 1.3 -- -- 1.6   Grade 0 0 -- -- 0   Minutes 15 15 -- -- 15   METs 2 2 -- -- 2.23     Recumbant Bike   Level  1 1 -- 1 2.6   Minutes 18 18 -- 15 15   METs 2.82 2.63 -- -- 2.81     REL-XR   Level -- 2 -- -- 2   Minutes -- 15 -- -- 15   METs -- 3 -- -- 2.23     T5 Nustep   Level 3 2 -- 3 3   Minutes 15 15 -- 15 15   METs 1.9 1.9 -- 2 1.9     Home Exercise Plan   Plans to continue exercise at -- -- Home (comment)  Walking, wants to join [employer] (comment)  Walking, wants to join [employer] (comment)  Walking, wants to join [employer] -- -- Add 2 additional days to program exercise sessions.  Start with 1 day Add 2 additional days to program exercise sessions.  Start with 1 day Add 2 additional days to program exercise sessions.  Start with 1 day   Initial Home Exercises Provided -- -- 05/04/21 05/04/21 05/04/21    Row Name 06/09/21 1300             Response to Exercise   Blood Pressure (Admit) 116/62       Blood Pressure (Exit) 110/58       Heart Rate (Admit) 84 bpm       Heart Rate (Exercise) 87 bpm       Heart Rate (Exit) 82 bpm       Oxygen Saturation (Admit) 93 %       Oxygen Saturation (Exercise) 90 %       Oxygen  Saturation (Exit) 93 %       Rating of Perceived Exertion (Exercise) 13       Perceived Dyspnea (Exercise) 3       Duration Continue with 30 min of aerobic exercise without signs/symptoms of physical distress.       Intensity THRR unchanged               Progression   Progression Continue to progress workloads to maintain intensity without signs/symptoms of physical distress.       Average METs 2.8               Resistance Training   Training Prescription Yes       Weight 3 lb       Reps 10-15               Interval Training   Interval Training No               Oxygen   Oxygen Continuous       Liters 3               Treadmill   MPH 1.6       Grade 0       Minutes 15       METs 2.23               REL-XR   Level 2       Minutes 15       METs 3.3               Home Exercise Plan   Plans to continue exercise at Home (comment)  Walking, wants to join [employer] Add 2 additional days to program exercise sessions.  Start with 1 day       Initial Home Exercises Provided 05/04/21  Exercise Comments:   Exercise Comments     Row Name 04/01/21 1103           Exercise Comments First full day of exercise!  Patient was oriented to gym and equipment including functions, settings, policies, and procedures.  Patient's individual exercise prescription and treatment plan were reviewed.  All starting workloads were established based on the results of the 6 minute walk test done at initial orientation visit.  The plan for exercise progression was also introduced and progression will be customized based on patient's performance and goals.                Exercise Goals and Review:   Exercise Goals     Row Name 03/30/21 1617             Exercise Goals   Increase Physical Activity Yes       Intervention Provide advice, education, support and counseling about physical activity/exercise needs.;Develop an individualized exercise  prescription for aerobic and resistive training based on initial evaluation findings, risk stratification, comorbidities and participant's personal goals.       Expected Outcomes Short Term: Attend rehab on a regular basis to increase amount of physical activity.;Long Term: Add in home exercise to make exercise part of routine and to increase amount of physical activity.;Long Term: Exercising regularly at least 3-5 days a week.       Increase Strength and Stamina Yes       Intervention Provide advice, education, support and counseling about physical activity/exercise needs.;Develop an individualized exercise prescription for aerobic and resistive training based on initial evaluation findings, risk stratification, comorbidities and participant's personal goals.       Expected Outcomes Short Term: Increase workloads from initial exercise prescription for resistance, speed, and METs.;Short Term: Perform resistance training exercises routinely during rehab and add in resistance training at home;Long Term: Improve cardiorespiratory fitness, muscular endurance and strength as measured by increased METs and functional capacity (6MWT)       Able to understand and use rate of perceived exertion (RPE) scale Yes       Intervention Provide education and explanation on how to use RPE scale       Expected Outcomes Short Term: Able to use RPE daily in rehab to express subjective intensity level;Long Term:  Able to use RPE to guide intensity level when exercising independently       Able to understand and use Dyspnea scale Yes       Intervention Provide education and explanation on how to use Dyspnea scale       Expected Outcomes Short Term: Able to use Dyspnea scale daily in rehab to express subjective sense of shortness of breath during exertion;Long Term: Able to use Dyspnea scale to guide intensity level when exercising independently       Knowledge and understanding of Target Heart Rate Range (THRR) Yes        Intervention Provide education and explanation of THRR including how the numbers were predicted and where they are located for reference       Expected Outcomes Short Term: Able to state/look up THRR;Short Term: Able to use daily as guideline for intensity in rehab;Long Term: Able to use THRR to govern intensity when exercising independently       Able to check pulse independently Yes       Intervention Provide education and demonstration on how to check pulse in carotid and radial arteries.;Review the importance of being able to check your  own pulse for safety during independent exercise       Expected Outcomes Short Term: Able to explain why pulse checking is important during independent exercise;Long Term: Able to check pulse independently and accurately       Understanding of Exercise Prescription Yes       Intervention Provide education, explanation, and written materials on patient's individual exercise prescription       Expected Outcomes Short Term: Able to explain program exercise prescription;Long Term: Able to explain home exercise prescription to exercise independently                Exercise Goals Re-Evaluation :  Exercise Goals Re-Evaluation     Row Name 04/01/21 1104 04/16/21 0919 04/29/21 1538 05/04/21 1147 05/13/21 1117     Exercise Goal Re-Evaluation   Exercise Goals Review Increase Physical Activity;Able to understand and use rate of perceived exertion (RPE) scale;Knowledge and understanding of Target Heart Rate Range (THRR);Understanding of Exercise Prescription;Increase Strength and Stamina;Able to understand and use Dyspnea scale;Able to check pulse independently Increase Physical Activity;Increase Strength and Stamina;Understanding of Exercise Prescription Increase Physical Activity;Increase Strength and Stamina;Understanding of Exercise Prescription Increase Physical Activity;Increase Strength and Stamina;Understanding of Exercise Prescription Increase Physical  Activity;Increase Strength and Stamina   Comments Reviewed RPE and dyspnea scales, THR and program prescription with pt today.  Pt voiced understanding and was given a copy of goals to take home. Karthik is off to a good start as last week was his first full week of exercise. He has already increased to level 3 on the T5 Nustep and 5 lbs for hand weights. Will continue to monitor progress. Linna Hoff is doing well in rehab.  He is up to 18 watts on the bike.  We will continue to monitor his progress. Reviewed home exercise with pt today.  Pt plans to walk for exercise. Patient is thinking about joining the [employer] and was provided a brochure. He has a recumbant bike at home that he may try to make work again as it hasn't worked for a while.  Reviewed THR, pulse, RPE, sign and symptoms, pulse oximetery and when to call 911 or MD.  Also discussed weather considerations and indoor options.  Pt voiced understanding. Linna Hoff is doing well.  His oxygen stays above 88% during exercise sessions.  He has increased level on T5.  Staff wil encourage increasing weight for strength work   Expected Outcomes Short: Use RPE daily to regulate intensity. Long: Follow program prescription in THR. Short: Continue to build up speed on TM Long: Continue to increase overall strength and stamina Short: Move back up to level 3 on the T5  Long: Continue to improve stamina Short: Add on 1 day of exercise at home: Continue to exercise independently at home at appropriate prescription Short: maintain consistent attendance Long: continue to build stamina    Row Name 05/28/21 0842 06/09/21 1355           Exercise Goal Re-Evaluation   Exercise Goals Review Increase Physical Activity;Increase Strength and Stamina Increase Physical Activity;Increase Strength and Stamina      Comments Sione is doing well in rehab. He did increase his treadmill speed to 1.6. Unfortunately, he is not quite hitting his [place] during exercise and will encourage to meet  that standard as best as possible. Will continue to monitor. Dan continue sto do well. he attends consistently and oxygen stays in the 90s.  Staff will encourage him to try 4 lb for strength work.  Expected Outcomes Short: Continue to increase loads on machines Long: Continue to increase overall MET level Short: try 4 ln Long:  build overall stamina               Discharge Exercise Prescription (Final Exercise Prescription Changes):  Exercise Prescription Changes - 06/09/21 1300       Response to Exercise   Blood Pressure (Admit) 116/62    Blood Pressure (Exit) 110/58    Heart Rate (Admit) 84 bpm    Heart Rate (Exercise) 87 bpm    Heart Rate (Exit) 82 bpm    Oxygen Saturation (Admit) 93 %    Oxygen Saturation (Exercise) 90 %    Oxygen Saturation (Exit) 93 %    Rating of Perceived Exertion (Exercise) 13    Perceived Dyspnea (Exercise) 3    Duration Continue with 30 min of aerobic exercise without signs/symptoms of physical distress.    Intensity THRR unchanged      Progression   Progression Continue to progress workloads to maintain intensity without signs/symptoms of physical distress.    Average METs 2.8      Resistance Training   Training Prescription Yes    Weight 3 lb    Reps 10-15      Interval Training   Interval Training No      Oxygen   Oxygen Continuous    Liters 3      Treadmill   MPH 1.6    Grade 0    Minutes 15    METs 2.23      REL-XR   Level 2    Minutes 15    METs 3.3      Home Exercise Plan   Plans to continue exercise at Home (comment)   Walking, wants to join [employer] Add 2 additional days to program exercise sessions.   Start with 1 day   Initial Home Exercises Provided 05/04/21             Nutrition:  Target Goals: Understanding of nutrition guidelines, daily intake of sodium <1580m, cholesterol <2042m calories 30% from fat and 7% or less from saturated fats, daily to have 5 or more servings of fruits and  vegetables.  Education: All About Nutrition: -Group instruction provided by verbal, written material, interactive activities, discussions, models, and posters to present general guidelines for heart healthy nutrition including fat, fiber, MyPlate, the role of sodium in heart healthy nutrition, utilization of the nutrition label, and utilization of this knowledge for meal planning. Follow up email sent as well. Written material given at graduation. Flow[facility]  Date 04/22/21  Educator [facility]CN[facility] Medical GroupInstruction Review Code 1- Verbalizes Understanding       Biometrics:  Pre Biometrics - 03/31/21 1009       Pre Biometrics   Height 5' 11.5" (1.816 m)    Weight 213 lb 8 oz (96.8 kg)    BMI (Calculated) 29.37    Single Leg Stand 9.8 seconds             Post Biometrics - 06/12/21 1123        Post  Biometrics   Height 5' 11.5" (1.816 m)    Weight 220 lb 6.4 oz (100 kg)    BMI (Calculated) 30.31    Single Leg Stand 2.4 seconds             Nutrition Therapy Plan and Nutrition Goals:  Nutrition Therapy &  Goals - 04/08/21 1248       Nutrition Therapy   Diet Heart healthy, low Na, diabetes friendly    Drug/Food Interactions Statins/Certain Fruits    Protein (specify units) 110-115g    Fiber 30 grams    Whole Grain Foods 3 servings    Saturated Fats 12 max. grams    Fruits and Vegetables 8 servings/day    Sodium 1.5 grams      Personal Nutrition Goals   Nutrition Goal ST: add snacks in between breakfast and dinner with protein and fat like fruit with nuts or bean salad LT: meet protein needs, eat consistent carbohydrates    Comments B: Toast (whole wheat) (2 slices with low fat cream cheese) S: round of cheese D: varies: vegetables and meat S: ice cream bar or sherbert. They just got a new monitor to see if it will work. A1C: a bit over 7 the last time he was here. They almost never eat fried food, they go out to  eat maybe once per week. He will coook and he will does not typically use oil, but will put hamburger in baking dish and [place] dressing. He will also use pam to grease pan. He also does not cook with any salt. He will use truvia and less sugar when recipe calls for sugar. Discussed heart healthy eating and diabetes friendly eating.      Intervention Plan   Intervention Prescribe, educate and counsel regarding individualized specific dietary modifications aiming towards targeted core components such as weight, hypertension, lipid management, diabetes, heart failure and other comorbidities.;Nutrition handout(s) given to patient.    Expected Outcomes Short Term Goal: Understand basic principles of dietary content, such as calories, fat, sodium, cholesterol and nutrients.;Short Term Goal: A plan has been developed with personal nutrition goals set during dietitian appointment.;Long Term Goal: Adherence to prescribed nutrition plan.             Nutrition Assessments:  MEDIFICTS Score Key: ?70 Need to make dietary changes  40-70 Heart Healthy Diet ? 40 Therapeutic Level Cholesterol Diet  Flowsheet Row Pulmonary Rehab from 03/30/2021 in [facility] Cardiac and Pulmonary Rehab  Picture Your Plate Total Score on Admission 59      Picture Your Plate Scores: <21 Unhealthy dietary pattern with much room for improvement. 41-50 Dietary pattern unlikely to meet recommendations for good health and room for improvement. 51-60 More healthful dietary pattern, with some room for improvement.  >60 Healthy dietary pattern, although there may be some specific behaviors that could be improved.   Nutrition Goals Re-Evaluation:  Nutrition Goals Re-Evaluation     [place] Name 06/03/21 1114             Goals   Nutrition Goal Eat a lower fat diet       Comment Linna Hoff states that he eats a lower fat diet and has 2 vegetables in every meal except breakfast. He would like to drop some weight.       Expected Outcome  Short: lost some weight. Long: reach a weight goal of less than 200.                Nutrition Goals Discharge (Final Nutrition Goals Re-Evaluation):  Nutrition Goals Re-Evaluation - 06/03/21 1114       Goals   Nutrition Goal Eat a lower fat diet    Comment Linna Hoff states that he eats a lower fat diet and has 2 vegetables in every meal except breakfast. He would like to drop some  weight.    Expected Outcome Short: lost some weight. Long: reach a weight goal of less than 200.             Psychosocial: Target Goals: Acknowledge presence or absence of significant depression and/or stress, maximize coping skills, provide positive support system. Participant is able to verbalize types and ability to use techniques and skills needed for reducing stress and depression.   Education: Stress, Anxiety, and Depression - Group verbal and visual presentation to define topics covered.  Reviews how body is impacted by stress, anxiety, and depression.  Also discusses healthy ways to reduce stress and to treat/manage anxiety and depression.  Written material given at graduation. Flowsheet Row Pulmonary Rehab from 06/03/2021 in [facility] Cardiac and Pulmonary Rehab  Date 05/20/21  Educator [employer]  Instruction Review Code 1- Verbalizes Understanding       Education: Sleep Hygiene -Provides group verbal and written instruction about how sleep can affect your health.  Define sleep hygiene, discuss sleep cycles and impact of sleep habits. Review good sleep hygiene tips.    Initial Review & Psychosocial Screening:  Initial Psych Review & Screening - 03/23/21 1346       Initial Review   Current issues with None Identified      Family Dynamics   Good Support System? Yes    Comments Calyx can look to his daughter that lives nearby and his wife for support. He states that his mental health is great.      Barriers   Psychosocial barriers to participate in program The patient should benefit from training in  stress management and relaxation.;There are no identifiable barriers or psychosocial needs.      Screening Interventions   Interventions To provide support and resources with identified psychosocial needs;Provide feedback about the scores to participant;Encouraged to exercise    Expected Outcomes Short Term goal: Utilizing psychosocial counselor, staff and physician to assist with identification of specific Stressors or current issues interfering with healing process. Setting desired goal for each stressor or current issue identified.;Long Term Goal: Stressors or current issues are controlled or eliminated.;Short Term goal: Identification and review with participant of any Quality of Life or Depression concerns found by scoring the questionnaire.;Long Term goal: The participant improves quality of Life and PHQ9 Scores as seen by post scores and/or verbalization of changes             Quality of Life Scores:  Quality of Life - 03/30/21 1612       Quality of Life   Select Quality of Life      Quality of Life Scores   Health/Function Pre 21.07 %    Socioeconomic Pre 22.71 %    Psych/Spiritual Pre 20.86 %    Family Pre 24.6 %    GLOBAL Pre 21.88 %            Scores of 19 and below usually indicate a poorer quality of life in these areas.  A difference of  2-3 points is a clinically meaningful difference.  A difference of 2-3 points in the total score of the Quality of Life Index has been associated with significant improvement in overall quality of life, self-image, physical symptoms, and general health in studies assessing change in quality of life.  PHQ-9: Recent Review Flowsheet Data     Depression screen [facility] 2/9 04/29/2021 03/30/2021   Decreased Interest 0 0   Down, Depressed, Hopeless 0 0   PHQ - 2 Score 0 0   Altered  sleeping 1 2    Tired, decreased energy 0 3   Change in appetite 0 1   Feeling bad or failure about yourself  0 0   Trouble concentrating 3 2   Moving slowly  or fidgety/restless 0 2   Suicidal thoughts 0 0   PHQ-9 Score 4 10   Difficult doing work/chores Not difficult at all Somewhat difficult      Interpretation of Total Score  Total Score Depression Severity:  1-4 = Minimal depression, 5-9 = Mild depression, 10-14 = Moderate depression, 15-19 = Moderately severe depression, 20-27 = Severe depression   Psychosocial Evaluation and Intervention:   Psychosocial Re-Evaluation:  Psychosocial Re-Evaluation     [place] Name 03/23/21 1347 04/29/21 1118 05/04/21 1130 06/03/21 1113       Psychosocial Re-Evaluation   Current issues with None Identified None Identified None Identified None Identified    Comments Maximillion can look to his daughter that lives nearby and his wife for support. He states that his mental health is great. Reviewed patient health questionnaire (PHQ-9) with patient for follow up. Previously, patients score indicated signs/symptoms of depression.  Reviewed to see if patient is improving symptom wise while in program.  Score improved and patient states that it is because he has been able to exercise and have a little more energy. Keelan is doing very well mentally. He denies any problems, concerns, or issues. Sleep is good. He has good support from his wife- He does not take any medications for depression or anxiety. His breathing is better  as he feels like activites at home are easier to do than before starting the program. He is pleased thus far. Patient reports no issues with their current mental states, sleep, stress, depression or anxiety. Will follow up with patient in a few weeks for any changes.    Expected Outcomes Short: Exercise regularly to support mental health and notify staff of any changes. Long: maintain mental health and well being through teaching of rehab or prescribed medications independently. Short: Continue to attend [facility] regularly for regular exercise and social engagement. Long: Continue to improve symptoms and  manage a positive mental state. Short: Continue attendance with rehab Long: Continue to maintain positive attitude and exercise for stress management Short: Continue to exercise regularly to support mental health and notify staff of any changes. Long: maintain mental health and well being through teaching of rehab or prescribed medications independently.    Interventions Encouraged to attend Pulmonary Rehabilitation for the exercise Encouraged to attend Pulmonary Rehabilitation for the exercise Encouraged to attend Pulmonary Rehabilitation for the exercise Encouraged to attend Pulmonary Rehabilitation for the exercise    Continue Psychosocial Services  Follow up required by staff Follow up required by staff -- Follow up required by staff             Psychosocial Discharge (Final Psychosocial Re-Evaluation):  Psychosocial Re-Evaluation - 06/03/21 1113       Psychosocial Re-Evaluation   Current issues with None Identified    Comments Patient reports no issues with their current mental states, sleep, stress, depression or anxiety. Will follow up with patient in a few weeks for any changes.    Expected Outcomes Short: Continue to exercise regularly to support mental health and notify staff of any changes. Long: maintain mental health and well being through teaching of rehab or prescribed medications independently.    Interventions Encouraged to attend Pulmonary Rehabilitation for the exercise    Continue Psychosocial Services  Follow up  required by staff             Education: Education Goals: Education classes will be provided on a weekly basis, covering required topics. Participant will state understanding/return demonstration of topics presented.  Learning Barriers/Preferences:  Learning Barriers/Preferences - 03/23/21 1344       Learning Barriers/Preferences   Learning Barriers None    Learning Preferences None             General Pulmonary Education Topics:  Infection  Prevention: - Provides verbal and written material to individual with discussion of infection control including proper hand washing and proper equipment cleaning during exercise session. Flowsheet Row Pulmonary Rehab from 06/03/2021 in [facility] Cardiac and Pulmonary Rehab  Date 03/30/21  Educator AS  Instruction Review Code 1- Verbalizes Understanding       Falls Prevention: - Provides verbal and written material to individual with discussion of falls prevention and safety. Flowsheet Row Pulmonary Rehab from 06/03/2021 in [facility] Cardiac and Pulmonary Rehab  Date 03/30/21  Educator AS  Instruction Review Code 1- Verbalizes Understanding       Chronic Lung Disease Review: - Group verbal instruction with posters, models, PowerPoint presentations and videos,  to review new updates, new respiratory medications, new advancements in procedures and treatments. Providing information on websites and "800" numbers for continued self-education. Includes information about supplement oxygen, available portable oxygen systems, continuous and intermittent flow rates, oxygen safety, concentrators, and Medicare reimbursement for oxygen. Explanation of Pulmonary Drugs, including class, frequency, complications, importance of spacers, rinsing mouth after steroid MDI's, and proper cleaning methods for nebulizers. Review of basic lung anatomy and physiology related to function, structure, and complications of lung disease. Review of risk factors. Discussion about methods for diagnosing sleep apnea and types of masks and machines for OSA. Includes a review of the use of types of environmental controls: home humidity, furnaces, filters, dust mite/pet prevention, HEPA vacuums. Discussion about weather changes, air quality and the benefits of nasal washing. Instruction on Warning signs, infection symptoms, calling MD promptly, preventive modes, and value of vaccinations. Review of effective airway clearance, coughing and/or  vibration techniques. Emphasizing that all should Create an Action Plan. Written material given at graduation.   AED/CPR: - Group verbal and written instruction with the use of models to demonstrate the basic use of the AED with the basic ABC's of resuscitation.    Anatomy and Cardiac Procedures: - Group verbal and visual presentation and models provide information about basic cardiac anatomy and function. Reviews the testing methods done to diagnose heart disease and the outcomes of the test results. Describes the treatment choices: Medical Management, Angioplasty, or Coronary Bypass Surgery for treating various heart conditions including Myocardial Infarction, Angina, Valve Disease, and Cardiac Arrhythmias.  Written material given at graduation. Flowsheet Row Pulmonary Rehab from 06/03/2021 in [facility] Cardiac and Pulmonary Rehab  Date 04/08/21  Educator SB  Instruction Review Code 1- Verbalizes Understanding       Medication Safety: - Group verbal and visual instruction to review commonly prescribed medications for heart and lung disease. Reviews the medication, class of the drug, and side effects. Includes the steps to properly store meds and maintain the prescription regimen.  Written material given at graduation. Flowsheet Row Pulmonary Rehab from 06/03/2021 in [facility] Cardiac and Pulmonary Rehab  Date 04/29/21  Educator SB  Instruction Review Code 1- Verbalizes Understanding       Other: -Provides group and verbal instruction on various topics (see comments)   Knowledge Questionnaire Score:  Knowledge Questionnaire Score - 03/30/21 1606       Knowledge Questionnaire Score   Pre Score 21/26 cardiac  14/18 pulmonary              Core Components/Risk Factors/Patient Goals at Admission:  Personal Goals and Risk Factors at Admission - 03/30/21 1614       Core Components/Risk Factors/Patient Goals on Admission    Weight Management Yes;Weight Maintenance;Weight Loss     Intervention Weight Management: Develop a combined nutrition and exercise program designed to reach desired caloric intake, while maintaining appropriate intake of nutrient and fiber, sodium and fats, and appropriate energy expenditure required for the weight goal.;Weight Management: Provide education and appropriate resources to help participant work on and attain dietary goals.;Weight Management/Obesity: Establish reasonable short term and long term weight goals.    Expected Outcomes Short Term: Continue to assess and modify interventions until short term weight is achieved;Long Term: Adherence to nutrition and physical activity/exercise program aimed toward attainment of established weight goal;Understanding recommendations for meals to include 15-35% energy as protein, 25-35% energy from fat, 35-60% energy from carbohydrates, less than 252m of dietary cholesterol, 20-35 gm of total fiber daily;Understanding of distribution of calorie intake throughout the day with the consumption of 4-5 meals/snacks;Weight Loss: Understanding of general recommendations for a balanced deficit meal plan, which promotes 1-2 lb weight loss per week and includes a negative energy balance of [phone] kcal/d    Improve shortness of breath with ADL's Yes    Intervention Provide education, individualized exercise plan and daily activity instruction to help decrease symptoms of SOB with activities of daily living.    Expected Outcomes Short Term: Improve cardiorespiratory fitness to achieve a reduction of symptoms when performing ADLs;Long Term: Be able to perform more ADLs without symptoms or delay the onset of symptoms    Diabetes Yes    Intervention Provide education about signs/symptoms and action to take for hypo/hyperglycemia.;Provide education about proper nutrition, including hydration, and aerobic/resistive exercise prescription along with prescribed medications to achieve blood glucose in normal ranges: Fasting glucose  65-99 mg/dL    Expected Outcomes Short Term: Participant verbalizes understanding of the signs/symptoms and immediate care of hyper/hypoglycemia, proper foot care and importance of medication, aerobic/resistive exercise and nutrition plan for blood glucose control.;Long Term: Attainment of HbA1C < 7%.    Heart Failure Yes    Intervention Provide a combined exercise and nutrition program that is supplemented with education, support and counseling about heart failure. Directed toward relieving symptoms such as shortness of breath, decreased exercise tolerance, and extremity edema.    Expected Outcomes Improve functional capacity of life;Short term: Attendance in program 2-3 days a week with increased exercise capacity. Reported lower sodium intake. Reported increased fruit and vegetable intake. Reports medication compliance.;Short term: Daily weights obtained and reported for increase. Utilizing diuretic protocols set by physician.;Long term: Adoption of self-care skills and reduction of barriers for early signs and symptoms recognition and intervention leading to self-care maintenance.    Hypertension Yes    Intervention Provide education on lifestyle modifcations including regular physical activity/exercise, weight management, moderate sodium restriction and increased consumption of fresh fruit, vegetables, and low fat dairy, alcohol moderation, and smoking cessation.;Monitor prescription use compliance.    Expected Outcomes Short Term: Continued assessment and intervention until BP is < 140/931mHG in hypertensive participants. < 130/8036mG in hypertensive participants with diabetes, heart failure or chronic kidney disease.;Long Term: Maintenance of blood pressure at goal levels.    Lipids Yes  Intervention Provide education and support for participant on nutrition & aerobic/resistive exercise along with prescribed medications to achieve LDL <74m, HDL >483m    Expected Outcomes Short Term:  Participant states understanding of desired cholesterol values and is compliant with medications prescribed. Participant is following exercise prescription and nutrition guidelines.;Long Term: Cholesterol controlled with medications as prescribed, with individualized exercise RX and with personalized nutrition plan. Value goals: LDL < 7043mHDL > 40 mg.             Education:Diabetes - Individual verbal and written instruction to review signs/symptoms of diabetes, desired ranges of glucose level fasting, after meals and with exercise. Acknowledge that pre and post exercise glucose checks will be done for 3 sessions at entry of program. Flowsheet [facility]  Date 03/23/21  Educator [facility] Code 1- Verbalizes Understanding       Know Your Numbers and Heart Failure: - Group verbal and visual instruction to discuss disease risk factors for cardiac and pulmonary disease and treatment options.  Reviews associated critical values for Overweight/Obesity, Hypertension, Cholesterol, and Diabetes.  Discusses basics of heart failure: signs/symptoms and treatments.  Introduces Heart Failure Zone chart for action plan for heart failure.  Written material given at graduation. Flowsheet Row [facility]  Date 05/06/21  Educator [facility] Review Code 1- Verbalizes Understanding       Core Components/Risk Factors/Patient Goals Review:   Goals and Risk Factor Review     Row Name 05/04/21 1124 06/03/21 1110           Core Components/Risk Factors/Patient Goals Review   Personal Goals Review Weight Management/Obesity;Hypertension;Heart Failure Improve shortness of breath with ADL's      Review DanConor doing well. He is maintaining his weight around 207 lb, trying to maintain weight and not lose at this time. He weighs at home and knows to note any significant weight  gain. Denies any symptons with heart failure. We went over a heart failure hand out and symptoms to look out for. BP has been stable as he checks at home, he only checks it periodically and if/when he feels symptomatic. Ranges 110300-762Ustolic and 60s63Fr diastolic. Spoke to patient about their shortness of breath and what they can do to improve. Patient has been informed of breathing techniques when starting the program. Patient is informed to tell staff if they have had any med changes and that certain meds they are taking or not taking can be causing shortness of breath.      Expected Outcomes Short: Continue to monitor weight and heart failure symptoms Long: Continue to manage lifestyle risk factors Short: Attend [facility] regularly to improve shortness of breath with ADL's. Long: maintain independence with ADL's               Core Components/Risk Factors/Patient Goals at Discharge (Final Review):   Goals and Risk Factor Review - 06/03/21 1110       Core Components/Risk Factors/Patient Goals Review   Personal Goals Review Improve shortness of breath with ADL's    Review Spoke to patient about their shortness of breath and what they can do to improve. Patient has been informed of breathing techniques when starting the program. Patient is informed to tell staff if they have had any med changes and that certain meds they are taking or not taking can be causing shortness of breath.  Expected Outcomes Short: Attend [facility] regularly to improve shortness of breath with ADL's. Long: maintain independence with ADL's             ITP Comments:  ITP Comments     Row Name 03/23/21 1348 03/30/21 1624 04/01/21 1103 04/22/21 0908 05/20/21 0733   ITP Comments Virtual Visit completed. Patient informed on EP and RD appointment and 6 Minute walk test. Patient also informed of patient health questionnaires on My Chart. Patient Verbalizes understanding. Visit diagnosis can be found in [facility] 12/02/20.  Completed 6MWT and gym orientation. Initial ITP created and sent for review to Dr Ottie Glazier Medical Director. First full day of exercise!  Patient was oriented to gym and equipment including functions, settings, policies, and procedures.  Patient's individual exercise prescription and treatment plan were reviewed.  All starting workloads were established based on the results of the 6 minute walk test done at initial orientation visit.  The plan for exercise progression was also introduced and progression will be customized based on patient's performance and goals. 30 Day review completed. Medical Director ITP review done, changes made as directed, and signed approval by Medical Director. 30 Day review completed. Medical Director ITP review done, changes made as directed, and signed approval by Medical Director.    [place] Name 06/17/21 0633           ITP Comments 30 Day review completed. Medical Director ITP review done, changes made as directed, and signed approval by Medical Director.                Comments:

## 2021-06-19 ENCOUNTER — Encounter: Payer: Medicare Other | Admitting: *Deleted

## 2021-06-19 ENCOUNTER — Other Ambulatory Visit: Payer: Self-pay

## 2021-06-19 DIAGNOSIS — I5022 Chronic systolic (congestive) heart failure: Secondary | ICD-10-CM | POA: Diagnosis not present

## 2021-06-19 NOTE — Progress Notes (Signed)
Daily Session Note  Patient Details  Name: Jose Ray MRN: [id number] Date of Birth: 07-Jul-1941 Referring Provider:   Flowsheet Row Pulmonary Rehab from 03/30/2021 in [facility] Cardiac and Pulmonary Rehab  Referring Provider Jose Ray       Encounter Date: 06/19/2021  Check In:  Session Check In - 06/19/21 1115       Check-In   Supervising physician immediately available to respond to emergencies See telemetry face sheet for immediately available ER MD    Location [facility]-Cardiac & Pulmonary Rehab    Staff Present Renita Papa, RN BSN;Joseph Straughn, RCP,RRT,BSRT;Jessica [place], [place], RCEP, CCRP, CCET    Virtual Visit No    Medication changes reported     No    Fall or balance concerns reported    No    Warm-up and Cool-down Performed on first and last piece of equipment    Resistance Training Performed Yes    VAD Patient? No    PAD/SET Patient? No      Pain Assessment   Currently in Pain? No/denies                Social History   Tobacco Use  Smoking Status Former   Packs/day: 1.00   Years: 35.00   Pack years: 35.00   Types: Cigarettes   Quit date: 1995   Years since quitting: 27.7  Smokeless Tobacco Never    Goals Met:  Independence with exercise equipment Exercise tolerated well No report of concerns or symptoms today Strength training completed today  Goals Unmet:  Not Applicable  Comments: Pt able to follow exercise prescription today without complaint.  Will continue to monitor for progression.    Dr. Emily Filbert is Medical Director for [facility].  Dr. Ottie Glazier is Medical Director for [facility] Pulmonary Rehabilitation.

## 2021-06-22 ENCOUNTER — Other Ambulatory Visit: Payer: Self-pay

## 2021-06-22 ENCOUNTER — Encounter: Payer: Medicare Other | Admitting: *Deleted

## 2021-06-23 DIAGNOSIS — I5022 Chronic systolic (congestive) heart failure: Secondary | ICD-10-CM | POA: Diagnosis not present

## 2021-06-23 NOTE — Progress Notes (Signed)
Daily Session Note  Patient Details  Name: Jose Ray MRN: [id number] Date of Birth: [birth year] Referring Provider:   Flowsheet Row Pulmonary Rehab from 03/30/2021 in [facility] Cardiac and Pulmonary Rehab  Referring Provider [name]       Encounter Date: 06/23/2021  Check In:  Session Check In - 06/23/21 1114       Check-In   Supervising physician immediately available to respond to emergencies See telemetry face sheet for immediately available ER MD    Location [facility]-Cardiac & Pulmonary Rehab    Staff Present Birdie Sons, MPA, RN;Jessica Posen, MA, RCEP, CCRP, CCET;Amanda Sommer, BA, ACSM CEP, Exercise Physiologist    Virtual Visit No    Medication changes reported     No    Fall or balance concerns reported    No    Warm-up and Cool-down Performed on first and last piece of equipment    Resistance Training Performed Yes    VAD Patient? No    PAD/SET Patient? No      Pain Assessment   Currently in Pain? No/denies                Social History   Tobacco Use  Smoking Status Former   Packs/day: 1.00   Years: 35.00   Pack years: 35.00   Types: Cigarettes   Quit date: 1995   Years since quitting: 27.7  Smokeless Tobacco Never    Goals Met:  Independence with exercise equipment Exercise tolerated well No report of concerns or symptoms today Strength training completed today  Goals Unmet:  Not Applicable  Comments: Pt able to follow exercise prescription today without complaint.  Will continue to monitor for progression.    Dr. Emily Filbert is Medical Director for [facility].  Dr. Ottie Glazier is Medical Director for [facility] Pulmonary Rehabilitation.

## 2021-06-24 ENCOUNTER — Other Ambulatory Visit: Payer: Self-pay

## 2021-06-24 DIAGNOSIS — I5022 Chronic systolic (congestive) heart failure: Secondary | ICD-10-CM

## 2021-06-24 NOTE — Progress Notes (Signed)
Daily Session Note  Patient Details  Name: Jose Ray MRN: [id number] Date of Birth: Jun 19, 1941 Referring Provider:   Flowsheet Row Pulmonary Rehab from 03/30/2021 in [facility] Cardiac and Pulmonary Rehab  Referring Provider [name]       Encounter Date: 06/24/2021  Check In:  Session Check In - 06/24/21 1058       Check-In   Supervising physician immediately available to respond to emergencies See telemetry face sheet for immediately available ER MD    Location [facility]-Cardiac & Pulmonary Rehab    Staff Present Birdie Sons, MPA, Elveria Rising, BA, ACSM CEP, Exercise Physiologist;Joseph [place], [place], MA, RCEP, CCRP, CCET    Virtual Visit No    Medication changes reported     No    Fall or balance concerns reported    No    Warm-up and Cool-down Performed on first and last piece of equipment    Resistance Training Performed Yes    VAD Patient? No    PAD/SET Patient? No      Pain Assessment   Currently in Pain? No/denies                Social History   Tobacco Use  Smoking Status Former   Packs/day: 1.00   Years: 35.00   Pack years: 35.00   Types: Cigarettes   Quit date: 1995   Years since quitting: 27.7  Smokeless Tobacco Never    Goals Met:  Independence with exercise equipment Exercise tolerated well No report of concerns or symptoms today Strength training completed today  Goals Unmet:  Not Applicable  Comments: Pt able to follow exercise prescription today without complaint.  Will continue to monitor for progression.    Dr. Emily Filbert is Medical Director for [facility].  Dr. Ottie Glazier is Medical Director for [facility] Pulmonary Rehabilitation.

## 2021-06-25 NOTE — Patient Instructions (Signed)
Discharge Patient Instructions  Patient Details  Name: Jose Ray MRN: [id number] Date of Birth: Jul 18, 1941 Referring Provider:  Corey Skains, MD   Number of Visits: 36  Reason for Discharge:  Patient reached a stable level of exercise. Patient independent in their exercise. Patient has met program and personal goals.  Smoking History:  Social History   Tobacco Use  Smoking Status Former   Packs/day: 1.00   Years: 35.00   Pack years: 35.00   Types: Cigarettes   Quit date: 1995   Years since quitting: 27.7  Smokeless Tobacco Never    Diagnosis:  Heart failure, chronic systolic (HCC)  Initial Exercise Prescription:  Initial Exercise Prescription - 03/30/21 1500       Date of Initial Exercise RX and Referring Provider   Date 03/30/21    Referring Provider [name]      Oxygen   Oxygen Continuous    Liters 3      Treadmill   MPH 1.3    Grade 0    Minutes 15    METs 2      Recumbant Bike   Level 1    RPM 60    Minutes 15    METs 2      NuStep   Level 1    SPM 80    Minutes 15    METs 2      REL-XR   Level 1    Speed 50    Minutes 15    METs 2      T5 Nustep   Level 1    SPM 50    Minutes 15    METs 2      Prescription Details   Frequency (times per week) 3    Duration Progress to 30 minutes of continuous aerobic without signs/symptoms of physical distress      Intensity   THRR 40-80% of Max Heartrate 113-132    Ratings of Perceived Exertion 11-13    Perceived Dyspnea 0-4      Progression   Progression Continue to progress workloads to maintain intensity without signs/symptoms of physical distress.      Resistance Training   Training Prescription Yes    Weight 3 lb    Reps 10-15             Discharge Exercise Prescription (Final Exercise Prescription Changes):  Exercise Prescription Changes - 06/22/21 0900       Response to Exercise   Blood Pressure (Admit) 122/60    Blood Pressure (Exit) 126/64    Heart Rate  (Admit) 86 bpm    Heart Rate (Exercise) 100 bpm    Heart Rate (Exit) 92 bpm    Oxygen Saturation (Admit) 91 %    Oxygen Saturation (Exercise) 88 %    Oxygen Saturation (Exit) 93 %    Rating of Perceived Exertion (Exercise) 14    Perceived Dyspnea (Exercise) 3    Symptoms SOB    Duration Continue with 30 min of aerobic exercise without signs/symptoms of physical distress.    Intensity THRR unchanged      Progression   Progression Continue to progress workloads to maintain intensity without signs/symptoms of physical distress.    Average METs 2.15      Resistance Training   Training Prescription Yes    Weight 3 lb    Reps 10-15      Interval Training   Interval Training No      Oxygen   Oxygen  Continuous    Liters 3      Treadmill   MPH 1.6    Grade 0    Minutes 15    METs 2.23      REL-XR   Level 2    Minutes 15      Home Exercise Plan   Plans to continue exercise at Home (comment)   Walking, wants to join [employer] Add 2 additional days to program exercise sessions.   Start with 1 day   Initial Home Exercises Provided 05/04/21             Functional Capacity:  6 Minute Walk     Row Name 03/30/21 1554 06/12/21 1121       6 Minute Walk   Phase Initial Discharge    Distance 925 feet 1120 feet    Distance % Change -- 21.1 %    Distance Feet Change -- 195 ft    Walk Time 6 minutes 6 minutes    # of Rest Breaks 0 0    MPH 1.75 2.12    METS 2 2.21    RPE 17 15    Perceived Dyspnea  3 3    VO2 Peak 7 7.75    Symptoms No Yes (comment)    Comments -- SOB    Resting HR 95 bpm 85 bpm    Resting BP 124/54 128/64    Resting Oxygen Saturation  88 % 95 %    Exercise Oxygen Saturation  during 6 min walk 84 % 82 %    Max Ex. HR 108 bpm 110 bpm    Max Ex. BP 160/64 148/74    2 Minute Post BP 126/60 130/70         Interval HR   1 Minute HR 98 102    2 Minute HR 97 105    3 Minute HR 98 104    4 Minute HR 98 106    5 Minute HR 105 110    6  Minute HR 108 108    2 Minute Post HR 98 92    Interval Heart Rate? Yes Yes         Interval Oxygen   Interval Oxygen? Yes Yes    Baseline Oxygen Saturation % 88 % 95 %    1 Minute Oxygen Saturation % 87 % 84 %    1 Minute Liters of Oxygen 3 L 3 L  pulsed concentrator    2 Minute Oxygen Saturation % -- 83 %    2 Minute Liters of Oxygen 3 L 0 L    3 Minute Oxygen Saturation % 88 % 83 %    3 Minute Liters of Oxygen 3 L 0 L    4 Minute Oxygen Saturation % 85 % 84 %    4 Minute Liters of Oxygen 3 L 0 L    5 Minute Oxygen Saturation % 85 % 83 %    5 Minute Liters of Oxygen 3 L 0 L    6 Minute Oxygen Saturation % 84 % 82 %    6 Minute Liters of Oxygen 3 L 0 L    2 Minute Post Oxygen Saturation % -- 90 %    2 Minute Post Liters of Oxygen 3 L 0 L              Nutrition & Weight - Outcomes:  Pre Biometrics - 03/31/21 1009       Pre Biometrics  Height 5' 11.5" (1.816 m)    Weight 213 lb 8 oz (96.8 kg)    BMI (Calculated) 29.37    Single Leg Stand 9.8 seconds             Post Biometrics - 06/12/21 1123        Post  Biometrics   Height 5' 11.5" (1.816 m)    Weight 220 lb 6.4 oz (100 kg)    BMI (Calculated) 30.31    Single Leg Stand 2.4 seconds             Nutrition:  Nutrition Therapy & Goals - 04/08/21 1248       Nutrition Therapy   Diet Heart healthy, low Na, diabetes friendly    Drug/Food Interactions Statins/Certain Fruits    Protein (specify units) 110-115g    Fiber 30 grams    Whole Grain Foods 3 servings    Saturated Fats 12 max. grams    Fruits and Vegetables 8 servings/day    Sodium 1.5 grams      Personal Nutrition Goals   Nutrition Goal ST: add snacks in between breakfast and dinner with protein and fat like fruit with nuts or bean salad LT: meet protein needs, eat consistent carbohydrates    Comments B: Toast (whole wheat) (2 slices with low fat cream cheese) S: round of cheese D: varies: vegetables and meat S: ice cream bar or sherbert.  They just got a new monitor to see if it will work. A1C: a bit over 7 the last time he was here. They almost never eat fried food, they go out to eat maybe once per week. He will coook and he will does not typically use oil, but will put hamburger in baking dish and [place] dressing. He will also use pam to grease pan. He also does not cook with any salt. He will use truvia and less sugar when recipe calls for sugar. Discussed heart healthy eating and diabetes friendly eating.      Intervention Plan   Intervention Prescribe, educate and counsel regarding individualized specific dietary modifications aiming towards targeted core components such as weight, hypertension, lipid management, diabetes, heart failure and other comorbidities.;Nutrition handout(s) given to patient.    Expected Outcomes Short Term Goal: Understand basic principles of dietary content, such as calories, fat, sodium, cholesterol and nutrients.;Short Term Goal: A plan has been developed with personal nutrition goals set during dietitian appointment.;Long Term Goal: Adherence to prescribed nutrition plan.             Goals reviewed with patient; copy given to patient.

## 2021-06-26 ENCOUNTER — Other Ambulatory Visit: Payer: Self-pay

## 2021-06-26 ENCOUNTER — Encounter: Payer: Medicare Other | Admitting: *Deleted

## 2021-06-26 DIAGNOSIS — I5022 Chronic systolic (congestive) heart failure: Secondary | ICD-10-CM

## 2021-06-26 NOTE — Progress Notes (Signed)
Daily Session Note  Patient Details  Name: Jose Ray MRN: [id number] Date of Birth: [birth year] Referring Provider:   Flowsheet Row Pulmonary Rehab from 03/30/2021 in [facility] Cardiac and Pulmonary Rehab  Referring Provider Jose Ray       Encounter Date: 06/26/2021  Check In:  Session Check In - 06/26/21 1100       Check-In   Supervising physician immediately available to respond to emergencies See telemetry face sheet for immediately available ER MD    Location [facility]-Cardiac & Pulmonary Rehab    Staff Present Renita Papa, RN BSN;Joseph [place], RCP,RRT,BSRT;Jessica [place], [place], RCEP, CCRP, CCET    Virtual Visit No    Medication changes reported     No    Fall or balance concerns reported    No    Warm-up and Cool-down Performed on first and last piece of equipment    Resistance Training Performed Yes    VAD Patient? No    PAD/SET Patient? No      Pain Assessment   Currently in Pain? No/denies                Social History   Tobacco Use  Smoking Status Former   Packs/day: 1.00   Years: 35.00   Pack years: 35.00   Types: Cigarettes   Quit date: 1995   Years since quitting: 27.7  Smokeless Tobacco Never    Goals Met:  Independence with exercise equipment Exercise tolerated well No report of concerns or symptoms today Strength training completed today  Goals Unmet:  Not Applicable  Comments:  Jose Ray graduated today from  rehab with 36 sessions completed.  Details of the patient's exercise prescription and what He needs to do in order to continue the prescription and progress were discussed with patient.  Patient was given a copy of prescription and goals.  Patient verbalized understanding.  Jose Ray plans to continue to exercise by attending the [facility].    Dr. Emily Filbert is Medical Director for [place].  Dr. Ottie Glazier is Medical Director for [facility] Pulmonary Rehabilitation.

## 2021-06-26 NOTE — Progress Notes (Signed)
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 (Patient not taking: Reported on 03/23/2021), Disp: , Rfl:    atorvastatin (LIPITOR) 80 MG tablet, Take 1 tablet by mouth at bedtime., Disp: , Rfl:    famotidine (PEPCID) 10 MG tablet, Take 10 mg by mouth daily., Disp: , Rfl:    fexofenadine (ALLEGRA) 180 MG tablet, Take 180 mg by mouth at bedtime., Disp: , Rfl:    fluticasone (FLONASE) 50 MCG/ACT nasal spray, Place 2 sprays into both nostrils at bedtime., Disp: , Rfl:    fluticasone-salmeterol (ADVAIR) 250-50 MCG/ACT AEPB, Advair Diskus 250 mcg-50 mcg/dose powder for inhalation (Patient not taking: Reported on 03/23/2021), Disp: , Rfl:    Fluticasone-Salmeterol (ADVAIR) 250-50 MCG/DOSE AEPB, Inhale 1 puff into the lungs 2 (two) times daily., Disp: , Rfl:    glipiZIDE (GLUCOTROL) 10 MG tablet, Take 10 mg by mouth daily before breakfast., Disp: , Rfl:    hydrochlorothiazide (HYDRODIURIL) 25 MG tablet, Take 25 mg by mouth daily., Disp: , Rfl:    hydrochlorothiazide (HYDRODIURIL) 25 MG tablet, Take 1 tablet by mouth daily. (Patient not taking: Reported on 03/23/2021), Disp: , Rfl:    icosapent Ethyl (VASCEPA) 1 g capsule, Take 2 g by mouth 2 (two) times daily., Disp: , Rfl:    isosorbide mononitrate (IMDUR) 30 MG 24 hr tablet, Take 30 mg by mouth daily., Disp: , Rfl:    losartan (COZAAR) 50 MG  tablet, Take 100 mg by mouth daily., Disp: , Rfl:    metFORMIN (GLUCOPHAGE) 1000 MG tablet, Take 1,000 mg by mouth 2 (two) times daily., Disp: , Rfl:    metoprolol succinate (TOPROL-XL) 100 MG 24 hr tablet, Take 50 mg by mouth daily. Take with or immediately following a meal. (Patient not taking: Reported on 03/23/2021), Disp: , Rfl:    metoprolol succinate (TOPROL-XL) 25 MG 24 hr tablet, Take 1 tablet by mouth daily., Disp: , Rfl:    Multiple Vitamins-Minerals (PRESERVISION AREDS 2) CAPS, Take 1 capsule by mouth at bedtime. (Patient not taking: Reported on 03/23/2021), Disp: , Rfl:    OXYGEN, Inhale 2.5-3 L into the lungs See admin instructions. Used while sleeping and physical activity, Disp: , Rfl:    Polyethyl Glycol-Propyl Glycol 0.4-0.3 % SOLN, Place 1 drop into both eyes See admin instructions. Instill 1 drop into both eyes at night, may use during the day as needed for dry eyes, Disp: , Rfl:    tiotropium (SPIRIVA) 18 MCG inhalation capsule, Place 18 mcg into inhaler and inhale daily., Disp: , Rfl:   Past Medical History: Past Medical History:  Diagnosis Date   Acute MI ([place])    CHF (congestive heart failure) (HCC)    COPD (chronic obstructive pulmonary disease) (HCC)    Coronary artery disease    Diabetes mellitus without complication (HCC)    GERD (gastroesophageal reflux disease)  History of hiatal hernia    Hypertension     Tobacco Use: Social History   Tobacco Use  Smoking Status Former   Packs/day: 1.00   Years: 35.00   Pack years: 35.00   Types: Cigarettes   Quit date: 1995   Years since quitting: 27.7  Smokeless Tobacco Never    Labs: Recent Review Scientist, physiological     Labs for ITP Cardiac and Pulmonary Rehab Latest Ref Rng & Units 10/25/2016   Hemoglobin A1c 4.8 - 5.6 % 7.0(H)        Pulmonary Assessment Scores:  Pulmonary Assessment Scores     Row Name 03/30/21 1607 06/12/21 1123 06/17/21 1237     ADL UCSD   ADL Phase -- Exit --   SOB Score total 60  -- 68   Rest 1 -- 2   Walk 3 -- 2   Stairs 5 -- 5   Bath 2 -- 3   Dress 1 -- 2   Shop 2 -- 4     CAT Score   CAT Score 22 -- 22     mMRC Score   mMRC Score 2 2 --            UCSD: Self-administered rating of dyspnea associated with activities of daily living (ADLs) 6-point scale (0 = "not at all" to 5 = "maximal or unable to do because of breathlessness")  Scoring Scores range from 0 to 120.  Minimally important difference is 5 units  CAT: CAT can identify the health impairment of COPD patients and is better correlated with disease progression.  CAT has a scoring range of zero to 40. The CAT score is classified into four groups of low (less than 10), medium (10 - 20), high (21-30) and very high (31-40) based on the impact level of disease on health status. A CAT score over 10 suggests significant symptoms.  A worsening CAT score could be explained by an exacerbation, poor medication adherence, poor inhaler technique, or progression of COPD or comorbid conditions.  CAT MCID is 2 points  mMRC: mMRC (Modified Medical Research Council) Dyspnea Scale is used to assess the degree of baseline functional disability in patients of respiratory disease due to dyspnea. No minimal important difference is established. A decrease in score of 1 point or greater is considered a positive change.   Pulmonary Function Assessment:  Pulmonary Function Assessment - 03/23/21 1343       Breath   Shortness of Breath Yes;Limiting activity             Exercise Target Goals: Exercise Program Goal: Individual exercise prescription set using results from initial 6 min walk test and THRR while considering  patient's activity barriers and safety.   Exercise Prescription Goal: Initial exercise prescription builds to 30-45 minutes a day of aerobic activity, 2-3 days per week.  Home exercise guidelines will be given to patient during program as part of exercise prescription that the participant will  acknowledge.  Education: Aerobic Exercise: - Group verbal and visual presentation on the components of exercise prescription. Introduces F.I.T.T principle from ACSM for exercise prescriptions.  Reviews F.I.T.T. principles of aerobic exercise including progression. Written material given at graduation. Flowsheet Row Pulmonary Rehab from 06/24/2021 in [facility] Cardiac and Pulmonary Rehab  Date 06/03/21  Educator [facility]  Instruction Review Code 1- Verbalizes Understanding       Education: Resistance Exercise: - Group verbal and visual presentation on the components of exercise prescription. Introduces F.I.T.T principle from ACSM  for exercise prescriptions  Reviews F.I.T.T. principles of resistance exercise including progression. Written material given at graduation. Flowsheet Row Pulmonary Rehab from 06/24/2021 in [facility] Cardiac and Pulmonary Rehab  Date 04/08/21  Educator [facility]  Instruction Review Code 1- Verbalizes Understanding        Education: Exercise & Equipment Safety: - Individual verbal instruction and demonstration of equipment use and safety with use of the equipment. Flowsheet Row Pulmonary Rehab from 06/24/2021 in [facility] Cardiac and Pulmonary Rehab  Date 03/30/21  Educator [facility]  Instruction Review Code 1- Verbalizes Understanding       Education: Exercise Physiology & General Exercise Guidelines: - Group verbal and written instruction with models to review the exercise physiology of the cardiovascular system and associated critical values. Provides general exercise guidelines with specific guidelines to those with heart or lung disease.  Flowsheet Row Pulmonary Rehab from 06/24/2021 in [facility] Cardiac and Pulmonary Rehab  Date 05/27/21  Educator AS  Instruction Review Code 1- Verbalizes Understanding       Education: Flexibility, Balance, Mind/Body Relaxation: - Group verbal and visual presentation with interactive activity on the components of exercise prescription. Introduces F.I.T.T  principle from ACSM for exercise prescriptions. Reviews F.I.T.T. principles of flexibility and balance exercise training including progression. Also discusses the mind body connection.  Reviews various relaxation techniques to help reduce and manage stress (i.e. Deep breathing, progressive muscle relaxation, and visualization). Balance handout provided to take home. Written material given at graduation. Flowsheet Row Pulmonary Rehab from 06/24/2021 in [facility] Cardiac and Pulmonary Rehab  Date 06/17/21  Educator [facility]  Instruction Review Code 1- Verbalizes Understanding       Activity Barriers & Risk Stratification:   6 Minute Walk:  6 Minute Walk     Row Name 03/30/21 1554 06/12/21 1121       6 Minute Walk   Phase Initial Discharge    Distance 925 feet 1120 feet    Distance % Change -- 21.1 %    Distance Feet Change -- 195 ft    Walk Time 6 minutes 6 minutes    # of Rest Breaks 0 0    MPH 1.75 2.12    METS 2 2.21    RPE 17 15    Perceived Dyspnea  3 3    VO2 Peak 7 7.75    Symptoms No Yes (comment)    Comments -- SOB    Resting HR 95 bpm 85 bpm    Resting BP 124/54 128/64    Resting Oxygen Saturation  88 % 95 %    Exercise Oxygen Saturation  during 6 min walk 84 % 82 %    Max Ex. HR 108 bpm 110 bpm    Max Ex. BP 160/64 148/74    2 Minute Post BP 126/60 130/70         Interval HR   1 Minute HR 98 102    2 Minute HR 97 105    3 Minute HR 98 104    4 Minute HR 98 106    5 Minute HR 105 110    6 Minute HR 108 108    2 Minute Post HR 98 92    Interval Heart Rate? Yes Yes         Interval Oxygen   Interval Oxygen? Yes Yes    Baseline Oxygen Saturation % 88 % 95 %    1 Minute Oxygen Saturation % 87 % 84 %    1 Minute Liters of Oxygen  3 L 3 L  pulsed concentrator    2 Minute Oxygen Saturation % -- 83 %    2 Minute Liters of Oxygen 3 L 0 L    3 Minute Oxygen Saturation % 88 % 83 %    3 Minute Liters of Oxygen 3 L 0 L    4 Minute Oxygen Saturation % 85 % 84 %    4 Minute  Liters of Oxygen 3 L 0 L    5 Minute Oxygen Saturation % 85 % 83 %    5 Minute Liters of Oxygen 3 L 0 L    6 Minute Oxygen Saturation % 84 % 82 %    6 Minute Liters of Oxygen 3 L 0 L    2 Minute Post Oxygen Saturation % -- 90 %    2 Minute Post Liters of Oxygen 3 L 0 L            Oxygen Initial Assessment:  Oxygen Initial Assessment - 03/23/21 1341       Home Oxygen   Home Oxygen Device Home Concentrator;Portable Concentrator;E-Tanks    Sleep Oxygen Prescription Continuous    Liters per minute 3    Home Exercise Oxygen Prescription Continuous    Liters per minute 3    Home Resting Oxygen Prescription None    Liters per minute 0    Compliance with Home Oxygen Use Yes      Initial 6 min Walk   Oxygen Used Continuous    Liters per minute 3      Program Oxygen Prescription   Program Oxygen Prescription Continuous    Liters per minute 3      Intervention   Short Term Goals To learn and exhibit compliance with exercise, home and travel O2 prescription;To learn and understand importance of monitoring SPO2 with pulse oximeter and demonstrate accurate use of the pulse oximeter.;To learn and understand importance of maintaining oxygen saturations>88%;To learn and demonstrate proper pursed lip breathing techniques or other breathing techniques. ;To learn and demonstrate proper use of respiratory medications    Long  Term Goals Exhibits compliance with exercise, home  and travel O2 prescription;Verbalizes importance of monitoring SPO2 with pulse oximeter and return demonstration;Maintenance of O2 saturations>88%;Exhibits proper breathing techniques, such as pursed lip breathing or other method taught during program session;Compliance with respiratory medication;Demonstrates proper use of MDI's             Oxygen Re-Evaluation:  Oxygen Re-Evaluation     Row Name 04/01/21 1104 06/03/21 1111           Program Oxygen Prescription   Program Oxygen Prescription Continuous  Continuous      Liters per minute 3 3             Home Oxygen   Home Oxygen Device Home Concentrator;Portable Concentrator;E-Tanks Home Concentrator;Portable Concentrator;E-Tanks      Sleep Oxygen Prescription Continuous Continuous      Liters per minute 3 3      Home Exercise Oxygen Prescription Continuous Continuous      Liters per minute 3 3      Home Resting Oxygen Prescription None None      Liters per minute 0 0  prn for rest      Compliance with Home Oxygen Use Yes Yes             Goals/Expected Outcomes   Short Term Goals To learn and exhibit compliance with exercise, home and travel O2 prescription;To learn and  understand importance of monitoring SPO2 with pulse oximeter and demonstrate accurate use of the pulse oximeter.;To learn and understand importance of maintaining oxygen saturations>88%;To learn and demonstrate proper pursed lip breathing techniques or other breathing techniques.  To learn and demonstrate proper pursed lip breathing techniques or other breathing techniques.       Long  Term Goals Exhibits compliance with exercise, home  and travel O2 prescription;Verbalizes importance of monitoring SPO2 with pulse oximeter and return demonstration;Maintenance of O2 saturations>88%;Exhibits proper breathing techniques, such as pursed lip breathing or other method taught during program session Exhibits proper breathing techniques, such as pursed lip breathing or other method taught during program session      Comments Reviewed PLB technique with pt.  Talked about how it works and it's importance in maintaining their exercise saturations. Diaphragmatic and PLB breathing explained and performed with patient. Patient has a better understanding of how to do these exercises to help with breathing performance and relaxation. Patient performed breathing techniques adequately and to practice further at home.      Goals/Expected Outcomes Short: Become more profiecient at using PLB.   Long:  Become independent at using PLB. Short: practice PLB and diaphragmatic breathing at home. Long: Use PLB and diaphragmatic breathing independently post [employer].               Oxygen Discharge (Final Oxygen Re-Evaluation):  Oxygen Re-Evaluation - 06/03/21 1111       Program Oxygen Prescription   Program Oxygen Prescription Continuous    Liters per minute 3      Home Oxygen   Home Oxygen Device Home Concentrator;Portable Concentrator;E-Tanks    Sleep Oxygen Prescription Continuous    Liters per minute 3    Home Exercise Oxygen Prescription Continuous    Liters per minute 3    Home Resting Oxygen Prescription None    Liters per minute 0   prn for rest   Compliance with Home Oxygen Use Yes      Goals/Expected Outcomes   Short Term Goals To learn and demonstrate proper pursed lip breathing techniques or other breathing techniques.     Long  Term Goals Exhibits proper breathing techniques, such as pursed lip breathing or other method taught during program session    Comments Diaphragmatic and PLB breathing explained and performed with patient. Patient has a better understanding of how to do these exercises to help with breathing performance and relaxation. Patient performed breathing techniques adequately and to practice further at home.    Goals/Expected Outcomes Short: practice PLB and diaphragmatic breathing at home. Long: Use PLB and diaphragmatic breathing independently post [employer].             Initial Exercise Prescription:  Initial Exercise Prescription - 03/30/21 1500       Date of Initial Exercise RX and Referring Provider   Date 03/30/21    Referring Provider [name]      Oxygen   Oxygen Continuous    Liters 3      Treadmill   MPH 1.3    Grade 0    Minutes 15    METs 2      Recumbant Bike   Level 1    RPM 60    Minutes 15    METs 2      NuStep   Level 1    SPM 80    Minutes 15    METs 2      REL-XR   Level 1    Speed  50    Minutes 15     METs 2      T5 Nustep   Level 1    SPM 50    Minutes 15    METs 2      Prescription Details   Frequency (times per week) 3    Duration Progress to 30 minutes of continuous aerobic without signs/symptoms of physical distress      Intensity   THRR 40-80% of Max Heartrate 113-132    Ratings of Perceived Exertion 11-13    Perceived Dyspnea 0-4      Progression   Progression Continue to progress workloads to maintain intensity without signs/symptoms of physical distress.      Resistance Training   Training Prescription Yes    Weight 3 lb    Reps 10-15             Perform Capillary Blood Glucose checks as needed.  Exercise Prescription Changes:   Exercise Prescription Changes     Row Name 04/16/21 0900 04/29/21 1500 05/04/21 1100 05/13/21 1100 05/28/21 0800     Response to Exercise   Blood Pressure (Admit) 132/68 122/64 -- 116/62 --   Blood Pressure (Exercise) 128/64 -- -- 152/64 --   Blood Pressure (Exit) 102/60 112/60 -- 114/60 102/62   Heart Rate (Admit) 75 bpm 88 bpm -- 80 bpm 88 bpm   Heart Rate (Exercise) 98 bpm 95 bpm -- 103 bpm 92 bpm   Heart Rate (Exit) 93 bpm 88 bpm -- 93 bpm 89 bpm   Oxygen Saturation (Admit) 92 % 97 % -- 90 % 92 %   Oxygen Saturation (Exercise) 90 % 88 % -- 89 % 90 %   Oxygen Saturation (Exit) 93 % 90 % -- 90 % 91 %   Rating of Perceived Exertion (Exercise) 13 12 -- 13 14   Perceived Dyspnea (Exercise) 1 2 -- 1 3   Symptoms none SOB -- -- SOB   Comments first full week of exercise -- -- -- --   Duration Progress to 30 minutes of  aerobic without signs/symptoms of physical distress Continue with 30 min of aerobic exercise without signs/symptoms of physical distress. -- Continue with 30 min of aerobic exercise without signs/symptoms of physical distress. Continue with 30 min of aerobic exercise without signs/symptoms of physical distress.   Intensity THRR unchanged THRR unchanged -- THRR unchanged THRR unchanged     Progression    Progression Continue to progress workloads to maintain intensity without signs/symptoms of physical distress. Continue to progress workloads to maintain intensity without signs/symptoms of physical distress. -- Continue to progress workloads to maintain intensity without signs/symptoms of physical distress. Continue to progress workloads to maintain intensity without signs/symptoms of physical distress.   Average METs 2.06 2.38 -- 2 2.45     Resistance Training   Training Prescription Yes Yes -- Yes Yes   Weight 5 lb 3 lb -- 3 lb 3 lb   Reps 10-15 10-15 -- 10-15 10-15     Interval Training   Interval Training -- No -- No No     Oxygen   Oxygen Continuous Continuous -- Continuous Continuous   Liters 3 3 -- 3 3     Treadmill   MPH 1.3 1.3 -- -- 1.6   Grade 0 0 -- -- 0   Minutes 15 15 -- -- 15   METs 2 2 -- -- 2.23     Recumbant Bike   Level 1 1 -- 1 2.6  Minutes 18 18 -- 15 15   METs 2.82 2.63 -- -- 2.81     REL-XR   Level -- 2 -- -- 2   Minutes -- 15 -- -- 15   METs -- 3 -- -- 2.23     T5 Nustep   Level 3 2 -- 3 3   Minutes 15 15 -- 15 15   METs 1.9 1.9 -- 2 1.9     Home Exercise Plan   Plans to continue exercise at -- -- Home (comment)  Walking, wants to join [employer] (comment)  Walking, wants to join [employer] (comment)  Walking, wants to join [employer] -- -- Add 2 additional days to program exercise sessions.  Start with 1 day Add 2 additional days to program exercise sessions.  Start with 1 day Add 2 additional days to program exercise sessions.  Start with 1 day   Initial Home Exercises Provided -- -- 05/04/21 05/04/21 05/04/21    Row Name 06/09/21 1300 06/22/21 0900           Response to Exercise   Blood Pressure (Admit) 116/62 122/60      Blood Pressure (Exit) 110/58 126/64      Heart Rate (Admit) 84 bpm 86 bpm      Heart Rate (Exercise) 87 bpm 100 bpm      Heart Rate (Exit) 82 bpm 92 bpm      Oxygen Saturation (Admit) 93 % 91 %       Oxygen Saturation (Exercise) 90 % 88 %      Oxygen Saturation (Exit) 93 % 93 %      Rating of Perceived Exertion (Exercise) 13 14      Perceived Dyspnea (Exercise) 3 3      Symptoms -- SOB      Duration Continue with 30 min of aerobic exercise without signs/symptoms of physical distress. Continue with 30 min of aerobic exercise without signs/symptoms of physical distress.      Intensity THRR unchanged THRR unchanged             Progression   Progression Continue to progress workloads to maintain intensity without signs/symptoms of physical distress. Continue to progress workloads to maintain intensity without signs/symptoms of physical distress.      Average METs 2.8 2.15             Resistance Training   Training Prescription Yes Yes      Weight 3 lb 3 lb      Reps 10-15 10-15             Interval Training   Interval Training No No             Oxygen   Oxygen Continuous Continuous      Liters 3 3             Treadmill   MPH 1.6 1.6      Grade 0 0      Minutes 15 15      METs 2.23 2.23             REL-XR   Level 2 2      Minutes 15 15      METs 3.3 --             Home Exercise Plan   Plans to continue exercise at Home (comment)  Walking, wants to join [facility] (comment)  Walking, wants to join [employer]  Frequency Add 2 additional days to program exercise sessions.  Start with 1 day Add 2 additional days to program exercise sessions.  Start with 1 day      Initial Home Exercises Provided 05/04/21 05/04/21               Exercise Comments:   Exercise Comments     Row Name 04/01/21 1103           Exercise Comments First full day of exercise!  Patient was oriented to gym and equipment including functions, settings, policies, and procedures.  Patient's individual exercise prescription and treatment plan were reviewed.  All starting workloads were established based on the results of the 6 minute walk test done at initial orientation visit.  The plan for  exercise progression was also introduced and progression will be customized based on patient's performance and goals.                Exercise Goals and Review:   Exercise Goals     Row Name 03/30/21 1617             Exercise Goals   Increase Physical Activity Yes       Intervention Provide advice, education, support and counseling about physical activity/exercise needs.;Develop an individualized exercise prescription for aerobic and resistive training based on initial evaluation findings, risk stratification, comorbidities and participant's personal goals.       Expected Outcomes Short Term: Attend rehab on a regular basis to increase amount of physical activity.;Long Term: Add in home exercise to make exercise part of routine and to increase amount of physical activity.;Long Term: Exercising regularly at least 3-5 days a week.       Increase Strength and Stamina Yes       Intervention Provide advice, education, support and counseling about physical activity/exercise needs.;Develop an individualized exercise prescription for aerobic and resistive training based on initial evaluation findings, risk stratification, comorbidities and participant's personal goals.       Expected Outcomes Short Term: Increase workloads from initial exercise prescription for resistance, speed, and METs.;Short Term: Perform resistance training exercises routinely during rehab and add in resistance training at home;Long Term: Improve cardiorespiratory fitness, muscular endurance and strength as measured by increased METs and functional capacity (6MWT)       Able to understand and use rate of perceived exertion (RPE) scale Yes       Intervention Provide education and explanation on how to use RPE scale       Expected Outcomes Short Term: Able to use RPE daily in rehab to express subjective intensity level;Long Term:  Able to use RPE to guide intensity level when exercising independently       Able to understand  and use Dyspnea scale Yes       Intervention Provide education and explanation on how to use Dyspnea scale       Expected Outcomes Short Term: Able to use Dyspnea scale daily in rehab to express subjective sense of shortness of breath during exertion;Long Term: Able to use Dyspnea scale to guide intensity level when exercising independently       Knowledge and understanding of Target Heart Rate Range (THRR) Yes       Intervention Provide education and explanation of THRR including how the numbers were predicted and where they are located for reference       Expected Outcomes Short Term: Able to state/look up THRR;Short Term: Able to use daily as guideline for intensity in rehab;Long  Term: Able to use THRR to govern intensity when exercising independently       Able to check pulse independently Yes       Intervention Provide education and demonstration on how to check pulse in carotid and radial arteries.;Review the importance of being able to check your own pulse for safety during independent exercise       Expected Outcomes Short Term: Able to explain why pulse checking is important during independent exercise;Long Term: Able to check pulse independently and accurately       Understanding of Exercise Prescription Yes       Intervention Provide education, explanation, and written materials on patient's individual exercise prescription       Expected Outcomes Short Term: Able to explain program exercise prescription;Long Term: Able to explain home exercise prescription to exercise independently                Exercise Goals Re-Evaluation :  Exercise Goals Re-Evaluation     Row Name 04/01/21 1104 04/16/21 0919 04/29/21 1538 05/04/21 1147 05/13/21 1117     Exercise Goal Re-Evaluation   Exercise Goals Review Increase Physical Activity;Able to understand and use rate of perceived exertion (RPE) scale;Knowledge and understanding of Target Heart Rate Range (THRR);Understanding of Exercise  Prescription;Increase Strength and Stamina;Able to understand and use Dyspnea scale;Able to check pulse independently Increase Physical Activity;Increase Strength and Stamina;Understanding of Exercise Prescription Increase Physical Activity;Increase Strength and Stamina;Understanding of Exercise Prescription Increase Physical Activity;Increase Strength and Stamina;Understanding of Exercise Prescription Increase Physical Activity;Increase Strength and Stamina   Comments Reviewed RPE and dyspnea scales, THR and program prescription with pt today.  Pt voiced understanding and was given a copy of goals to take home. Jose Ray is off to a good start as last week was his first full week of exercise. He has already increased to level 3 on the T5 Nustep and 5 lbs for hand weights. Will continue to monitor progress. Jose Ray is doing well in rehab.  He is up to 18 watts on the bike.  We will continue to monitor his progress. Reviewed home exercise with pt today.  Pt plans to walk for exercise. Patient is thinking about joining the [employer] and was provided a brochure. He has a recumbant bike at home that he may try to make work again as it hasn't worked for a while.  Reviewed THR, pulse, RPE, sign and symptoms, pulse oximetery and when to call 911 or MD.  Also discussed weather considerations and indoor options.  Pt voiced understanding. Jose Ray is doing well.  His oxygen stays above 88% during exercise sessions.  He has increased level on T5.  Staff wil encourage increasing weight for strength work   Expected Outcomes Short: Use RPE daily to regulate intensity. Long: Follow program prescription in THR. Short: Continue to build up speed on TM Long: Continue to increase overall strength and stamina Short: Move back up to level 3 on the T5  Long: Continue to improve stamina Short: Add on 1 day of exercise at home: Continue to exercise independently at home at appropriate prescription Short: maintain consistent attendance Long: continue  to build stamina    Row Name 05/28/21 0842 06/09/21 1355 06/22/21 0940         Exercise Goal Re-Evaluation   Exercise Goals Review Increase Physical Activity;Increase Strength and Stamina Increase Physical Activity;Increase Strength and Stamina Increase Physical Activity;Increase Strength and Stamina     Comments Jose Ray is doing well in rehab. He did increase his treadmill  speed to 1.6. Unfortunately, he is not quite hitting his [place] during exercise and will encourage to meet that standard as best as possible. Will continue to monitor. Jose Ray continue sto do well. he attends consistently and oxygen stays in the 90s.  Staff will encourage him to try 4 lb for strength work. Jose Ray is keeping up with his exercise prescription. He completed his post 6MWT and improved by 21%!! Jose Ray should be encouraged to increase his handweights to 4 lbs. Will continue to monitor until graduation.     Expected Outcomes Short: Continue to increase loads on machines Long: Continue to increase overall MET level Short: try 4 ln Long:  build overall stamina Short: Graduate Long: Continue to increase and maintain strength/stamina post graduation              Discharge Exercise Prescription (Final Exercise Prescription Changes):  Exercise Prescription Changes - 06/22/21 0900       Response to Exercise   Blood Pressure (Admit) 122/60    Blood Pressure (Exit) 126/64    Heart Rate (Admit) 86 bpm    Heart Rate (Exercise) 100 bpm    Heart Rate (Exit) 92 bpm    Oxygen Saturation (Admit) 91 %    Oxygen Saturation (Exercise) 88 %    Oxygen Saturation (Exit) 93 %    Rating of Perceived Exertion (Exercise) 14    Perceived Dyspnea (Exercise) 3    Symptoms SOB    Duration Continue with 30 min of aerobic exercise without signs/symptoms of physical distress.    Intensity THRR unchanged      Progression   Progression Continue to progress workloads to maintain intensity without signs/symptoms of physical distress.    Average  METs 2.15      Resistance Training   Training Prescription Yes    Weight 3 lb    Reps 10-15      Interval Training   Interval Training No      Oxygen   Oxygen Continuous    Liters 3      Treadmill   MPH 1.6    Grade 0    Minutes 15    METs 2.23      REL-XR   Level 2    Minutes 15      Home Exercise Plan   Plans to continue exercise at Home (comment)   Walking, wants to join [employer] Add 2 additional days to program exercise sessions.   Start with 1 day   Initial Home Exercises Provided 05/04/21             Nutrition:  Target Goals: Understanding of nutrition guidelines, daily intake of sodium <1572m, cholesterol <2085m calories 30% from fat and 7% or less from saturated fats, daily to have 5 or more servings of fruits and vegetables.  Education: All About Nutrition: -Group instruction provided by verbal, written material, interactive activities, discussions, models, and posters to present general guidelines for heart healthy nutrition including fat, fiber, MyPlate, the role of sodium in heart healthy nutrition, utilization of the nutrition label, and utilization of this knowledge for meal planning. Follow up email sent as well. Written material given at graduation. [facility]  Date 04/22/21  [facility] Review Code 1- Verbalizes Understanding       Biometrics:  Pre Biometrics - 03/31/21 1009       Pre Biometrics   Height 5' 11.5" (1.816 m)  Weight 213 lb 8 oz (96.8 kg)    BMI (Calculated) 29.37    Single Leg Stand 9.8 seconds             Post Biometrics - 06/12/21 1123        Post  Biometrics   Height 5' 11.5" (1.816 m)    Weight 220 lb 6.4 oz (100 kg)    BMI (Calculated) 30.31    Single Leg Stand 2.4 seconds             Nutrition Therapy Plan and Nutrition Goals:  Nutrition Therapy & Goals - 04/08/21 1248       Nutrition Therapy   Diet  Heart healthy, low Na, diabetes friendly    Drug/Food Interactions Statins/Certain Fruits    Protein (specify units) 110-115g    Fiber 30 grams    Whole Grain Foods 3 servings    Saturated Fats 12 max. grams    Fruits and Vegetables 8 servings/day    Sodium 1.5 grams      Personal Nutrition Goals   Nutrition Goal ST: add snacks in between breakfast and dinner with protein and fat like fruit with nuts or bean salad LT: meet protein needs, eat consistent carbohydrates    Comments B: Toast (whole wheat) (2 slices with low fat cream cheese) S: round of cheese D: varies: vegetables and meat S: ice cream bar or sherbert. They just got a new monitor to see if it will work. A1C: a bit over 7 the last time he was here. They almost never eat fried food, they go out to eat maybe once per week. He will coook and he will does not typically use oil, but will put hamburger in baking dish and [place] dressing. He will also use pam to grease pan. He also does not cook with any salt. He will use truvia and less sugar when recipe calls for sugar. Discussed heart healthy eating and diabetes friendly eating.      Intervention Plan   Intervention Prescribe, educate and counsel regarding individualized specific dietary modifications aiming towards targeted core components such as weight, hypertension, lipid management, diabetes, heart failure and other comorbidities.;Nutrition handout(s) given to patient.    Expected Outcomes Short Term Goal: Understand basic principles of dietary content, such as calories, fat, sodium, cholesterol and nutrients.;Short Term Goal: A plan has been developed with personal nutrition goals set during dietitian appointment.;Long Term Goal: Adherence to prescribed nutrition plan.             Nutrition Assessments:  MEDIFICTS Score Key: ?70 Need to make dietary changes  40-70 Heart Healthy Diet ? 40 Therapeutic Level Cholesterol Diet  Flowsheet Row Pulmonary Rehab from 06/17/2021 in  [facility] Cardiac and Pulmonary Rehab  Picture Your Plate Total Score on Discharge 75      Picture Your Plate Scores: <15 Unhealthy dietary pattern with much room for improvement. 41-50 Dietary pattern unlikely to meet recommendations for good health and room for improvement. 51-60 More healthful dietary pattern, with some room for improvement.  >60 Healthy dietary pattern, although there may be some specific behaviors that could be improved.   Nutrition Goals Re-Evaluation:  Nutrition Goals Re-Evaluation     [place] Name 06/03/21 1114             Goals   Nutrition Goal Eat a lower fat diet       Comment Jose Ray states that he eats a lower fat diet and has 2 vegetables in every meal except breakfast.  He would like to drop some weight.       Expected Outcome Short: lost some weight. Long: reach a weight goal of less than 200.                Nutrition Goals Discharge (Final Nutrition Goals Re-Evaluation):  Nutrition Goals Re-Evaluation - 06/03/21 1114       Goals   Nutrition Goal Eat a lower fat diet    Comment Jose Ray states that he eats a lower fat diet and has 2 vegetables in every meal except breakfast. He would like to drop some weight.    Expected Outcome Short: lost some weight. Long: reach a weight goal of less than 200.             Psychosocial: Target Goals: Acknowledge presence or absence of significant depression and/or stress, maximize coping skills, provide positive support system. Participant is able to verbalize types and ability to use techniques and skills needed for reducing stress and depression.   Education: Stress, Anxiety, and Depression - Group verbal and visual presentation to define topics covered.  Reviews how body is impacted by stress, anxiety, and depression.  Also discusses healthy ways to reduce stress and to treat/manage anxiety and depression.  Written material given at graduation. Flowsheet Row Pulmonary Rehab from 06/24/2021 in [facility] Cardiac and  Pulmonary Rehab  Date 05/20/21  Educator [facility]  Instruction Review Code 1- Verbalizes Understanding       Education: Sleep Hygiene -Provides group verbal and written instruction about how sleep can affect your health.  Define sleep hygiene, discuss sleep cycles and impact of sleep habits. Review good sleep hygiene tips.    Initial Review & Psychosocial Screening:  Initial Psych Review & Screening - 03/23/21 1346       Initial Review   Current issues with None Identified      Family Dynamics   Good Support System? Yes    Comments Jose Ray can look to his daughter that lives nearby and his wife for support. He states that his mental health is great.      Barriers   Psychosocial barriers to participate in program The patient should benefit from training in stress management and relaxation.;There are no identifiable barriers or psychosocial needs.      Screening Interventions   Interventions To provide support and resources with identified psychosocial needs;Provide feedback about the scores to participant;Encouraged to exercise    Expected Outcomes Short Term goal: Utilizing psychosocial counselor, staff and physician to assist with identification of specific Stressors or current issues interfering with healing process. Setting desired goal for each stressor or current issue identified.;Long Term Goal: Stressors or current issues are controlled or eliminated.;Short Term goal: Identification and review with participant of any Quality of Life or Depression concerns found by scoring the questionnaire.;Long Term goal: The participant improves quality of Life and PHQ9 Scores as seen by post scores and/or verbalization of changes             Quality of Life Scores:  Quality of Life - 03/30/21 1612       Quality of Life   Select Quality of Life      Quality of Life Scores   Health/Function Pre 21.07 %    Socioeconomic Pre 22.71 %    Psych/Spiritual Pre 20.86 %    Family Pre 24.6 %     GLOBAL Pre 21.88 %            Scores of 19 and below usually  indicate a poorer quality of life in these areas.  A difference of  2-3 points is a clinically meaningful difference.  A difference of 2-3 points in the total score of the Quality of Life Index has been associated with significant improvement in overall quality of life, self-image, physical symptoms, and general health in studies assessing change in quality of life.  PHQ-9: Recent Review Flowsheet Data     Depression screen [facility] 2/9 06/17/2021 04/29/2021 03/30/2021   Decreased Interest 1 0 0   Down, Depressed, Hopeless 0 0 0   PHQ - 2 Score 1 0 0   Altered sleeping 0 1 2    Tired, decreased energy 2 0 3   Change in appetite 0 0 1   Feeling bad or failure about yourself  0 0 0   Trouble concentrating _0 Moving slowly or fidgety/restless 0 0 2   Suicidal thoughts 0 0 0   PHQ-9 Score _1 Difficult doing work/chores Not difficult at all Not difficult at all Somewhat difficult      Interpretation of Total Score  Total Score Depression Severity:  1-4 = Minimal depression, 5-9 = Mild depression, 10-14 = Moderate depression, 15-19 = Moderately severe depression, 20-27 = Severe depression   Psychosocial Evaluation and Intervention:   Psychosocial Re-Evaluation:  Psychosocial Re-Evaluation     Row Name 03/23/21 1347 04/29/21 1118 05/04/21 1130 06/03/21 1113       Psychosocial Re-Evaluation   Current issues with None Identified None Identified None Identified None Identified    Comments Kaedin can look to his daughter that lives nearby and his wife for support. He states that his mental health is great. Reviewed patient health questionnaire (PHQ-9) with patient for follow up. Previously, patients score indicated signs/symptoms of depression.  Reviewed to see if patient is improving symptom wise while in program.  Score improved and patient states that it is because he has been able to exercise and have a little more  energy. Ebrima is doing very well mentally. He denies any problems, concerns, or issues. Sleep is good. He has good support from his wife- He does not take any medications for depression or anxiety. His breathing is better  as he feels like activites at home are easier to do than before starting the program. He is pleased thus far. Patient reports no issues with their current mental states, sleep, stress, depression or anxiety. Will follow up with patient in a few weeks for any changes.    Expected Outcomes Short: Exercise regularly to support mental health and notify staff of any changes. Long: maintain mental health and well being through teaching of rehab or prescribed medications independently. Short: Continue to attend [facility] regularly for regular exercise and social engagement. Long: Continue to improve symptoms and manage a positive mental state. Short: Continue attendance with rehab Long: Continue to maintain positive attitude and exercise for stress management Short: Continue to exercise regularly to support mental health and notify staff of any changes. Long: maintain mental health and well being through teaching of rehab or prescribed medications independently.    Interventions Encouraged to attend Pulmonary Rehabilitation for the exercise Encouraged to attend Pulmonary Rehabilitation for the exercise Encouraged to attend Pulmonary Rehabilitation for the exercise Encouraged to attend Pulmonary Rehabilitation for the exercise    Continue Psychosocial Services  Follow up required by staff Follow up required by staff -- Follow up required by staff  Psychosocial Discharge (Final Psychosocial Re-Evaluation):  Psychosocial Re-Evaluation - 06/03/21 1113       Psychosocial Re-Evaluation   Current issues with None Identified    Comments Patient reports no issues with their current mental states, sleep, stress, depression or anxiety. Will follow up with patient in a few weeks for any  changes.    Expected Outcomes Short: Continue to exercise regularly to support mental health and notify staff of any changes. Long: maintain mental health and well being through teaching of rehab or prescribed medications independently.    Interventions Encouraged to attend Pulmonary Rehabilitation for the exercise    Continue Psychosocial Services  Follow up required by staff             Education: Education Goals: Education classes will be provided on a weekly basis, covering required topics. Participant will state understanding/return demonstration of topics presented.  Learning Barriers/Preferences:  Learning Barriers/Preferences - 03/23/21 1344       Learning Barriers/Preferences   Learning Barriers None    Learning Preferences None             General Pulmonary Education Topics:  Infection Prevention: - Provides verbal and written material to individual with discussion of infection control including proper hand washing and proper equipment cleaning during exercise session. Flowsheet Row Pulmonary Rehab from 06/24/2021 in [facility] Cardiac and Pulmonary Rehab  Date 03/30/21  Educator AS  Instruction Review Code 1- Verbalizes Understanding       Falls Prevention: - Provides verbal and written material to individual with discussion of falls prevention and safety. Flowsheet Row Pulmonary Rehab from 06/24/2021 in [facility] Cardiac and Pulmonary Rehab  Date 03/30/21  Educator AS  Instruction Review Code 1- Verbalizes Understanding       Chronic Lung Disease Review: - Group verbal instruction with posters, models, PowerPoint presentations and videos,  to review new updates, new respiratory medications, new advancements in procedures and treatments. Providing information on websites and "800" numbers for continued self-education. Includes information about supplement oxygen, available portable oxygen systems, continuous and intermittent flow rates, oxygen safety, concentrators,  and Medicare reimbursement for oxygen. Explanation of Pulmonary Drugs, including class, frequency, complications, importance of spacers, rinsing mouth after steroid MDI's, and proper cleaning methods for nebulizers. Review of basic lung anatomy and physiology related to function, structure, and complications of lung disease. Review of risk factors. Discussion about methods for diagnosing sleep apnea and types of masks and machines for OSA. Includes a review of the use of types of environmental controls: home humidity, furnaces, filters, dust mite/pet prevention, HEPA vacuums. Discussion about weather changes, air quality and the benefits of nasal washing. Instruction on Warning signs, infection symptoms, calling MD promptly, preventive modes, and value of vaccinations. Review of effective airway clearance, coughing and/or vibration techniques. Emphasizing that all should Create an Action Plan. Written material given at graduation.   AED/CPR: - Group verbal and written instruction with the use of models to demonstrate the basic use of the AED with the basic ABC's of resuscitation.    Anatomy and Cardiac Procedures: - Group verbal and visual presentation and models provide information about basic cardiac anatomy and function. Reviews the testing methods done to diagnose heart disease and the outcomes of the test results. Describes the treatment choices: Medical Management, Angioplasty, or Coronary Bypass Surgery for treating various heart conditions including Myocardial Infarction, Angina, Valve Disease, and Cardiac Arrhythmias.  Written material given at graduation. Flowsheet Row Pulmonary Rehab from 06/24/2021 in [facility] Cardiac and Pulmonary Rehab  Date 04/08/21  Educator SB  Instruction Review Code 1- Verbalizes Understanding       Medication Safety: - Group verbal and visual instruction to review commonly prescribed medications for heart and lung disease. Reviews the medication, class of the drug,  and side effects. Includes the steps to properly store meds and maintain the prescription regimen.  Written material given at graduation. Flowsheet Row Pulmonary Rehab from 06/24/2021 in [facility] Cardiac and Pulmonary Rehab  Date 06/24/21  Educator SB  Instruction Review Code 1- Verbalizes Understanding       Other: -Provides group and verbal instruction on various topics (see comments)   Knowledge Questionnaire Score:  Knowledge Questionnaire Score - 06/17/21 1238       Knowledge Questionnaire Score   Post Score 16/18              Core Components/Risk Factors/Patient Goals at Admission:  Personal Goals and Risk Factors at Admission - 03/30/21 1614       Core Components/Risk Factors/Patient Goals on Admission    Weight Management Yes;Weight Maintenance;Weight Loss    Intervention Weight Management: Develop a combined nutrition and exercise program designed to reach desired caloric intake, while maintaining appropriate intake of nutrient and fiber, sodium and fats, and appropriate energy expenditure required for the weight goal.;Weight Management: Provide education and appropriate resources to help participant work on and attain dietary goals.;Weight Management/Obesity: Establish reasonable short term and long term weight goals.    Expected Outcomes Short Term: Continue to assess and modify interventions until short term weight is achieved;Long Term: Adherence to nutrition and physical activity/exercise program aimed toward attainment of established weight goal;Understanding recommendations for meals to include 15-35% energy as protein, 25-35% energy from fat, 35-60% energy from carbohydrates, less than 228m of dietary cholesterol, 20-35 gm of total fiber daily;Understanding of distribution of calorie intake throughout the day with the consumption of 4-5 meals/snacks;Weight Loss: Understanding of general recommendations for a balanced deficit meal plan, which promotes 1-2 lb weight loss  per week and includes a negative energy balance of [phone] kcal/d    Improve shortness of breath with ADL's Yes    Intervention Provide education, individualized exercise plan and daily activity instruction to help decrease symptoms of SOB with activities of daily living.    Expected Outcomes Short Term: Improve cardiorespiratory fitness to achieve a reduction of symptoms when performing ADLs;Long Term: Be able to perform more ADLs without symptoms or delay the onset of symptoms    Diabetes Yes    Intervention Provide education about signs/symptoms and action to take for hypo/hyperglycemia.;Provide education about proper nutrition, including hydration, and aerobic/resistive exercise prescription along with prescribed medications to achieve blood glucose in normal ranges: Fasting glucose 65-99 mg/dL    Expected Outcomes Short Term: Participant verbalizes understanding of the signs/symptoms and immediate care of hyper/hypoglycemia, proper foot care and importance of medication, aerobic/resistive exercise and nutrition plan for blood glucose control.;Long Term: Attainment of HbA1C < 7%.    Heart Failure Yes    Intervention Provide a combined exercise and nutrition program that is supplemented with education, support and counseling about heart failure. Directed toward relieving symptoms such as shortness of breath, decreased exercise tolerance, and extremity edema.    Expected Outcomes Improve functional capacity of life;Short term: Attendance in program 2-3 days a week with increased exercise capacity. Reported lower sodium intake. Reported increased fruit and vegetable intake. Reports medication compliance.;Short term: Daily weights obtained and reported for increase. Utilizing diuretic protocols set by physician.;Long term: Adoption of  self-care skills and reduction of barriers for early signs and symptoms recognition and intervention leading to self-care maintenance.    Hypertension Yes    Intervention  Provide education on lifestyle modifcations including regular physical activity/exercise, weight management, moderate sodium restriction and increased consumption of fresh fruit, vegetables, and low fat dairy, alcohol moderation, and smoking cessation.;Monitor prescription use compliance.    Expected Outcomes Short Term: Continued assessment and intervention until BP is < 140/45m HG in hypertensive participants. < 130/827mHG in hypertensive participants with diabetes, heart failure or chronic kidney disease.;Long Term: Maintenance of blood pressure at goal levels.    Lipids Yes    Intervention Provide education and support for participant on nutrition & aerobic/resistive exercise along with prescribed medications to achieve LDL <7011mHDL >54m59m  Expected Outcomes Short Term: Participant states understanding of desired cholesterol values and is compliant with medications prescribed. Participant is following exercise prescription and nutrition guidelines.;Long Term: Cholesterol controlled with medications as prescribed, with individualized exercise RX and with personalized nutrition plan. Value goals: LDL < 70mg46mL > 40 mg.             Education:Diabetes - Individual verbal and written instruction to review signs/symptoms of diabetes, desired ranges of glucose level fasting, after meals and with exercise. Acknowledge that pre and post exercise glucose checks will be done for 3 sessions at entry of program. Flowsheet Row [facility]  Date 03/23/21  Educator JH  I[facility] LLCtruction Review Code 1- Verbalizes Understanding       Know Your Numbers and Heart Failure: - Group verbal and visual instruction to discuss disease risk factors for cardiac and pulmonary disease and treatment options.  Reviews associated critical values for Overweight/Obesity, Hypertension, Cholesterol, and Diabetes.  Discusses basics of heart failure: signs/symptoms and  treatments.  Introduces Heart Failure Zone chart for action plan for heart failure.  Written material given at graduation. Flowsheet Row [facility]  Date 05/06/21  Educator MC  IK[facility]truction Review Code 1- Verbalizes Understanding       Core Components/Risk Factors/Patient Goals Review:   Goals and Risk Factor Review     Row Name 05/04/21 1124 06/03/21 1110           Core Components/Risk Factors/Patient Goals Review   Personal Goals Review Weight Management/Obesity;Hypertension;Heart Failure Improve shortness of breath with ADL's      Review DanieArberoing well. He is maintaining his weight around 207 lb, trying to maintain weight and not lose at this time. He weighs at home and knows to note any significant weight gain. Denies any symptons with heart failure. We went over a heart failure hand out and symptoms to look out for. BP has been stable as he checks at home, he only checks it periodically and if/when he feels symptomatic. Ranges [id number] and 60s f45Wdiastolic. Spoke to patient about their shortness of breath and what they can do to improve. Patient has been informed of breathing techniques when starting the program. Patient is informed to tell staff if they have had any med changes and that certain meds they are taking or not taking can be causing shortness of breath.      Expected Outcomes Short: Continue to monitor weight and heart failure symptoms Long: Continue to manage lifestyle risk factors Short: Attend [facility] regularly to improve shortness of breath with ADL's. Long: maintain independence with ADL's  Core Components/Risk Factors/Patient Goals at Discharge (Final Review):   Goals and Risk Factor Review - 06/03/21 1110       Core Components/Risk Factors/Patient Goals Review   Personal Goals Review Improve shortness of breath with ADL's    Review Spoke to patient about their shortness of  breath and what they can do to improve. Patient has been informed of breathing techniques when starting the program. Patient is informed to tell staff if they have had any med changes and that certain meds they are taking or not taking can be causing shortness of breath.    Expected Outcomes Short: Attend [facility] regularly to improve shortness of breath with ADL's. Long: maintain independence with ADL's             ITP Comments:  ITP Comments     Row Name 03/23/21 1348 03/30/21 1624 04/01/21 1103 04/22/21 0908 05/20/21 0733   ITP Comments Virtual Visit completed. Patient informed on EP and RD appointment and 6 Minute walk test. Patient also informed of patient health questionnaires on My Chart. Patient Verbalizes understanding. Visit diagnosis can be found in [facility] 12/02/20. Completed 6MWT and gym orientation. Initial ITP created and sent for review to Dr Ottie Glazier Medical Director. First full day of exercise!  Patient was oriented to gym and equipment including functions, settings, policies, and procedures.  Patient's individual exercise prescription and treatment plan were reviewed.  All starting workloads were established based on the results of the 6 minute walk test done at initial orientation visit.  The plan for exercise progression was also introduced and progression will be customized based on patient's performance and goals. 30 Day review completed. Medical Director ITP review done, changes made as directed, and signed approval by Medical Director. 30 Day review completed. Medical Director ITP review done, changes made as directed, and signed approval by Medical Director.    Cloud Name 06/17/21 [phone] 06/26/21 1106         ITP Comments 30 Day review completed. Medical Director ITP review done, changes made as directed, and signed approval by Medical Director. Wilho graduated today from  rehab with 36 sessions completed.  Details of the patient's exercise prescription and what He needs  to do in order to continue the prescription and progress were discussed with patient.  Patient was given a copy of prescription and goals.  Patient verbalized understanding.  Righteous plans to continue to exercise by attending the [facility].               Comments: Discharge ITP

## 2021-06-26 NOTE — Progress Notes (Signed)
Discharge Progress Report  Patient Details  Name: Jose Ray MRN: [id number] Date of Birth: [birth year] Referring Provider:   Flowsheet Row Pulmonary Rehab from [facility]/30/2021 [facility]  Referring Provider [name]        Number of Visits: 79  Reason for Discharge:  Patient reached a stable level of exercise. Patient independent in their exercise. Patient has met program and personal goals.  Smoking History:  Social History   Tobacco Use  Smoking Status Former   Packs/day: 1.00   Years: 35.00   Pack years: 35.00   Types: Cigarettes   Quit date: 1995   Years since quitting: 27.7  Smokeless Tobacco Never    Diagnosis:  Heart failure, chronic systolic (HCC)  ADL UCSD:  Pulmonary Assessment Scores     Row Name 03/30/21 1607 06/12/21 1123 06/17/21 1237     ADL UCSD   ADL Phase -- Exit --   SOB Score total 60 -- 68   Rest 1 -- 2   Walk 3 -- 2   Stairs 5 -- 5   Bath 2 -- 3   Dress 1 -- 2   Shop 2 -- 4     CAT Score   CAT Score 22 -- 22     mMRC Score   mMRC Score 2 2 --            Initial Exercise Prescription:  Initial Exercise Prescription - 03/30/21 1500       Date of Initial Exercise RX and Referring Provider   Date 03/30/21    Referring Provider [name]      Oxygen   Oxygen Continuous    Liters 3      Treadmill   MPH 1.3    Grade 0    Minutes 15    METs 2      Recumbant Bike   Level 1    RPM 60    Minutes 15    METs 2      NuStep   Level 1    SPM 80    Minutes 15    METs 2      REL-XR   Level 1    Speed 50    Minutes 15    METs 2      T5 Nustep   Level 1    SPM 50    Minutes 15    METs 2      Prescription Details   Frequency (times per week) 3    Duration Progress to 30 minutes of continuous aerobic without signs/symptoms of physical distress      Intensity   THRR 40-80% of Max Heartrate 113-132    Ratings of Perceived Exertion 11-13    Perceived Dyspnea 0-4      Progression    Progression Continue to progress workloads to maintain intensity without signs/symptoms of physical distress.      Resistance Training   Training Prescription Yes    Weight 3 lb    Reps 10-15             Discharge Exercise Prescription (Final Exercise Prescription Changes):  Exercise Prescription Changes - 06/22/21 0900       Response to Exercise   Blood Pressure (Admit) 122/60    Blood Pressure (Exit) 126/64    Heart Rate (Admit) 86 bpm    Heart Rate (Exercise) 100 bpm    Heart Rate (Exit) 92 bpm    Oxygen Saturation (Admit) 91 %  Oxygen Saturation (Exercise) 88 %    Oxygen Saturation (Exit) 93 %    Rating of Perceived Exertion (Exercise) 14    Perceived Dyspnea (Exercise) 3    Symptoms SOB    Duration Continue with 30 min of aerobic exercise without signs/symptoms of physical distress.    Intensity THRR unchanged      Progression   Progression Continue to progress workloads to maintain intensity without signs/symptoms of physical distress.    Average METs 2.15      Resistance Training   Training Prescription Yes    Weight 3 lb    Reps 10-15      Interval Training   Interval Training No      Oxygen   Oxygen Continuous    Liters 3      Treadmill   MPH 1.6    Grade 0    Minutes 15    METs 2.23      REL-XR   Level 2    Minutes 15      Home Exercise Plan   Plans to continue exercise at Home (comment)   Walking, wants to join [employer] Add 2 additional days to program exercise sessions.   Start with 1 day   Initial Home Exercises Provided 05/04/21             Functional Capacity:  6 Minute Walk     Row Name 03/30/21 1554 06/12/21 1121       6 Minute Walk   Phase Initial Discharge    Distance 925 feet 1120 feet    Distance % Change -- 21.1 %    Distance Feet Change -- 195 ft    Walk Time 6 minutes 6 minutes    # of Rest Breaks 0 0    MPH 1.75 2.12    METS 2 2.21    RPE 17 15    Perceived Dyspnea  3 3    VO2 Peak 7 7.75     Symptoms No Yes (comment)    Comments -- SOB    Resting HR 95 bpm 85 bpm    Resting BP 124/54 128/64    Resting Oxygen Saturation  88 % 95 %    Exercise Oxygen Saturation  during 6 min walk 84 % 82 %    Max Ex. HR 108 bpm 110 bpm    Max Ex. BP 160/64 148/74    2 Minute Post BP 126/60 130/70         Interval HR   1 Minute HR 98 102    2 Minute HR 97 105    3 Minute HR 98 104    4 Minute HR 98 106    5 Minute HR 105 110    6 Minute HR 108 108    2 Minute Post HR 98 92    Interval Heart Rate? Yes Yes         Interval Oxygen   Interval Oxygen? Yes Yes    Baseline Oxygen Saturation % 88 % 95 %    1 Minute Oxygen Saturation % 87 % 84 %    1 Minute Liters of Oxygen 3 L 3 L  pulsed concentrator    2 Minute Oxygen Saturation % -- 83 %    2 Minute Liters of Oxygen 3 L 0 L    3 Minute Oxygen Saturation % 88 % 83 %    3 Minute Liters of Oxygen 3 L 0 L  4 Minute Oxygen Saturation % 85 % 84 %    4 Minute Liters of Oxygen 3 L 0 L    5 Minute Oxygen Saturation % 85 % 83 %    5 Minute Liters of Oxygen 3 L 0 L    6 Minute Oxygen Saturation % 84 % 82 %    6 Minute Liters of Oxygen 3 L 0 L    2 Minute Post Oxygen Saturation % -- 90 %    2 Minute Post Liters of Oxygen 3 L 0 L             Psychological, QOL, Others - Outcomes: PHQ 2/9: Depression screen [facility] 2/9 06/17/2021 04/29/2021 03/30/2021  Decreased Interest 1 0 0  Down, Depressed, Hopeless 0 0 0  PHQ - 2 Score 1 0 0  Altered sleeping 0 1 2  Tired, decreased energy 2 0 3  Change in appetite 0 0 1  Feeling bad or failure about yourself  0 0 0  Trouble concentrating 2 3 2   Moving slowly or fidgety/restless 0 0 2  Suicidal thoughts 0 0 0  PHQ-9 Score 5 4 10   Difficult doing work/chores Not difficult at all Not difficult at all Somewhat difficult    Quality of Life:  Quality of Life - 03/30/21 1612       Quality of Life   Select Quality of Life      Quality of Life Scores   Health/Function Pre 21.07 %    Socioeconomic  Pre 22.71 %    Psych/Spiritual Pre 20.86 %    Family Pre 24.6 %    GLOBAL Pre 21.88 %             Nutrition & Weight - Outcomes:  Pre Biometrics - 03/31/21 1009       Pre Biometrics   Height 5' 11.5" (1.816 m)    Weight 213 lb 8 oz (96.8 kg)    BMI (Calculated) 29.37    Single Leg Stand 9.8 seconds             Post Biometrics - 06/12/21 1123        Post  Biometrics   Height 5' 11.5" (1.816 m)    Weight 220 lb 6.4 oz (100 kg)    BMI (Calculated) 30.31    Single Leg Stand 2.4 seconds             Nutrition:  Nutrition Therapy & Goals - 04/08/21 1248       Nutrition Therapy   Diet Heart healthy, low Na, diabetes friendly    Drug/Food Interactions Statins/Certain Fruits    Protein (specify units) 110-115g    Fiber 30 grams    Whole Grain Foods 3 servings    Saturated Fats 12 max. grams    Fruits and Vegetables 8 servings/day    Sodium 1.5 grams      Personal Nutrition Goals   Nutrition Goal ST: add snacks in between breakfast and dinner with protein and fat like fruit with nuts or bean salad LT: meet protein needs, eat consistent carbohydrates    Comments B: Toast (whole wheat) (2 slices with low fat cream cheese) S: round of cheese D: varies: vegetables and meat S: ice cream bar or sherbert. They just got a new monitor to see if it will work. A1C: a bit over 7 the last time he was here. They almost never eat fried food, they go out to eat maybe once per week. He will  [place] and he will does not typically use oil, but will put hamburger in baking dish and [place] dressing. He will also use pam to grease pan. He also does not cook with any salt. He will use truvia and less sugar when recipe calls for sugar. Discussed heart healthy eating and diabetes friendly eating.      Intervention Plan   Intervention Prescribe, educate and counsel regarding individualized specific dietary modifications aiming towards targeted core components such as weight, hypertension, lipid  management, diabetes, heart failure and other comorbidities.;Nutrition handout(s) given to patient.    Expected Outcomes Short Term Goal: Understand basic principles of dietary content, such as calories, fat, sodium, cholesterol and nutrients.;Short Term Goal: A plan has been developed with personal nutrition goals set during dietitian appointment.;Long Term Goal: Adherence to prescribed nutrition plan.            Education Questionnaire Score:  Knowledge Questionnaire Score - 06/17/21 1238       Knowledge Questionnaire Score   Post Score 16/18             Goals reviewed with patient; copy given to patient.

## 2021-06-26 NOTE — Progress Notes (Deleted)
Cardiac Individual Treatment Plan  Patient Details  Name: Jose Ray MRN: [id number] Date of Birth: 1941-05-29 Referring Provider:   Flowsheet Row Pulmonary Rehab from 03/30/2021 in [facility] Cardiac and Pulmonary Rehab  Referring Provider [name]       Initial Encounter Date:  Flowsheet Row Pulmonary Rehab from 03/30/2021 in [facility] Cardiac and Pulmonary Rehab  Date 03/30/21       Visit Diagnosis: Heart failure, chronic systolic ([place])  Patient's Home Medications on Admission:  Current Outpatient Medications:    aspirin 81 MG tablet, Take 81 mg by mouth at bedtime., Disp: , Rfl:    atorvastatin (LIPITOR) 80 MG tablet, Take 80 mg by mouth at bedtime. (Patient not taking: Reported on 03/23/2021), Disp: , Rfl:    atorvastatin (LIPITOR) 80 MG tablet, Take 1 tablet by mouth at bedtime., Disp: , Rfl:    famotidine (PEPCID) 10 MG tablet, Take 10 mg by mouth daily., Disp: , Rfl:    fexofenadine (ALLEGRA) 180 MG tablet, Take 180 mg by mouth at bedtime., Disp: , Rfl:    fluticasone (FLONASE) 50 MCG/ACT nasal spray, Place 2 sprays into both nostrils at bedtime., Disp: , Rfl:    fluticasone-salmeterol (ADVAIR) 250-50 MCG/ACT AEPB, Advair Diskus 250 mcg-50 mcg/dose powder for inhalation (Patient not taking: Reported on 03/23/2021), Disp: , Rfl:    Fluticasone-Salmeterol (ADVAIR) 250-50 MCG/DOSE AEPB, Inhale 1 puff into the lungs 2 (two) times daily., Disp: , Rfl:    glipiZIDE (GLUCOTROL) 10 MG tablet, Take 10 mg by mouth daily before breakfast., Disp: , Rfl:    hydrochlorothiazide (HYDRODIURIL) 25 MG tablet, Take 25 mg by mouth daily., Disp: , Rfl:    hydrochlorothiazide (HYDRODIURIL) 25 MG tablet, Take 1 tablet by mouth daily. (Patient not taking: Reported on 03/23/2021), Disp: , Rfl:    icosapent Ethyl (VASCEPA) 1 g capsule, Take 2 g by mouth 2 (two) times daily., Disp: , Rfl:    isosorbide mononitrate (IMDUR) 30 MG 24 hr tablet, Take 30 mg by mouth daily., Disp: , Rfl:    losartan (COZAAR) 50 MG  tablet, Take 100 mg by mouth daily., Disp: , Rfl:    metFORMIN (GLUCOPHAGE) 1000 MG tablet, Take 1,000 mg by mouth 2 (two) times daily., Disp: , Rfl:    metoprolol succinate (TOPROL-XL) 100 MG 24 hr tablet, Take 50 mg by mouth daily. Take with or immediately following a meal. (Patient not taking: Reported on 03/23/2021), Disp: , Rfl:    metoprolol succinate (TOPROL-XL) 25 MG 24 hr tablet, Take 1 tablet by mouth daily., Disp: , Rfl:    Multiple Vitamins-Minerals (PRESERVISION AREDS 2) CAPS, Take 1 capsule by mouth at bedtime. (Patient not taking: Reported on 03/23/2021), Disp: , Rfl:    OXYGEN, Inhale 2.5-3 L into the lungs See admin instructions. Used while sleeping and physical activity, Disp: , Rfl:    Polyethyl Glycol-Propyl Glycol 0.4-0.3 % SOLN, Place 1 drop into both eyes See admin instructions. Instill 1 drop into both eyes at night, may use during the day as needed for dry eyes, Disp: , Rfl:    tiotropium (SPIRIVA) 18 MCG inhalation capsule, Place 18 mcg into inhaler and inhale daily., Disp: , Rfl:   Past Medical History: Past Medical History:  Diagnosis Date   Acute MI ([place])    CHF (congestive heart failure) (HCC)    COPD (chronic obstructive pulmonary disease) (HCC)    Coronary artery disease    Diabetes mellitus without complication (HCC)    GERD (gastroesophageal reflux disease)  History of hiatal hernia    Hypertension     Tobacco Use: Social History   Tobacco Use  Smoking Status Former   Packs/day: 1.00   Years: 35.00   Pack years: 35.00   Types: Cigarettes   Quit date: 1995   Years since quitting: 27.7  Smokeless Tobacco Never    Labs: Recent Review Scientist, physiological     Labs for ITP Cardiac and Pulmonary Rehab Latest Ref Rng & Units 10/25/2016   Hemoglobin A1c 4.8 - 5.6 % 7.0(H)        Exercise Target Goals: Exercise Program Goal: Individual exercise prescription set using results from initial 6 min walk test and THRR while considering  patient's activity  barriers and safety.   Exercise Prescription Goal: Initial exercise prescription builds to 30-45 minutes a day of aerobic activity, 2-3 days per week.  Home exercise guidelines will be given to patient during program as part of exercise prescription that the participant will acknowledge.   Education: Aerobic Exercise: - Group verbal and visual presentation on the components of exercise prescription. Introduces F.I.T.T principle from ACSM for exercise prescriptions.  Reviews F.I.T.T. principles of aerobic exercise including progression. Written material given at graduation. Flowsheet Row Pulmonary Rehab from 06/24/2021 in [facility] Cardiac and Pulmonary Rehab  Date 06/03/21  Educator [facility]  Instruction Review Code 1- Verbalizes Understanding       Education: Resistance Exercise: - Group verbal and visual presentation on the components of exercise prescription. Introduces F.I.T.T principle from ACSM for exercise prescriptions  Reviews F.I.T.T. principles of resistance exercise including progression. Written material given at graduation. Flowsheet Row Pulmonary Rehab from 06/24/2021 in [facility] Cardiac and Pulmonary Rehab  Date 04/08/21  Educator [facility]  Instruction Review Code 1- Verbalizes Understanding        Education: Exercise & Equipment Safety: - Individual verbal instruction and demonstration of equipment use and safety with use of the equipment. Flowsheet Row Pulmonary Rehab from 06/24/2021 in [facility] Cardiac and Pulmonary Rehab  Date 03/30/21  Educator AS  Instruction Review Code 1- Verbalizes Understanding       Education: Exercise Physiology & General Exercise Guidelines: - Group verbal and written instruction with models to review the exercise physiology of the cardiovascular system and associated critical values. Provides general exercise guidelines with specific guidelines to those with heart or lung disease.  Flowsheet Row Pulmonary Rehab from 06/24/2021 in [facility] Cardiac and Pulmonary  Rehab  Date 05/27/21  Educator AS  Instruction Review Code 1- Verbalizes Understanding       Education: Flexibility, Balance, Mind/Body Relaxation: - Group verbal and visual presentation with interactive activity on the components of exercise prescription. Introduces F.I.T.T principle from ACSM for exercise prescriptions. Reviews F.I.T.T. principles of flexibility and balance exercise training including progression. Also discusses the mind body connection.  Reviews various relaxation techniques to help reduce and manage stress (i.e. Deep breathing, progressive muscle relaxation, and visualization). Balance handout provided to take home. Written material given at graduation. Flowsheet Row Pulmonary Rehab from 06/24/2021 in [facility] Cardiac and Pulmonary Rehab  Date 06/17/21  Educator [facility]  Instruction Review Code 1- Verbalizes Understanding       Activity Barriers & Risk Stratification:   6 Minute Walk:  Petal Name 03/30/21 1554 06/12/21 1121       6 Minute Walk   Phase Initial Discharge    Distance 925 feet 1120 feet    Distance % Change -- 21.1 %    Distance Feet  Change -- 195 ft    Walk Time 6 minutes 6 minutes    # of Rest Breaks 0 0    MPH 1.75 2.12    METS 2 2.21    RPE 17 15    Perceived Dyspnea  3 3    VO2 Peak 7 7.75    Symptoms No Yes (comment)    Comments -- SOB    Resting HR 95 bpm 85 bpm    Resting BP 124/54 128/64    Resting Oxygen Saturation  88 % 95 %    Exercise Oxygen Saturation  during 6 min walk 84 % 82 %    Max Ex. HR 108 bpm 110 bpm    Max Ex. BP 160/64 148/74    2 Minute Post BP 126/60 130/70         Interval HR   1 Minute HR 98 102    2 Minute HR 97 105    3 Minute HR 98 104    4 Minute HR 98 106    5 Minute HR 105 110    6 Minute HR 108 108    2 Minute Post HR 98 92    Interval Heart Rate? Yes Yes         Interval Oxygen   Interval Oxygen? Yes Yes    Baseline Oxygen Saturation % 88 % 95 %    1 Minute Oxygen Saturation %  87 % 84 %    1 Minute Liters of Oxygen 3 L 3 L  pulsed concentrator    2 Minute Oxygen Saturation % -- 83 %    2 Minute Liters of Oxygen 3 L 0 L    3 Minute Oxygen Saturation % 88 % 83 %    3 Minute Liters of Oxygen 3 L 0 L    4 Minute Oxygen Saturation % 85 % 84 %    4 Minute Liters of Oxygen 3 L 0 L    5 Minute Oxygen Saturation % 85 % 83 %    5 Minute Liters of Oxygen 3 L 0 L    6 Minute Oxygen Saturation % 84 % 82 %    6 Minute Liters of Oxygen 3 L 0 L    2 Minute Post Oxygen Saturation % -- 90 %    2 Minute Post Liters of Oxygen 3 L 0 L             Oxygen Initial Assessment:  Oxygen Initial Assessment - 03/23/21 1341       Home Oxygen   Home Oxygen Device Home Concentrator;Portable Concentrator;E-Tanks    Sleep Oxygen Prescription Continuous    Liters per minute 3    Home Exercise Oxygen Prescription Continuous    Liters per minute 3    Home Resting Oxygen Prescription None    Liters per minute 0    Compliance with Home Oxygen Use Yes      Initial 6 min Walk   Oxygen Used Continuous    Liters per minute 3      Program Oxygen Prescription   Program Oxygen Prescription Continuous    Liters per minute 3      Intervention   Short Term Goals To learn and exhibit compliance with exercise, home and travel O2 prescription;To learn and understand importance of monitoring SPO2 with pulse oximeter and demonstrate accurate use of the pulse oximeter.;To learn and understand importance of maintaining oxygen saturations>88%;To learn and demonstrate proper pursed lip breathing techniques  or other breathing techniques. ;To learn and demonstrate proper use of respiratory medications    Long  Term Goals Exhibits compliance with exercise, home  and travel O2 prescription;Verbalizes importance of monitoring SPO2 with pulse oximeter and return demonstration;Maintenance of O2 saturations>88%;Exhibits proper breathing techniques, such as pursed lip breathing or other method taught during  program session;Compliance with respiratory medication;Demonstrates proper use of MDI's             Oxygen Re-Evaluation:  Oxygen Re-Evaluation     Row Name 04/01/21 1104 06/03/21 1111           Program Oxygen Prescription   Program Oxygen Prescription Continuous Continuous      Liters per minute 3 3             Home Oxygen   Home Oxygen Device Home Concentrator;Portable Concentrator;E-Tanks Home Concentrator;Portable Concentrator;E-Tanks      Sleep Oxygen Prescription Continuous Continuous      Liters per minute 3 3      Home Exercise Oxygen Prescription Continuous Continuous      Liters per minute 3 3      Home Resting Oxygen Prescription None None      Liters per minute 0 0  prn for rest      Compliance with Home Oxygen Use Yes Yes             Goals/Expected Outcomes   Short Term Goals To learn and exhibit compliance with exercise, home and travel O2 prescription;To learn and understand importance of monitoring SPO2 with pulse oximeter and demonstrate accurate use of the pulse oximeter.;To learn and understand importance of maintaining oxygen saturations>88%;To learn and demonstrate proper pursed lip breathing techniques or other breathing techniques.  To learn and demonstrate proper pursed lip breathing techniques or other breathing techniques.       Long  Term Goals Exhibits compliance with exercise, home  and travel O2 prescription;Verbalizes importance of monitoring SPO2 with pulse oximeter and return demonstration;Maintenance of O2 saturations>88%;Exhibits proper breathing techniques, such as pursed lip breathing or other method taught during program session Exhibits proper breathing techniques, such as pursed lip breathing or other method taught during program session      Comments Reviewed PLB technique with pt.  Talked about how it works and it's importance in maintaining their exercise saturations. Diaphragmatic and PLB breathing explained and performed with patient.  Patient has a better understanding of how to do these exercises to help with breathing performance and relaxation. Patient performed breathing techniques adequately and to practice further at home.      Goals/Expected Outcomes Short: Become more profiecient at using PLB.   Long: Become independent at using PLB. Short: practice PLB and diaphragmatic breathing at home. Long: Use PLB and diaphragmatic breathing independently post [employer].               Oxygen Discharge (Final Oxygen Re-Evaluation):  Oxygen Re-Evaluation - 06/03/21 1111       Program Oxygen Prescription   Program Oxygen Prescription Continuous    Liters per minute 3      Home Oxygen   Home Oxygen Device Home Concentrator;Portable Concentrator;E-Tanks    Sleep Oxygen Prescription Continuous    Liters per minute 3    Home Exercise Oxygen Prescription Continuous    Liters per minute 3    Home Resting Oxygen Prescription None    Liters per minute 0   prn for rest   Compliance with Home Oxygen Use Yes  Goals/Expected Outcomes   Short Term Goals To learn and demonstrate proper pursed lip breathing techniques or other breathing techniques.     Long  Term Goals Exhibits proper breathing techniques, such as pursed lip breathing or other method taught during program session    Comments Diaphragmatic and PLB breathing explained and performed with patient. Patient has a better understanding of how to do these exercises to help with breathing performance and relaxation. Patient performed breathing techniques adequately and to practice further at home.    Goals/Expected Outcomes Short: practice PLB and diaphragmatic breathing at home. Long: Use PLB and diaphragmatic breathing independently post [employer].             Initial Exercise Prescription:  Initial Exercise Prescription - 03/30/21 1500       Date of Initial Exercise RX and Referring Provider   Date 03/30/21    Referring Provider [name]      Oxygen    Oxygen Continuous    Liters 3      Treadmill   MPH 1.3    Grade 0    Minutes 15    METs 2      Recumbant Bike   Level 1    RPM 60    Minutes 15    METs 2      NuStep   Level 1    SPM 80    Minutes 15    METs 2      REL-XR   Level 1    Speed 50    Minutes 15    METs 2      T5 Nustep   Level 1    SPM 50    Minutes 15    METs 2      Prescription Details   Frequency (times per week) 3    Duration Progress to 30 minutes of continuous aerobic without signs/symptoms of physical distress      Intensity   THRR 40-80% of Max Heartrate 113-132    Ratings of Perceived Exertion 11-13    Perceived Dyspnea 0-4      Progression   Progression Continue to progress workloads to maintain intensity without signs/symptoms of physical distress.      Resistance Training   Training Prescription Yes    Weight 3 lb    Reps 10-15             Perform Capillary Blood Glucose checks as needed.  Exercise Prescription Changes:   Exercise Prescription Changes     Row Name 04/16/21 0900 04/29/21 1500 05/04/21 1100 05/13/21 1100 05/28/21 0800     Response to Exercise   Blood Pressure (Admit) 132/68 122/64 -- 116/62 --   Blood Pressure (Exercise) 128/64 -- -- 152/64 --   Blood Pressure (Exit) 102/60 112/60 -- 114/60 102/62   Heart Rate (Admit) 75 bpm 88 bpm -- 80 bpm 88 bpm   Heart Rate (Exercise) 98 bpm 95 bpm -- 103 bpm 92 bpm   Heart Rate (Exit) 93 bpm 88 bpm -- 93 bpm 89 bpm   Oxygen Saturation (Admit) 92 % 97 % -- 90 % 92 %   Oxygen Saturation (Exercise) 90 % 88 % -- 89 % 90 %   Oxygen Saturation (Exit) 93 % 90 % -- 90 % 91 %   Rating of Perceived Exertion (Exercise) 13 12 -- 13 14   Perceived Dyspnea (Exercise) 1 2 -- 1 3   Symptoms none SOB -- -- SOB   Comments first full week  of exercise -- -- -- --   Duration Progress to 30 minutes of  aerobic without signs/symptoms of physical distress Continue with 30 min of aerobic exercise without signs/symptoms of physical  distress. -- Continue with 30 min of aerobic exercise without signs/symptoms of physical distress. Continue with 30 min of aerobic exercise without signs/symptoms of physical distress.   Intensity THRR unchanged THRR unchanged -- THRR unchanged THRR unchanged     Progression   Progression Continue to progress workloads to maintain intensity without signs/symptoms of physical distress. Continue to progress workloads to maintain intensity without signs/symptoms of physical distress. -- Continue to progress workloads to maintain intensity without signs/symptoms of physical distress. Continue to progress workloads to maintain intensity without signs/symptoms of physical distress.   Average METs 2.06 2.38 -- 2 2.45     Resistance Training   Training Prescription Yes Yes -- Yes Yes   Weight 5 lb 3 lb -- 3 lb 3 lb   Reps 10-15 10-15 -- 10-15 10-15     Interval Training   Interval Training -- No -- No No     Oxygen   Oxygen Continuous Continuous -- Continuous Continuous   Liters 3 3 -- 3 3     Treadmill   MPH 1.3 1.3 -- -- 1.6   Grade 0 0 -- -- 0   Minutes 15 15 -- -- 15   METs 2 2 -- -- 2.23     Recumbant Bike   Level 1 1 -- 1 2.6   Minutes 18 18 -- 15 15   METs 2.82 2.63 -- -- 2.81     REL-XR   Level -- 2 -- -- 2   Minutes -- 15 -- -- 15   METs -- 3 -- -- 2.23     T5 Nustep   Level 3 2 -- 3 3   Minutes 15 15 -- 15 15   METs 1.9 1.9 -- 2 1.9     Home Exercise Plan   Plans to continue exercise at -- -- Home (comment)  Walking, wants to join [employer] (comment)  Walking, wants to join [employer] (comment)  Walking, wants to join [employer] -- -- Add 2 additional days to program exercise sessions.  Start with 1 day Add 2 additional days to program exercise sessions.  Start with 1 day Add 2 additional days to program exercise sessions.  Start with 1 day   Initial Home Exercises Provided -- -- 05/04/21 05/04/21 05/04/21    Row Name 06/09/21 1300 06/22/21 0900            Response to Exercise   Blood Pressure (Admit) 116/62 122/60      Blood Pressure (Exit) 110/58 126/64      Heart Rate (Admit) 84 bpm 86 bpm      Heart Rate (Exercise) 87 bpm 100 bpm      Heart Rate (Exit) 82 bpm 92 bpm      Oxygen Saturation (Admit) 93 % 91 %      Oxygen Saturation (Exercise) 90 % 88 %      Oxygen Saturation (Exit) 93 % 93 %      Rating of Perceived Exertion (Exercise) 13 14      Perceived Dyspnea (Exercise) 3 3      Symptoms -- SOB      Duration Continue with 30 min of aerobic exercise without signs/symptoms of physical distress. Continue with 30 min of aerobic exercise without signs/symptoms of physical  distress.      Intensity THRR unchanged THRR unchanged             Progression   Progression Continue to progress workloads to maintain intensity without signs/symptoms of physical distress. Continue to progress workloads to maintain intensity without signs/symptoms of physical distress.      Average METs 2.8 2.15             Resistance Training   Training Prescription Yes Yes      Weight 3 lb 3 lb      Reps 10-15 10-15             Interval Training   Interval Training No No             Oxygen   Oxygen Continuous Continuous      Liters 3 3             Treadmill   MPH 1.6 1.6      Grade 0 0      Minutes 15 15      METs 2.23 2.23             REL-XR   Level 2 2      Minutes 15 15      METs 3.3 --             Home Exercise Plan   Plans to continue exercise at Home (comment)  Walking, wants to join [facility] (comment)  Walking, wants to join [employer] Add 2 additional days to program exercise sessions.  Start with 1 day Add 2 additional days to program exercise sessions.  Start with 1 day      Initial Home Exercises Provided 05/04/21 05/04/21               Exercise Comments:   Exercise Comments     Row Name 04/01/21 1103           Exercise Comments First full day of exercise!  Patient was oriented to gym and  equipment including functions, settings, policies, and procedures.  Patient's individual exercise prescription and treatment plan were reviewed.  All starting workloads were established based on the results of the 6 minute walk test done at initial orientation visit.  The plan for exercise progression was also introduced and progression will be customized based on patient's performance and goals.                Exercise Goals and Review:   Exercise Goals     Row Name 03/30/21 1617             Exercise Goals   Increase Physical Activity Yes       Intervention Provide advice, education, support and counseling about physical activity/exercise needs.;Develop an individualized exercise prescription for aerobic and resistive training based on initial evaluation findings, risk stratification, comorbidities and participant's personal goals.       Expected Outcomes Short Term: Attend rehab on a regular basis to increase amount of physical activity.;Long Term: Add in home exercise to make exercise part of routine and to increase amount of physical activity.;Long Term: Exercising regularly at least 3-5 days a week.       Increase Strength and Stamina Yes       Intervention Provide advice, education, support and counseling about physical activity/exercise needs.;Develop an individualized exercise prescription for aerobic and resistive training based on initial evaluation findings, risk stratification, comorbidities and participant's personal goals.  Expected Outcomes Short Term: Increase workloads from initial exercise prescription for resistance, speed, and METs.;Short Term: Perform resistance training exercises routinely during rehab and add in resistance training at home;Long Term: Improve cardiorespiratory fitness, muscular endurance and strength as measured by increased METs and functional capacity (6MWT)       Able to understand and use rate of perceived exertion (RPE) scale Yes        Intervention Provide education and explanation on how to use RPE scale       Expected Outcomes Short Term: Able to use RPE daily in rehab to express subjective intensity level;Long Term:  Able to use RPE to guide intensity level when exercising independently       Able to understand and use Dyspnea scale Yes       Intervention Provide education and explanation on how to use Dyspnea scale       Expected Outcomes Short Term: Able to use Dyspnea scale daily in rehab to express subjective sense of shortness of breath during exertion;Long Term: Able to use Dyspnea scale to guide intensity level when exercising independently       Knowledge and understanding of Target Heart Rate Range (THRR) Yes       Intervention Provide education and explanation of THRR including how the numbers were predicted and where they are located for reference       Expected Outcomes Short Term: Able to state/look up THRR;Short Term: Able to use daily as guideline for intensity in rehab;Long Term: Able to use THRR to govern intensity when exercising independently       Able to check pulse independently Yes       Intervention Provide education and demonstration on how to check pulse in carotid and radial arteries.;Review the importance of being able to check your own pulse for safety during independent exercise       Expected Outcomes Short Term: Able to explain why pulse checking is important during independent exercise;Long Term: Able to check pulse independently and accurately       Understanding of Exercise Prescription Yes       Intervention Provide education, explanation, and written materials on patient's individual exercise prescription       Expected Outcomes Short Term: Able to explain program exercise prescription;Long Term: Able to explain home exercise prescription to exercise independently                Exercise Goals Re-Evaluation :  Exercise Goals Re-Evaluation     Row Name 04/01/21 1104 04/16/21 0919  04/29/21 1538 05/04/21 1147 05/13/21 1117     Exercise Goal Re-Evaluation   Exercise Goals Review Increase Physical Activity;Able to understand and use rate of perceived exertion (RPE) scale;Knowledge and understanding of Target Heart Rate Range (THRR);Understanding of Exercise Prescription;Increase Strength and Stamina;Able to understand and use Dyspnea scale;Able to check pulse independently Increase Physical Activity;Increase Strength and Stamina;Understanding of Exercise Prescription Increase Physical Activity;Increase Strength and Stamina;Understanding of Exercise Prescription Increase Physical Activity;Increase Strength and Stamina;Understanding of Exercise Prescription Increase Physical Activity;Increase Strength and Stamina   Comments Reviewed RPE and dyspnea scales, THR and program prescription with pt today.  Pt voiced understanding and was given a copy of goals to take home. Liandro is off to a good start as last week was his first full week of exercise. He has already increased to level 3 on the T5 Nustep and 5 lbs for hand weights. Will continue to monitor progress. Linna Hoff is doing well in rehab.  He is  up to 18 watts on the bike.  We will continue to monitor his progress. Reviewed home exercise with pt today.  Pt plans to walk for exercise. Patient is thinking about joining the [employer] and was provided a brochure. He has a recumbant bike at home that he may try to make work again as it hasn't worked for a while.  Reviewed THR, pulse, RPE, sign and symptoms, pulse oximetery and when to call 911 or MD.  Also discussed weather considerations and indoor options.  Pt voiced understanding. Jesusita Oka is doing well.  His oxygen stays above 88% during exercise sessions.  He has increased level on T5.  Staff wil encourage increasing weight for strength work   Expected Outcomes Short: Use RPE daily to regulate intensity. Long: Follow program prescription in THR. Short: Continue to build up speed on TM Long: Continue  to increase overall strength and stamina Short: Move back up to level 3 on the T5  Long: Continue to improve stamina Short: Add on 1 day of exercise at home: Continue to exercise independently at home at appropriate prescription Short: maintain consistent attendance Long: continue to build stamina    Row Name 05/28/21 0842 06/09/21 1355 06/22/21 0940         Exercise Goal Re-Evaluation   Exercise Goals Review Increase Physical Activity;Increase Strength and Stamina Increase Physical Activity;Increase Strength and Stamina Increase Physical Activity;Increase Strength and Stamina     Comments Krikor is doing well in rehab. He did increase his treadmill speed to 1.6. Unfortunately, he is not quite hitting his THRZ during exercise and will encourage to meet that standard as best as possible. Will continue to monitor. Dan continue sto do well. he attends consistently and oxygen stays in the 90s.  Staff will encourage him to try 4 lb for strength work. Jesusita Oka is keeping up with his exercise prescription. He completed his post and improved by 21%!! Dan should be encouraged to increase his handweights to 4 lbs. Will continue to monitor until graduation.     Expected Outcomes Short: Continue to increase loads on machines Long: Continue to increase overall MET level Short: try 4 ln Long:  build overall stamina Short: Graduate Long: Continue to increase and maintain strength/stamina post graduation              Discharge Exercise Prescription (Final Exercise Prescription Changes):  Exercise Prescription Changes - 06/22/21 0900       Response to Exercise   Blood Pressure (Admit) 122/60    Blood Pressure (Exit) 126/64    Heart Rate (Admit) 86 bpm    Heart Rate (Exercise) 100 bpm    Heart Rate (Exit) 92 bpm    Oxygen Saturation (Admit) 91 %    Oxygen Saturation (Exercise) 88 %    Oxygen Saturation (Exit) 93 %    Rating of Perceived Exertion (Exercise) 14    Perceived Dyspnea (Exercise) 3     Symptoms SOB    Duration Continue with 30 min of aerobic exercise without signs/symptoms of physical distress.    Intensity THRR unchanged      Progression   Progression Continue to progress workloads to maintain intensity without signs/symptoms of physical distress.    Average METs 2.15      Resistance Training   Training Prescription Yes    Weight 3 lb    Reps 10-15      Interval Training   Interval Training No      Oxygen   Oxygen Continuous  Liters 3      Treadmill   MPH 1.6    Grade 0    Minutes 15    METs 2.23      REL-XR   Level 2    Minutes 15      Home Exercise Plan   Plans to continue exercise at Home (comment)   Walking, wants to join [employer] Add 2 additional days to program exercise sessions.   Start with 1 day   Initial Home Exercises Provided 05/04/21             Nutrition:  Target Goals: Understanding of nutrition guidelines, daily intake of sodium '1500mg'$ , cholesterol '200mg'$ , calories 30% from fat and 7% or less from saturated fats, daily to have 5 or more servings of fruits and vegetables.  Education: All About Nutrition: -Group instruction provided by verbal, written material, interactive activities, discussions, models, and posters to present general guidelines for heart healthy nutrition including fat, fiber, MyPlate, the role of sodium in heart healthy nutrition, utilization of the nutrition label, and utilization of this knowledge for meal planning. Follow up email sent as well. Written material given at graduation. Flowsheet Row Pulmonary Rehab from 06/24/2021 in [facility] Cardiac and Pulmonary Rehab  Date 04/22/21  Educator [facility]  Instruction Review Code 1- Verbalizes Understanding       Biometrics:  Pre Biometrics - 03/31/21 1009       Pre Biometrics   Height 5' 11.5" (1.816 m)    Weight 213 lb 8 oz (96.8 kg)    BMI (Calculated) 29.37    Single Leg Stand 9.8 seconds             Post Biometrics - 06/12/21 1123         Post  Biometrics   Height 5' 11.5" (1.816 m)    Weight 220 lb 6.4 oz (100 kg)    BMI (Calculated) 30.31    Single Leg Stand 2.4 seconds             Nutrition Therapy Plan and Nutrition Goals:  Nutrition Therapy & Goals - 04/08/21 1248       Nutrition Therapy   Diet Heart healthy, low Na, diabetes friendly    Drug/Food Interactions Statins/Certain Fruits    Protein (specify units) 110-115g    Fiber 30 grams    Whole Grain Foods 3 servings    Saturated Fats 12 max. grams    Fruits and Vegetables 8 servings/day    Sodium 1.5 grams      Personal Nutrition Goals   Nutrition Goal ST: add snacks in between breakfast and dinner with protein and fat like fruit with nuts or bean salad LT: meet protein needs, eat consistent carbohydrates    Comments B: Toast (whole wheat) (2 slices with low fat cream cheese) S: round of cheese D: varies: vegetables and meat S: ice cream bar or sherbert. They just got a new monitor to see if it will work. A1C: a bit over 7 the last time he was here. They almost never eat fried food, they go out to eat maybe once per week. He will coook and he will does not typically use oil, but will put hamburger in baking dish and [place] dressing. He will also use pam to grease pan. He also does not cook with any salt. He will use truvia and less sugar when recipe calls for sugar. Discussed heart healthy eating and diabetes friendly eating.      Intervention Plan  Intervention Prescribe, educate and counsel regarding individualized specific dietary modifications aiming towards targeted core components such as weight, hypertension, lipid management, diabetes, heart failure and other comorbidities.;Nutrition handout(s) given to patient.    Expected Outcomes Short Term Goal: Understand basic principles of dietary content, such as calories, fat, sodium, cholesterol and nutrients.;Short Term Goal: A plan has been developed with personal nutrition goals set during dietitian  appointment.;Long Term Goal: Adherence to prescribed nutrition plan.             Nutrition Assessments:  MEDIFICTS Score Key: ?70 Need to make dietary changes  40-70 Heart Healthy Diet ? 40 Therapeutic Level Cholesterol Diet  Flowsheet Row Pulmonary Rehab from 06/17/2021 in [facility] Cardiac and Pulmonary Rehab  Picture Your Plate Total Score on Discharge 75      Picture Your Plate Scores: <54 Unhealthy dietary pattern with much room for improvement. 41-50 Dietary pattern unlikely to meet recommendations for good health and room for improvement. 51-60 More healthful dietary pattern, with some room for improvement.  >60 Healthy dietary pattern, although there may be some specific behaviors that could be improved.    Nutrition Goals Re-Evaluation:  Nutrition Goals Re-Evaluation     Birney Name 06/03/21 1114             Goals   Nutrition Goal Eat a lower fat diet       Comment Linna Hoff states that he eats a lower fat diet and has 2 vegetables in every meal except breakfast. He would like to drop some weight.       Expected Outcome Short: lost some weight. Long: reach a weight goal of less than 200.                Nutrition Goals Discharge (Final Nutrition Goals Re-Evaluation):  Nutrition Goals Re-Evaluation - 06/03/21 1114       Goals   Nutrition Goal Eat a lower fat diet    Comment Linna Hoff states that he eats a lower fat diet and has 2 vegetables in every meal except breakfast. He would like to drop some weight.    Expected Outcome Short: lost some weight. Long: reach a weight goal of less than 200.             Psychosocial: Target Goals: Acknowledge presence or absence of significant depression and/or stress, maximize coping skills, provide positive support system. Participant is able to verbalize types and ability to use techniques and skills needed for reducing stress and depression.   Education: Stress, Anxiety, and Depression - Group verbal and visual presentation to  define topics covered.  Reviews how body is impacted by stress, anxiety, and depression.  Also discusses healthy ways to reduce stress and to treat/manage anxiety and depression.  Written material given at graduation. Flowsheet Row Pulmonary Rehab from 06/24/2021 in [facility] Cardiac and Pulmonary Rehab  Date 05/20/21  Educator [facility]  Instruction Review Code 1- Verbalizes Understanding       Education: Sleep Hygiene -Provides group verbal and written instruction about how sleep can affect your health.  Define sleep hygiene, discuss sleep cycles and impact of sleep habits. Review good sleep hygiene tips.    Initial Review & Psychosocial Screening:  Initial Psych Review & Screening - 03/23/21 1346       Initial Review   Current issues with None Identified      Family Dynamics   Good Support System? Yes    Comments Deago can look to his daughter that lives nearby and his wife  for support. He states that his mental health is great.      Barriers   Psychosocial barriers to participate in program The patient should benefit from training in stress management and relaxation.;There are no identifiable barriers or psychosocial needs.      Screening Interventions   Interventions To provide support and resources with identified psychosocial needs;Provide feedback about the scores to participant;Encouraged to exercise    Expected Outcomes Short Term goal: Utilizing psychosocial counselor, staff and physician to assist with identification of specific Stressors or current issues interfering with healing process. Setting desired goal for each stressor or current issue identified.;Long Term Goal: Stressors or current issues are controlled or eliminated.;Short Term goal: Identification and review with participant of any Quality of Life or Depression concerns found by scoring the questionnaire.;Long Term goal: The participant improves quality of Life and PHQ9 Scores as seen by post scores and/or verbalization of  changes             Quality of Life Scores:   Quality of Life - 03/30/21 1612       Quality of Life   Select Quality of Life      Quality of Life Scores   Health/Function Pre 21.07 %    Socioeconomic Pre 22.71 %    Psych/Spiritual Pre 20.86 %    Family Pre 24.6 %    GLOBAL Pre 21.88 %            Scores of 19 and below usually indicate a poorer quality of life in these areas.  A difference of  2-3 points is a clinically meaningful difference.  A difference of 2-3 points in the total score of the Quality of Life Index has been associated with significant improvement in overall quality of life, self-image, physical symptoms, and general health in studies assessing change in quality of life.  PHQ-9: Recent Review Flowsheet Data     Depression screen [facility] 2/9 06/17/2021 04/29/2021 03/30/2021   Decreased Interest 1 0 0   Down, Depressed, Hopeless 0 0 0   PHQ - 2 Score 1 0 0   Altered sleeping 0 1 2    Tired, decreased energy 2 0 3   Change in appetite 0 0 1   Feeling bad or failure about yourself  0 0 0   Trouble concentrating 2 3 2    Moving slowly or fidgety/restless 0 0 2   Suicidal thoughts 0 0 0   PHQ-9 Score 5 4 10    Difficult doing work/chores Not difficult at all Not difficult at all Somewhat difficult      Interpretation of Total Score  Total Score Depression Severity:  1-4 = Minimal depression, 5-9 = Mild depression, 10-14 = Moderate depression, 15-19 = Moderately severe depression, 20-27 = Severe depression   Psychosocial Evaluation and Intervention:   Psychosocial Re-Evaluation:  Psychosocial Re-Evaluation     Row Name 03/23/21 1347 04/29/21 1118 05/04/21 1130 06/03/21 1113       Psychosocial Re-Evaluation   Current issues with None Identified None Identified None Identified None Identified    Comments Jahseh can look to his daughter that lives nearby and his wife for support. He states that his mental health is great. Reviewed patient health  questionnaire (PHQ-9) with patient for follow up. Previously, patients score indicated signs/symptoms of depression.  Reviewed to see if patient is improving symptom wise while in program.  Score improved and patient states that it is because he has been able to exercise and have  a little more energy. Moua is doing very well mentally. He denies any problems, concerns, or issues. Sleep is good. He has good support from his wife- He does not take any medications for depression or anxiety. His breathing is better  as he feels like activites at home are easier to do than before starting the program. He is pleased thus far. Patient reports no issues with their current mental states, sleep, stress, depression or anxiety. Will follow up with patient in a few weeks for any changes.    Expected Outcomes Short: Exercise regularly to support mental health and notify staff of any changes. Long: maintain mental health and well being through teaching of rehab or prescribed medications independently. Short: Continue to attend [facility] regularly for regular exercise and social engagement. Long: Continue to improve symptoms and manage a positive mental state. Short: Continue attendance with rehab Long: Continue to maintain positive attitude and exercise for stress management Short: Continue to exercise regularly to support mental health and notify staff of any changes. Long: maintain mental health and well being through teaching of rehab or prescribed medications independently.    Interventions Encouraged to attend Pulmonary Rehabilitation for the exercise Encouraged to attend Pulmonary Rehabilitation for the exercise Encouraged to attend Pulmonary Rehabilitation for the exercise Encouraged to attend Pulmonary Rehabilitation for the exercise    Continue Psychosocial Services  Follow up required by staff Follow up required by staff -- Follow up required by staff             Psychosocial Discharge (Final Psychosocial  Re-Evaluation):  Psychosocial Re-Evaluation - 06/03/21 1113       Psychosocial Re-Evaluation   Current issues with None Identified    Comments Patient reports no issues with their current mental states, sleep, stress, depression or anxiety. Will follow up with patient in a few weeks for any changes.    Expected Outcomes Short: Continue to exercise regularly to support mental health and notify staff of any changes. Long: maintain mental health and well being through teaching of rehab or prescribed medications independently.    Interventions Encouraged to attend Pulmonary Rehabilitation for the exercise    Continue Psychosocial Services  Follow up required by staff             Vocational Rehabilitation: Provide vocational rehab assistance to qualifying candidates.   Vocational Rehab Evaluation & Intervention:   Education: Education Goals: Education classes will be provided on a variety of topics geared toward better understanding of heart health and risk factor modification. Participant will state understanding/return demonstration of topics presented as noted by education test scores.  Learning Barriers/Preferences:  Learning Barriers/Preferences - 03/23/21 1344       Learning Barriers/Preferences   Learning Barriers None    Learning Preferences None             General Cardiac Education Topics:  AED/CPR: - Group verbal and written instruction with the use of models to demonstrate the basic use of the AED with the basic ABC's of resuscitation.   Anatomy and Cardiac Procedures: - Group verbal and visual presentation and models provide information about basic cardiac anatomy and function. Reviews the testing methods done to diagnose heart disease and the outcomes of the test results. Describes the treatment choices: Medical Management, Angioplasty, or Coronary Bypass Surgery for treating various heart conditions including Myocardial Infarction, Angina, Valve Disease, and  Cardiac Arrhythmias.  Written material given at graduation. Flowsheet Row Pulmonary Rehab from 06/24/2021 in [facility] Cardiac and Pulmonary Rehab  Date 04/08/21  Educator SB  Instruction Review Code 1- Verbalizes Understanding       Medication Safety: - Group verbal and visual instruction to review commonly prescribed medications for heart and lung disease. Reviews the medication, class of the drug, and side effects. Includes the steps to properly store meds and maintain the prescription regimen.  Written material given at graduation. Flowsheet Row Pulmonary Rehab from 06/24/2021 in [facility] Cardiac and Pulmonary Rehab  Date 06/24/21  Educator SB  Instruction Review Code 1- Verbalizes Understanding       Intimacy: - Group verbal instruction through game format to discuss how heart and lung disease can affect sexual intimacy. Written material given at graduation.Arn Medal Row Pulmonary Rehab from 06/24/2021 in [facility] Cardiac and Pulmonary Rehab  Date 06/03/21  Educator [facility]  Instruction Review Code 1- Verbalizes Understanding       Know Your Numbers and Heart Failure: - Group verbal and visual instruction to discuss disease risk factors for cardiac and pulmonary disease and treatment options.  Reviews associated critical values for Overweight/Obesity, Hypertension, Cholesterol, and Diabetes.  Discusses basics of heart failure: signs/symptoms and treatments.  Introduces Heart Failure Zone chart for action plan for heart failure.  Written material given at graduation. Flowsheet Row Pulmonary Rehab from 06/24/2021 in [facility] Cardiac and Pulmonary Rehab  Date 05/06/21  Educator [facility]  Instruction Review Code 1- Verbalizes Understanding       Infection Prevention: - Provides verbal and written material to individual with discussion of infection control including proper hand washing and proper equipment cleaning during exercise session. Flowsheet Row Pulmonary Rehab from 06/24/2021 in [facility] Cardiac and  Pulmonary Rehab  Date 03/30/21  Educator AS  Instruction Review Code 1- Verbalizes Understanding       Falls Prevention: - Provides verbal and written material to individual with discussion of falls prevention and safety. Flowsheet Row Pulmonary Rehab from 06/24/2021 in [facility] Cardiac and Pulmonary Rehab  Date 03/30/21  Educator AS  Instruction Review Code 1- Verbalizes Understanding       Other: -Provides group and verbal instruction on various topics (see comments)   Knowledge Questionnaire Score:  Knowledge Questionnaire Score - 06/17/21 1238       Knowledge Questionnaire Score   Post Score 16/18             Core Components/Risk Factors/Patient Goals at Admission:  Personal Goals and Risk Factors at Admission - 03/30/21 1614       Core Components/Risk Factors/Patient Goals on Admission    Weight Management Yes;Weight Maintenance;Weight Loss    Intervention Weight Management: Develop a combined nutrition and exercise program designed to reach desired caloric intake, while maintaining appropriate intake of nutrient and fiber, sodium and fats, and appropriate energy expenditure required for the weight goal.;Weight Management: Provide education and appropriate resources to help participant work on and attain dietary goals.;Weight Management/Obesity: Establish reasonable short term and long term weight goals.    Expected Outcomes Short Term: Continue to assess and modify interventions until short term weight is achieved;Long Term: Adherence to nutrition and physical activity/exercise program aimed toward attainment of established weight goal;Understanding recommendations for meals to include 15-35% energy as protein, 25-35% energy from fat, 35-60% energy from carbohydrates, less than $RemoveB'200mg'ivRWldcm$  of dietary cholesterol, 20-35 gm of total fiber daily;Understanding of distribution of calorie intake throughout the day with the consumption of 4-5 meals/snacks;Weight Loss: Understanding of  general recommendations for a balanced deficit meal plan, which promotes 1-2 lb weight loss per week and includes a  negative energy balance of [phone] kcal/d    Improve shortness of breath with ADL's Yes    Intervention Provide education, individualized exercise plan and daily activity instruction to help decrease symptoms of SOB with activities of daily living.    Expected Outcomes Short Term: Improve cardiorespiratory fitness to achieve a reduction of symptoms when performing ADLs;Long Term: Be able to perform more ADLs without symptoms or delay the onset of symptoms    Diabetes Yes    Intervention Provide education about signs/symptoms and action to take for hypo/hyperglycemia.;Provide education about proper nutrition, including hydration, and aerobic/resistive exercise prescription along with prescribed medications to achieve blood glucose in normal ranges: Fasting glucose 65-99 mg/dL    Expected Outcomes Short Term: Participant verbalizes understanding of the signs/symptoms and immediate care of hyper/hypoglycemia, proper foot care and importance of medication, aerobic/resistive exercise and nutrition plan for blood glucose control.;Long Term: Attainment of HbA1C < 7%.    Heart Failure Yes    Intervention Provide a combined exercise and nutrition program that is supplemented with education, support and counseling about heart failure. Directed toward relieving symptoms such as shortness of breath, decreased exercise tolerance, and extremity edema.    Expected Outcomes Improve functional capacity of life;Short term: Attendance in program 2-3 days a week with increased exercise capacity. Reported lower sodium intake. Reported increased fruit and vegetable intake. Reports medication compliance.;Short term: Daily weights obtained and reported for increase. Utilizing diuretic protocols set by physician.;Long term: Adoption of self-care skills and reduction of barriers for early signs and symptoms  recognition and intervention leading to self-care maintenance.    Hypertension Yes    Intervention Provide education on lifestyle modifcations including regular physical activity/exercise, weight management, moderate sodium restriction and increased consumption of fresh fruit, vegetables, and low fat dairy, alcohol moderation, and smoking cessation.;Monitor prescription use compliance.    Expected Outcomes Short Term: Continued assessment and intervention until BP is < 140/24mm HG in hypertensive participants. < 130/64mm HG in hypertensive participants with diabetes, heart failure or chronic kidney disease.;Long Term: Maintenance of blood pressure at goal levels.    Lipids Yes    Intervention Provide education and support for participant on nutrition & aerobic/resistive exercise along with prescribed medications to achieve LDL '70mg'$ , HDL >$Remo'40mg'TVxtf$ .    Expected Outcomes Short Term: Participant states understanding of desired cholesterol values and is compliant with medications prescribed. Participant is following exercise prescription and nutrition guidelines.;Long Term: Cholesterol controlled with medications as prescribed, with individualized exercise RX and with personalized nutrition plan. Value goals: LDL < $Rem'70mg'grpM$ , HDL > 40 mg.             Education:Diabetes - Individual verbal and written instruction to review signs/symptoms of diabetes, desired ranges of glucose level fasting, after meals and with exercise. Acknowledge that pre and post exercise glucose checks will be done for 3 sessions at entry of program. Flowsheet Row Pulmonary Rehab from 03/23/2021 in [facility] Cardiac and Pulmonary Rehab  Date 03/23/21  Educator [facility]  Instruction Review Code 1- Verbalizes Understanding       Core Components/Risk Factors/Patient Goals Review:   Goals and Risk Factor Review     Row Name 05/04/21 1124 06/03/21 1110           Core Components/Risk Factors/Patient Goals Review   Personal Goals Review Weight  Management/Obesity;Hypertension;Heart Failure Improve shortness of breath with ADL's      Review Man is doing well. He is maintaining his weight around 207 lb, trying to maintain weight and not lose  at this time. He weighs at home and knows to note any significant weight gain. Denies any symptons with heart failure. We went over a heart failure hand out and symptoms to look out for. BP has been stable as he checks at home, he only checks it periodically and if/when he feels symptomatic. Ranges 110-211Z systolic and 73V for diastolic. Spoke to patient about their shortness of breath and what they can do to improve. Patient has been informed of breathing techniques when starting the program. Patient is informed to tell staff if they have had any med changes and that certain meds they are taking or not taking can be causing shortness of breath.      Expected Outcomes Short: Continue to monitor weight and heart failure symptoms Long: Continue to manage lifestyle risk factors Short: Attend [facility] regularly to improve shortness of breath with ADL's. Long: maintain independence with ADL's               Core Components/Risk Factors/Patient Goals at Discharge (Final Review):   Goals and Risk Factor Review - 06/03/21 1110       Core Components/Risk Factors/Patient Goals Review   Personal Goals Review Improve shortness of breath with ADL's    Review Spoke to patient about their shortness of breath and what they can do to improve. Patient has been informed of breathing techniques when starting the program. Patient is informed to tell staff if they have had any med changes and that certain meds they are taking or not taking can be causing shortness of breath.    Expected Outcomes Short: Attend [facility] regularly to improve shortness of breath with ADL's. Long: maintain independence with ADL's             ITP Comments:  ITP Comments     Row Name 03/23/21 1348 03/30/21 1624 04/01/21 1103 04/22/21  0908 05/20/21 0733   ITP Comments Virtual Visit completed. Patient informed on EP and RD appointment and 6 Minute walk test. Patient also informed of patient health questionnaires on My Chart. Patient Verbalizes understanding. Visit diagnosis can be found in [facility] 12/02/20. Completed 6MWT and gym orientation. Initial ITP created and sent for review to Dr Ottie Glazier Medical Director. First full day of exercise!  Patient was oriented to gym and equipment including functions, settings, policies, and procedures.  Patient's individual exercise prescription and treatment plan were reviewed.  All starting workloads were established based on the results of the 6 minute walk test done at initial orientation visit.  The plan for exercise progression was also introduced and progression will be customized based on patient's performance and goals. 30 Day review completed. Medical Director ITP review done, changes made as directed, and signed approval by Medical Director. 30 Day review completed. Medical Director ITP review done, changes made as directed, and signed approval by Medical Director.    [place] Name 06/17/21 [phone] 06/26/21 1106         ITP Comments 30 Day review completed. Medical Director ITP review done, changes made as directed, and signed approval by Medical Director. Requan graduated today from  rehab with 36 sessions completed.  Details of the patient's exercise prescription and what He needs to do in order to continue the prescription and progress were discussed with patient.  Patient was given a copy of prescription and goals.  Patient verbalized understanding.  Cale plans to continue to exercise by attending the [facility].  Comments: Discharge ITP

## 2021-08-18 ENCOUNTER — Other Ambulatory Visit (HOSPITAL_COMMUNITY): Payer: Self-pay | Admitting: Neurology

## 2021-08-18 ENCOUNTER — Other Ambulatory Visit: Payer: Self-pay | Admitting: Neurology

## 2021-08-18 DIAGNOSIS — R413 Other amnesia: Secondary | ICD-10-CM

## 2021-09-02 ENCOUNTER — Encounter (HOSPITAL_COMMUNITY): Payer: Self-pay

## 2021-09-02 ENCOUNTER — Ambulatory Visit (HOSPITAL_COMMUNITY): Admission: RE | Admit: 2021-09-02 | Payer: Medicare Other | Source: Ambulatory Visit

## 2021-11-11 ENCOUNTER — Other Ambulatory Visit: Payer: Self-pay

## 2021-11-11 ENCOUNTER — Ambulatory Visit
Admission: RE | Admit: 2021-11-11 | Discharge: 2021-11-11 | Disposition: A | Discharge status: Home / Self Care | Payer: Medicare Other | Source: Ambulatory Visit | Attending: Neurology | Admitting: Neurology

## 2021-11-11 DIAGNOSIS — R413 Other amnesia: Secondary | ICD-10-CM | POA: Insufficient documentation

## 2022-02-08 ENCOUNTER — Other Ambulatory Visit: Payer: Self-pay

## 2022-02-08 ENCOUNTER — Encounter: Payer: Medicare Other | Attending: Specialist

## 2022-02-08 DIAGNOSIS — J439 Emphysema, unspecified: Secondary | ICD-10-CM | POA: Insufficient documentation

## 2022-02-08 NOTE — Progress Notes (Signed)
In patient  Visit completed. Patient informed on EP and RD appointment and 6 Minute walk test. Patient also informed of patient health questionnaires on My Chart. Patient Verbalizes understanding. Visit diagnosis can be found in [facility] 12/08/2021. ?

## 2022-02-15 ENCOUNTER — Ambulatory Visit: Payer: BLUE CROSS/BLUE SHIELD

## 2022-03-02 VITALS — Ht 72.0 in | Wt 203.9 lb

## 2022-03-02 DIAGNOSIS — J439 Emphysema, unspecified: Secondary | ICD-10-CM

## 2022-03-02 NOTE — Patient Instructions (Signed)
Patient Instructions  Patient Details  Name: Jose Ray MRN: [id number] Date of Birth: 1941-04-07 Referring Provider:  Marisue Ivan, MD  Below are your personal goals for exercise, nutrition, and risk factors. Our goal is to help you stay on track towards obtaining and maintaining these goals. We will be discussing your progress on these goals with you throughout the program.  Initial Exercise Prescription:  Initial Exercise Prescription - 03/02/22 1300       Date of Initial Exercise RX and Referring Provider   Date 03/02/22    Referring Provider Ned Clines MD      Oxygen   Oxygen Continuous    Liters 2   Sending request to doctor to increase O2 Rx   Maintain Oxygen Saturation 88% or higher      Treadmill   MPH 1.8    Grade 0    Minutes 15    METs 2.38      Recumbant Bike   Level 1    RPM 60    Watts 10    Minutes 15    METs 2.2      REL-XR   Level 1    Speed 50    Minutes 15    METs 2.2      T5 Nustep   Level 1    SPM 80    Minutes 15    METs 2.2      Prescription Details   Frequency (times per week) 3    Duration Progress to 30 minutes of continuous aerobic without signs/symptoms of physical distress      Intensity   THRR 40-80% of Max Heartrate 101 - 127    Ratings of Perceived Exertion 11-13    Perceived Dyspnea 0-4      Progression   Progression Continue to progress workloads to maintain intensity without signs/symptoms of physical distress.      Resistance Training   Training Prescription Yes    Weight 3 lb    Reps 10-15             Exercise Goals: Frequency: Be able to perform aerobic exercise two to three times per week in program working toward 2-5 days per week of home exercise.  Intensity: Work with a perceived exertion of 11 (fairly light) - 15 (hard) while following your exercise prescription.  We will make changes to your prescription with you as you progress through the program.   Duration: Be able to do 30 to 45  minutes of continuous aerobic exercise in addition to a 5 minute warm-up and a 5 minute cool-down routine.   Nutrition Goals: Your personal nutrition goals will be established when you do your nutrition analysis with the dietician.  The following are general nutrition guidelines to follow: Cholesterol < 200mg /day Sodium < 1500mg /day Fiber: Men over 50 yrs - 30 grams per day  Personal Goals:  Personal Goals and Risk Factors at Admission - 03/02/22 1402       Core Components/Risk Factors/Patient Goals on Admission    Weight Management Yes;Weight Loss    Intervention Weight Management: Develop a combined nutrition and exercise program designed to reach desired caloric intake, while maintaining appropriate intake of nutrient and fiber, sodium and fats, and appropriate energy expenditure required for the weight goal.;Weight Management: Provide education and appropriate resources to help participant work on and attain dietary goals.;Weight Management/Obesity: Establish reasonable short term and long term weight goals.    Admit Weight 203 lb (92.1 kg)  Goal Weight: Short Term 198 lb (89.8 kg)    Goal Weight: Long Term 193 lb (87.5 kg)    Expected Outcomes Short Term: Continue to assess and modify interventions until short term weight is achieved;Long Term: Adherence to nutrition and physical activity/exercise program aimed toward attainment of established weight goal;Weight Loss: Understanding of general recommendations for a balanced deficit meal plan, which promotes 1-2 lb weight loss per week and includes a negative energy balance of [phone] kcal/d;Understanding recommendations for meals to include 15-35% energy as protein, 25-35% energy from fat, 35-60% energy from carbohydrates, less than 200mg  of dietary cholesterol, 20-35 gm of total fiber daily;Understanding of distribution of calorie intake throughout the day with the consumption of 4-5 meals/snacks    Improve shortness of breath with ADL's  Yes    Intervention Provide education, individualized exercise plan and daily activity instruction to help decrease symptoms of SOB with activities of daily living.    Expected Outcomes Short Term: Improve cardiorespiratory fitness to achieve a reduction of symptoms when performing ADLs;Long Term: Be able to perform more ADLs without symptoms or delay the onset of symptoms    Diabetes Yes    Intervention Provide education about signs/symptoms and action to take for hypo/hyperglycemia.;Provide education about proper nutrition, including hydration, and aerobic/resistive exercise prescription along with prescribed medications to achieve blood glucose in normal ranges: Fasting glucose 65-99 mg/dL    Expected Outcomes Short Term: Participant verbalizes understanding of the signs/symptoms and immediate care of hyper/hypoglycemia, proper foot care and importance of medication, aerobic/resistive exercise and nutrition plan for blood glucose control.;Long Term: Attainment of HbA1C < 7%.    Heart Failure Yes    Intervention Provide a combined exercise and nutrition program that is supplemented with education, support and counseling about heart failure. Directed toward relieving symptoms such as shortness of breath, decreased exercise tolerance, and extremity edema.    Expected Outcomes Improve functional capacity of life;Short term: Attendance in program 2-3 days a week with increased exercise capacity. Reported lower sodium intake. Reported increased fruit and vegetable intake. Reports medication compliance.;Short term: Daily weights obtained and reported for increase. Utilizing diuretic protocols set by physician.;Long term: Adoption of self-care skills and reduction of barriers for early signs and symptoms recognition and intervention leading to self-care maintenance.    Hypertension Yes    Intervention Provide education on lifestyle modifcations including regular physical activity/exercise, weight management,  moderate sodium restriction and increased consumption of fresh fruit, vegetables, and low fat dairy, alcohol moderation, and smoking cessation.;Monitor prescription use compliance.    Expected Outcomes Short Term: Continued assessment and intervention until BP is < 140/37mm HG in hypertensive participants. < 130/76mm HG in hypertensive participants with diabetes, heart failure or chronic kidney disease.;Long Term: Maintenance of blood pressure at goal levels.    Lipids Yes    Intervention Provide education and support for participant on nutrition & aerobic/resistive exercise along with prescribed medications to achieve LDL 70mg , HDL >40mg .    Expected Outcomes Short Term: Participant states understanding of desired cholesterol values and is compliant with medications prescribed. Participant is following exercise prescription and nutrition guidelines.;Long Term: Cholesterol controlled with medications as prescribed, with individualized exercise RX and with personalized nutrition plan. Value goals: LDL < 70mg , HDL > 40 mg.             Tobacco Use Initial Evaluation: Social History   Tobacco Use  Smoking Status Former   Packs/day: 1.00   Years: 35.00   Pack years: 35.00   Types:  Cigarettes   Quit date: 131995   Years since quitting: 28.4  Smokeless Tobacco Never    Exercise Goals and Review:  Exercise Goals     Row Name 03/02/22 1402             Exercise Goals   Increase Physical Activity Yes       Intervention Provide advice, education, support and counseling about physical activity/exercise needs.;Develop an individualized exercise prescription for aerobic and resistive training based on initial evaluation findings, risk stratification, comorbidities and participant's personal goals.       Expected Outcomes Short Term: Attend rehab on a regular basis to increase amount of physical activity.;Long Term: Add in home exercise to make exercise part of routine and to increase amount of  physical activity.;Long Term: Exercising regularly at least 3-5 days a week.       Increase Strength and Stamina Yes       Intervention Provide advice, education, support and counseling about physical activity/exercise needs.;Develop an individualized exercise prescription for aerobic and resistive training based on initial evaluation findings, risk stratification, comorbidities and participant's personal goals.       Expected Outcomes Short Term: Increase workloads from initial exercise prescription for resistance, speed, and METs.;Short Term: Perform resistance training exercises routinely during rehab and add in resistance training at home;Long Term: Improve cardiorespiratory fitness, muscular endurance and strength as measured by increased METs and functional capacity (6MWT)       Able to understand and use rate of perceived exertion (RPE) scale Yes       Intervention Provide education and explanation on how to use RPE scale       Expected Outcomes Short Term: Able to use RPE daily in rehab to express subjective intensity level;Long Term:  Able to use RPE to guide intensity level when exercising independently       Able to understand and use Dyspnea scale Yes       Intervention Provide education and explanation on how to use Dyspnea scale       Expected Outcomes Short Term: Able to use Dyspnea scale daily in rehab to express subjective sense of shortness of breath during exertion;Long Term: Able to use Dyspnea scale to guide intensity level when exercising independently       Knowledge and understanding of Target Heart Rate Range (THRR) Yes       Intervention Provide education and explanation of THRR including how the numbers were predicted and where they are located for reference       Expected Outcomes Short Term: Able to state/look up THRR;Short Term: Able to use daily as guideline for intensity in rehab;Long Term: Able to use THRR to govern intensity when exercising independently       Able to  check pulse independently Yes       Intervention Provide education and demonstration on how to check pulse in carotid and radial arteries.;Review the importance of being able to check your own pulse for safety during independent exercise       Expected Outcomes Short Term: Able to explain why pulse checking is important during independent exercise;Long Term: Able to check pulse independently and accurately       Understanding of Exercise Prescription Yes       Intervention Provide education, explanation, and written materials on patient's individual exercise prescription       Expected Outcomes Short Term: Able to explain program exercise prescription;Long Term: Able to explain home exercise prescription to exercise independently  Copy of goals given to participant.

## 2022-03-02 NOTE — Progress Notes (Signed)
Pulmonary Individual Treatment Plan  Patient Details  Name: Jose Ray MRN: [id number] Date of Birth: [birth year] Referring Provider:   Flowsheet Row Pulmonary Rehab from 03/02/2022 in [facility] Cardiac and Pulmonary Rehab  Referring Provider Wallene Huh MD       Initial Encounter Date:  Flowsheet Row Pulmonary Rehab from 03/02/2022 in [facility] Cardiac and Pulmonary Rehab  Date 03/02/22       Visit Diagnosis: Pulmonary emphysema, unspecified emphysema type ([place])  Patient's Home Medications on Admission:  Current Outpatient Medications:    aspirin 81 MG tablet, Take 81 mg by mouth at bedtime., Disp: , Rfl:    atorvastatin (LIPITOR) 80 MG tablet, Take 80 mg by mouth at bedtime. (Patient not taking: Reported on 03/23/2021), Disp: , Rfl:    atorvastatin (LIPITOR) 80 MG tablet, Take 1 tablet by mouth at bedtime. (Patient not taking: Reported on 02/08/2022), Disp: , Rfl:    Cholecalciferol 25 MCG (1000 UT) capsule, Take by mouth., Disp: , Rfl:    cyanocobalamin 1000 MCG tablet, Take by mouth., Disp: , Rfl:    escitalopram (LEXAPRO) 5 MG tablet, Take 5 mg by mouth every morning., Disp: , Rfl:    famotidine (PEPCID) 10 MG tablet, Take 10 mg by mouth daily., Disp: , Rfl:    fexofenadine (ALLEGRA) 180 MG tablet, Take 180 mg by mouth at bedtime., Disp: , Rfl:    fluticasone (FLONASE) 50 MCG/ACT nasal spray, Place 2 sprays into both nostrils at bedtime. (Patient not taking: Reported on 02/08/2022), Disp: , Rfl:    fluticasone (FLONASE) 50 MCG/ACT nasal spray, Place 2 sprays into both nostrils daily., Disp: , Rfl:    fluticasone-salmeterol (ADVAIR) 250-50 MCG/ACT AEPB, Advair Diskus 250 mcg-50 mcg/dose powder for inhalation (Patient not taking: Reported on 02/08/2022), Disp: , Rfl:    Fluticasone-Salmeterol (ADVAIR) 250-50 MCG/DOSE AEPB, Inhale 1 puff into the lungs 2 (two) times daily., Disp: , Rfl:    glipiZIDE (GLUCOTROL) 10 MG tablet, Take 10 mg by mouth daily before breakfast., Disp: , Rfl:     hydrochlorothiazide (HYDRODIURIL) 25 MG tablet, Take 25 mg by mouth daily., Disp: , Rfl:    hydrochlorothiazide (HYDRODIURIL) 25 MG tablet, Take 1 tablet by mouth daily. (Patient not taking: Reported on 03/23/2021), Disp: , Rfl:    icosapent Ethyl (VASCEPA) 1 g capsule, Take 2 g by mouth 2 (two) times daily., Disp: , Rfl:    isosorbide mononitrate (IMDUR) 30 MG 24 hr tablet, Take 30 mg by mouth daily. (Patient not taking: Reported on 02/08/2022), Disp: , Rfl:    isosorbide mononitrate (IMDUR) 30 MG 24 hr tablet, Take 1 tablet by mouth daily., Disp: , Rfl:    losartan (COZAAR) 100 MG tablet, Take 1 tablet by mouth daily., Disp: , Rfl:    losartan (COZAAR) 50 MG tablet, Take 100 mg by mouth daily. (Patient not taking: Reported on 02/08/2022), Disp: , Rfl:    metFORMIN (GLUCOPHAGE) 1000 MG tablet, Take 1,000 mg by mouth 2 (two) times daily., Disp: , Rfl:    metoprolol succinate (TOPROL-XL) 100 MG 24 hr tablet, Take 50 mg by mouth daily. Take with or immediately following a meal. (Patient not taking: Reported on 03/23/2021), Disp: , Rfl:    metoprolol succinate (TOPROL-XL) 25 MG 24 hr tablet, Take 1 tablet by mouth daily., Disp: , Rfl:    Multiple Vitamins-Minerals (PRESERVISION AREDS 2) CAPS, Take 1 capsule by mouth at bedtime., Disp: , Rfl:    OXYGEN, Inhale 2.5-3 L into the lungs See admin instructions. Used while  sleeping and physical activity, Disp: , Rfl:    Polyethyl Glycol-Propyl Glycol 0.4-0.3 % SOLN, Place 1 drop into both eyes See admin instructions. Instill 1 drop into both eyes at night, may use during the day as needed for dry eyes, Disp: , Rfl:    rosuvastatin (CRESTOR) 10 MG tablet, Take 10 mg by mouth every other day., Disp: , Rfl:    tiotropium (SPIRIVA) 18 MCG inhalation capsule, Place 18 mcg into inhaler and inhale daily., Disp: , Rfl:    TRELEGY ELLIPTA 100-62.5-25 MCG/ACT AEPB, SMARTSIG:1 inhalation Via Inhaler Daily, Disp: , Rfl:   Past Medical History: Past Medical History:   Diagnosis Date   Acute MI (HCC)    CHF (congestive heart failure) (HCC)    COPD (chronic obstructive pulmonary disease) (HCC)    Coronary artery disease    Diabetes mellitus without complication (HCC)    GERD (gastroesophageal reflux disease)    History of hiatal hernia    Hypertension     Tobacco Use: Social History   Tobacco Use  Smoking Status Former   Packs/day: 1.00   Years: 35.00   Pack years: 35.00   Types: Cigarettes   Quit date: 1995   Years since quitting: 28.4  Smokeless Tobacco Never    Labs: Review Flowsheet        Latest Ref Rng & Units 10/25/2016  Labs for ITP Cardiac and Pulmonary Rehab  Hemoglobin A1c 4.8 - 5.6 % 7.0             Pulmonary Assessment Scores:  Pulmonary Assessment Scores     Row Name 03/02/22 [phone]         ADL UCSD   ADL Phase Entry     SOB Score total 55     Rest 2     Walk 2     Stairs 4     Bath 2     Dress 1     Shop 3       CAT Score   CAT Score 18       mMRC Score   mMRC Score 3              UCSD: Self-administered rating of dyspnea associated with activities of daily living (ADLs) 6-point scale (0 = "not at all" to 5 = "maximal or unable to do because of breathlessness")  Scoring Scores range from 0 to 120.  Minimally important difference is 5 units  CAT: CAT can identify the health impairment of COPD patients and is better correlated with disease progression.  CAT has a scoring range of zero to 40. The CAT score is classified into four groups of low (less than 10), medium (10 - 20), high (21-30) and very high (31-40) based on the impact level of disease on health status. A CAT score over 10 suggests significant symptoms.  A worsening CAT score could be explained by an exacerbation, poor medication adherence, poor inhaler technique, or progression of COPD or comorbid conditions.  CAT MCID is 2 points  mMRC: mMRC (Modified Medical Research Council) Dyspnea Scale is used to assess the degree of baseline  functional disability in patients of respiratory disease due to dyspnea. No minimal important difference is established. A decrease in score of 1 point or greater is considered a positive change.   Pulmonary Function Assessment:  Pulmonary Function Assessment - 02/08/22 1015       Breath   Shortness of Breath Yes;Limiting activity  Exercise Target Goals: Exercise Program Goal: Individual exercise prescription set using results from initial 6 min walk test and THRR while considering  patient's activity barriers and safety.   Exercise Prescription Goal: Initial exercise prescription builds to 30-45 minutes a day of aerobic activity, 2-3 days per week.  Home exercise guidelines will be given to patient during program as part of exercise prescription that the participant will acknowledge.  Education: Aerobic Exercise: - Group verbal and visual presentation on the components of exercise prescription. Introduces F.I.T.T principle from ACSM for exercise prescriptions.  Reviews F.I.T.T. principles of aerobic exercise including progression. Written material given at graduation. Flowsheet Row Pulmonary Rehab from 06/24/2021 in [facility] Cardiac and Pulmonary Rehab  Date 06/03/21  Educator [facility]  Instruction Review Code 1- Verbalizes Understanding       Education: Resistance Exercise: - Group verbal and visual presentation on the components of exercise prescription. Introduces F.I.T.T principle from ACSM for exercise prescriptions  Reviews F.I.T.T. principles of resistance exercise including progression. Written material given at graduation. Flowsheet Row Pulmonary Rehab from 06/24/2021 in [facility] Cardiac and Pulmonary Rehab  Date 04/08/21  Educator [facility]  Instruction Review Code 1- Verbalizes Understanding        Education: Exercise & Equipment Safety: - Individual verbal instruction and demonstration of equipment use and safety with use of the equipment. Flowsheet Row Pulmonary Rehab  from 02/08/2022 in [facility] Cardiac and Pulmonary Rehab  Date 02/08/22  Educator [facility]  Instruction Review Code 1- Verbalizes Understanding       Education: Exercise Physiology & General Exercise Guidelines: - Group verbal and written instruction with models to review the exercise physiology of the cardiovascular system and associated critical values. Provides general exercise guidelines with specific guidelines to those with heart or lung disease.  Flowsheet Row Pulmonary Rehab from 06/24/2021 in [facility] Cardiac and Pulmonary Rehab  Date 05/27/21  Educator [facility]  Instruction Review Code 1- Verbalizes Understanding       Education: Flexibility, Balance, Mind/Body Relaxation: - Group verbal and visual presentation with interactive activity on the components of exercise prescription. Introduces F.I.T.T principle from ACSM for exercise prescriptions. Reviews F.I.T.T. principles of flexibility and balance exercise training including progression. Also discusses the mind body connection.  Reviews various relaxation techniques to help reduce and manage stress (i.e. Deep breathing, progressive muscle relaxation, and visualization). Balance handout provided to take home. Written material given at graduation. Flowsheet Row Pulmonary Rehab from 06/24/2021 in [facility] Cardiac and Pulmonary Rehab  Date 06/17/21  Educator [facility]  Instruction Review Code 1- Verbalizes Understanding       Activity Barriers & Risk Stratification:  Activity Barriers & Cardiac Risk Stratification - 03/02/22 1355       Activity Barriers & Cardiac Risk Stratification   Activity Barriers Shortness of Breath;Muscular Weakness             6 Minute Walk:  6 Minute Walk     Row Name 03/02/22 1355         6 Minute Walk   Phase Initial     Distance 1130 feet     Walk Time 6 minutes     # of Rest Breaks 0     MPH 2.14     METS 2.22     RPE 13     Perceived Dyspnea  3     VO2 Peak 7.77     Symptoms Yes (comment)     Comments SOB      Resting HR 75 bpm  Resting BP 134/72     Resting Oxygen Saturation  92 %     Exercise Oxygen Saturation  during 6 min walk 83 %     Max Ex. HR 96 bpm     Max Ex. BP 150/72     2 Minute Post BP 128/70       Interval HR   1 Minute HR 87     2 Minute HR 93     3 Minute HR 94     4 Minute HR 95     5 Minute HR 96     6 Minute HR 94     2 Minute Post HR 70     Interval Heart Rate? Yes       Interval Oxygen   Interval Oxygen? Yes     Baseline Oxygen Saturation % 92 %  RA: 88%     1 Minute Oxygen Saturation % 89 %     1 Minute Liters of Oxygen 4 L  pulsed     2 Minute Oxygen Saturation % 85 %     2 Minute Liters of Oxygen 4 L     3 Minute Oxygen Saturation % 85 %     3 Minute Liters of Oxygen 4 L     4 Minute Oxygen Saturation % 83 %     4 Minute Liters of Oxygen 4 L     5 Minute Oxygen Saturation % 85 %     5 Minute Liters of Oxygen 4 L     6 Minute Oxygen Saturation % 83 %     6 Minute Liters of Oxygen 4 L     2 Minute Post Oxygen Saturation % 94 %     2 Minute Post Liters of Oxygen 4 L             Oxygen Initial Assessment:  Oxygen Initial Assessment - 03/02/22 0950       Home Oxygen   Home Oxygen Device Portable Concentrator;E-Tanks;Home Concentrator    Sleep Oxygen Prescription Continuous    Liters per minute 3    Home Exercise Oxygen Prescription Continuous    Liters per minute 3    Home Resting Oxygen Prescription Continuous    Liters per minute 0   Uses PRN- encouraged to use all times above 88%   Compliance with Home Oxygen Use Yes      Initial 6 min Walk   Oxygen Used Pulsed    Liters per minute 4      Program Oxygen Prescription   Program Oxygen Prescription Continuous    Liters per minute 2   Reaching out to MD for increase in O2 for prescription     Intervention   Short Term Goals To learn and demonstrate proper pursed lip breathing techniques or other breathing techniques. ;To learn and exhibit compliance with exercise, home and travel O2  prescription;To learn and understand importance of monitoring SPO2 with pulse oximeter and demonstrate accurate use of the pulse oximeter.;To learn and understand importance of maintaining oxygen saturations>88%;To learn and demonstrate proper use of respiratory medications    Long  Term Goals Exhibits compliance with exercise, home  and travel O2 prescription;Verbalizes importance of monitoring SPO2 with pulse oximeter and return demonstration;Maintenance of O2 saturations>88%;Exhibits proper breathing techniques, such as pursed lip breathing or other method taught during program session;Compliance with respiratory medication;Demonstrates proper use of MDI's             Oxygen Re-Evaluation:  Oxygen Discharge (Final Oxygen Re-Evaluation):   Initial Exercise Prescription:  Initial Exercise Prescription - 03/02/22 1300       Date of Initial Exercise RX and Referring Provider   Date 03/02/22    Referring Provider Ned Clines MD      Oxygen   Oxygen Continuous    Liters 2   Sending request to doctor to increase O2 Rx   Maintain Oxygen Saturation 88% or higher      Treadmill   MPH 1.8    Grade 0    Minutes 15    METs 2.38      Recumbant Bike   Level 1    RPM 60    Watts 10    Minutes 15    METs 2.2      REL-XR   Level 1    Speed 50    Minutes 15    METs 2.2      T5 Nustep   Level 1    SPM 80    Minutes 15    METs 2.2      Prescription Details   Frequency (times per week) 3    Duration Progress to 30 minutes of continuous aerobic without signs/symptoms of physical distress      Intensity   THRR 40-80% of Max Heartrate 101 - 127    Ratings of Perceived Exertion 11-13    Perceived Dyspnea 0-4      Progression   Progression Continue to progress workloads to maintain intensity without signs/symptoms of physical distress.      Resistance Training   Training Prescription Yes    Weight 3 lb    Reps 10-15             Perform Capillary Blood Glucose  checks as needed.  Exercise Prescription Changes:   Exercise Prescription Changes     Row Name 03/02/22 1300             Response to Exercise   Blood Pressure (Admit) 132/72       Blood Pressure (Exercise) 150/72       Blood Pressure (Exit) 128/70       Heart Rate (Admit) 75 bpm       Heart Rate (Exercise) 96 bpm       Heart Rate (Exit) 70 bpm       Oxygen Saturation (Admit) 92 %       Oxygen Saturation (Exercise) 83 %       Oxygen Saturation (Exit) 94 %       Rating of Perceived Exertion (Exercise) 13       Perceived Dyspnea (Exercise) 3       Symptoms SOB       Comments walk test results                Exercise Comments:   Exercise Goals and Review:   Exercise Goals     Row Name 03/02/22 1402             Exercise Goals   Increase Physical Activity Yes       Intervention Provide advice, education, support and counseling about physical activity/exercise needs.;Develop an individualized exercise prescription for aerobic and resistive training based on initial evaluation findings, risk stratification, comorbidities and participant's personal goals.       Expected Outcomes Short Term: Attend rehab on a regular basis to increase amount of physical activity.;Long Term: Add in home exercise to make exercise part of routine and to increase  amount of physical activity.;Long Term: Exercising regularly at least 3-5 days a week.       Increase Strength and Stamina Yes       Intervention Provide advice, education, support and counseling about physical activity/exercise needs.;Develop an individualized exercise prescription for aerobic and resistive training based on initial evaluation findings, risk stratification, comorbidities and participant's personal goals.       Expected Outcomes Short Term: Increase workloads from initial exercise prescription for resistance, speed, and METs.;Short Term: Perform resistance training exercises routinely during rehab and add in resistance  training at home;Long Term: Improve cardiorespiratory fitness, muscular endurance and strength as measured by increased METs and functional capacity ( )       Able to understand and use rate of perceived exertion (RPE) scale Yes       Intervention Provide education and explanation on how to use RPE scale       Expected Outcomes Short Term: Able to use RPE daily in rehab to express subjective intensity level;Long Term:  Able to use RPE to guide intensity level when exercising independently       Able to understand and use Dyspnea scale Yes       Intervention Provide education and explanation on how to use Dyspnea scale       Expected Outcomes Short Term: Able to use Dyspnea scale daily in rehab to express subjective sense of shortness of breath during exertion;Long Term: Able to use Dyspnea scale to guide intensity level when exercising independently       Knowledge and understanding of Target Heart Rate Range (THRR) Yes       Intervention Provide education and explanation of THRR including how the numbers were predicted and where they are located for reference       Expected Outcomes Short Term: Able to state/look up THRR;Short Term: Able to use daily as guideline for intensity in rehab;Long Term: Able to use THRR to govern intensity when exercising independently       Able to check pulse independently Yes       Intervention Provide education and demonstration on how to check pulse in carotid and radial arteries.;Review the importance of being able to check your own pulse for safety during independent exercise       Expected Outcomes Short Term: Able to explain why pulse checking is important during independent exercise;Long Term: Able to check pulse independently and accurately       Understanding of Exercise Prescription Yes       Intervention Provide education, explanation, and written materials on patient's individual exercise prescription       Expected Outcomes Short Term: Able to explain  program exercise prescription;Long Term: Able to explain home exercise prescription to exercise independently                Exercise Goals Re-Evaluation :   Discharge Exercise Prescription (Final Exercise Prescription Changes):  Exercise Prescription Changes - 03/02/22 1300       Response to Exercise   Blood Pressure (Admit) 132/72    Blood Pressure (Exercise) 150/72    Blood Pressure (Exit) 128/70    Heart Rate (Admit) 75 bpm    Heart Rate (Exercise) 96 bpm    Heart Rate (Exit) 70 bpm    Oxygen Saturation (Admit) 92 %    Oxygen Saturation (Exercise) 83 %    Oxygen Saturation (Exit) 94 %    Rating of Perceived Exertion (Exercise) 13    Perceived Dyspnea (Exercise) 3  Symptoms SOB    Comments walk test results             Nutrition:  Target Goals: Understanding of nutrition guidelines, daily intake of sodium 1500mg , cholesterol 200mg , calories 30% from fat and 7% or less from saturated fats, daily to have 5 or more servings of fruits and vegetables.  Education: All About Nutrition: -Group instruction provided by verbal, written material, interactive activities, discussions, models, and posters to present general guidelines for heart healthy nutrition including fat, fiber, MyPlate, the role of sodium in heart healthy nutrition, utilization of the nutrition label, and utilization of this knowledge for meal planning. Follow up email sent as well. Written material given at graduation. Flowsheet Row Pulmonary Rehab from 06/24/2021 in [facility] Cardiac and Pulmonary Rehab  Date 04/22/21  Educator [facility]  Instruction Review Code 1- Verbalizes Understanding       Biometrics:  Pre Biometrics - 03/02/22 1355       Pre Biometrics   Height 6' (1.829 m)    Weight 203 lb 14.4 oz (92.5 kg)    BMI (Calculated) 27.65    Single Leg Stand 1.37 seconds              Nutrition Therapy Plan and Nutrition Goals:  Nutrition Therapy & Goals - 03/02/22 0955       Intervention  Plan   Intervention Prescribe, educate and counsel regarding individualized specific dietary modifications aiming towards targeted core components such as weight, hypertension, lipid management, diabetes, heart failure and other comorbidities.    Expected Outcomes Short Term Goal: Understand basic principles of dietary content, such as calories, fat, sodium, cholesterol and nutrients.;Short Term Goal: A plan has been developed with personal nutrition goals set during dietitian appointment.;Long Term Goal: Adherence to prescribed nutrition plan.             Nutrition Assessments:  MEDIFICTS Score Key: ?70 Need to make dietary changes  40-70 Heart Healthy Diet ? 40 Therapeutic Level Cholesterol Diet  Flowsheet Row Pulmonary Rehab from 03/02/2022 in [facility] Cardiac and Pulmonary Rehab  Picture Your Plate Total Score on Admission 60      Picture Your Plate Scores: <16 Unhealthy dietary pattern with much room for improvement. 41-50 Dietary pattern unlikely to meet recommendations for good health and room for improvement. 51-60 More healthful dietary pattern, with some room for improvement.  >60 Healthy dietary pattern, although there may be some specific behaviors that could be improved.   Nutrition Goals Re-Evaluation:   Nutrition Goals Discharge (Final Nutrition Goals Re-Evaluation):   Psychosocial: Target Goals: Acknowledge presence or absence of significant depression and/or stress, maximize coping skills, provide positive support system. Participant is able to verbalize types and ability to use techniques and skills needed for reducing stress and depression.   Education: Stress, Anxiety, and Depression - Group verbal and visual presentation to define topics covered.  Reviews how body is impacted by stress, anxiety, and depression.  Also discusses healthy ways to reduce stress and to treat/manage anxiety and depression.  Written material given at graduation. Flowsheet Row Pulmonary  Rehab from 06/24/2021 in [facility] Cardiac and Pulmonary Rehab  Date 05/20/21  Educator [facility]  Instruction Review Code 1- Verbalizes Understanding       Education: Sleep Hygiene -Provides group verbal and written instruction about how sleep can affect your health.  Define sleep hygiene, discuss sleep cycles and impact of sleep habits. Review good sleep hygiene tips.    Initial Review & Psychosocial Screening:  Initial Psych Review &  Screening - 02/08/22 1017       Initial Review   Current issues with Current Psychotropic Meds      Family Dynamics   Good Support System? Yes    Comments Omauri has a great support system. His wife, daughter that lives down the street and his other sons call him and visit. He has a positive outlook on his health. He has done the program a few times and is looking to get into shape for his breathing.      Barriers   Psychosocial barriers to participate in program The patient should benefit from training in stress management and relaxation.      Screening Interventions   Interventions To provide support and resources with identified psychosocial needs;Provide feedback about the scores to participant;Encouraged to exercise    Expected Outcomes Short Term goal: Utilizing psychosocial counselor, staff and physician to assist with identification of specific Stressors or current issues interfering with healing process. Setting desired goal for each stressor or current issue identified.;Long Term Goal: Stressors or current issues are controlled or eliminated.;Short Term goal: Identification and review with participant of any Quality of Life or Depression concerns found by scoring the questionnaire.;Long Term goal: The participant improves quality of Life and PHQ9 Scores as seen by post scores and/or verbalization of changes             Quality of Life Scores:  Scores of 19 and below usually indicate a poorer quality of life in these areas.  A difference of  2-3  points is a clinically meaningful difference.  A difference of 2-3 points in the total score of the Quality of Life Index has been associated with significant improvement in overall quality of life, self-image, physical symptoms, and general health in studies assessing change in quality of life.  PHQ-9: Review Flowsheet        03/02/2022 06/17/2021 04/29/2021 03/30/2021  Depression screen PHQ 2/9  Decreased Interest 0 1 0 0  Down, Depressed, Hopeless 0 0 0 0  PHQ - 2 Score 0 1 0 0  Altered sleeping 0 0 1 2  Tired, decreased energy 1 2 0 3  Change in appetite 1 0 0 1  Feeling bad or failure about yourself  0 0 0 0  Trouble concentrating 0 2 3 2   Moving slowly or fidgety/restless 0 0 0 2  Suicidal thoughts 0 0 0 0  PHQ-9 Score 2 5 4 10   Difficult doing work/chores Not difficult at all Not difficult at all Not difficult at all Somewhat difficult         Interpretation of Total Score  Total Score Depression Severity:  1-4 = Minimal depression, 5-9 = Mild depression, 10-14 = Moderate depression, 15-19 = Moderately severe depression, 20-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 His wife, daughter that lives down the street and his other sons call him and visit. He has a positive outlook on his health. He has done the program a few times and is looking to get into shape for his breathing.    Expected Outcomes Short: Start [facility] to help with mood. Long: Maintain a healthy mental state    Continue Psychosocial Services  Follow up required by staff  Psychosocial Re-Evaluation:   Psychosocial Discharge (Final Psychosocial Re-Evaluation):   Education: Education Goals:  Education classes will be provided on a weekly basis, covering required topics. Participant will state understanding/return demonstration of topics presented.  Learning Barriers/Preferences:  Learning Barriers/Preferences - 02/08/22 1017       Learning Barriers/Preferences   Learning Barriers None    Learning Preferences None             General Pulmonary Education Topics:  Infection Prevention: - Provides verbal and written material to individual with discussion of infection control including proper hand washing and proper equipment cleaning during exercise session. Flowsheet Row Pulmonary Rehab from 02/08/2022 in [facility] Cardiac and Pulmonary Rehab  Date 02/08/22  Educator [facility]  Instruction Review Code 1- Verbalizes Understanding       Falls Prevention: - Provides verbal and written material to individual with discussion of falls prevention and safety. Flowsheet Row Pulmonary Rehab from 02/08/2022 in [facility] Cardiac and Pulmonary Rehab  Date 02/08/22  Educator [facility]  Instruction Review Code 1- Verbalizes Understanding       Chronic Lung Disease Review: - Group verbal instruction with posters, models, PowerPoint presentations and videos,  to review new updates, new respiratory medications, new advancements in procedures and treatments. Providing information on websites and "800" numbers for continued self-education. Includes information about supplement oxygen, available portable oxygen systems, continuous and intermittent flow rates, oxygen safety, concentrators, and Medicare reimbursement for oxygen. Explanation of Pulmonary Drugs, including class, frequency, complications, importance of spacers, rinsing mouth after steroid MDI's, and proper cleaning methods for nebulizers. Review of basic lung anatomy and physiology related to function, structure, and complications of lung disease. Review of risk factors. Discussion about methods for diagnosing sleep apnea and types of masks and machines  for OSA. Includes a review of the use of types of environmental controls: home humidity, furnaces, filters, dust mite/pet prevention, HEPA vacuums. Discussion about weather changes, air quality and the benefits of nasal washing. Instruction on Warning signs, infection symptoms, calling MD promptly, preventive modes, and value of vaccinations. Review of effective airway clearance, coughing and/or vibration techniques. Emphasizing that all should Create an Action Plan. Written material given at graduation. Flowsheet Row Pulmonary Rehab from 03/02/2022 in [facility] Cardiac and Pulmonary Rehab  Education need identified 03/02/22       AED/CPR: - Group verbal and written instruction with the use of models to demonstrate the basic use of the AED with the basic ABC's of resuscitation.    Anatomy and Cardiac Procedures: - Group verbal and visual presentation and models provide information about basic cardiac anatomy and function. Reviews the testing methods done to diagnose heart disease and the outcomes of the test results. Describes the treatment choices: Medical Management, Angioplasty, or Coronary Bypass Surgery for treating various heart conditions including Myocardial Infarction, Angina, Valve Disease, and Cardiac Arrhythmias.  Written material given at graduation. Flowsheet Row Pulmonary Rehab from 06/24/2021 in [facility] Cardiac and Pulmonary Rehab  Date 04/08/21  Educator SB  Instruction Review Code 1- Verbalizes Understanding       Medication Safety: - Group verbal and visual instruction to review commonly prescribed medications for heart and lung disease. Reviews the medication, class of the drug, and side effects. Includes the steps to properly store meds and maintain the prescription regimen.  Written material given at graduation. Flowsheet Row Pulmonary Rehab from 06/24/2021 in [facility] Cardiac and Pulmonary Rehab  Date 06/24/21  Educator SB  Instruction Review Code 1- Verbalizes Understanding  Other: -Provides group and verbal instruction on various topics (see comments)   Knowledge Questionnaire Score:  Knowledge Questionnaire Score - 03/02/22 1404       Knowledge Questionnaire Score   Pre Score 15/18              Core Components/Risk Factors/Patient Goals at Admission:  Personal Goals and Risk Factors at Admission - 03/02/22 1402       Core Components/Risk Factors/Patient Goals on Admission    Weight Management Yes;Weight Loss    Intervention Weight Management: Develop a combined nutrition and exercise program designed to reach desired caloric intake, while maintaining appropriate intake of nutrient and fiber, sodium and fats, and appropriate energy expenditure required for the weight goal.;Weight Management: Provide education and appropriate resources to help participant work on and attain dietary goals.;Weight Management/Obesity: Establish reasonable short term and long term weight goals.    Admit Weight 203 lb (92.1 kg)    Goal Weight: Short Term 198 lb (89.8 kg)    Goal Weight: Long Term 193 lb (87.5 kg)    Expected Outcomes Short Term: Continue to assess and modify interventions until short term weight is achieved;Long Term: Adherence to nutrition and physical activity/exercise program aimed toward attainment of established weight goal;Weight Loss: Understanding of general recommendations for a balanced deficit meal plan, which promotes 1-2 lb weight loss per week and includes a negative energy balance of [phone] kcal/d;Understanding recommendations for meals to include 15-35% energy as protein, 25-35% energy from fat, 35-60% energy from carbohydrates, less than 200mg  of dietary cholesterol, 20-35 gm of total fiber daily;Understanding of distribution of calorie intake throughout the day with the consumption of 4-5 meals/snacks    Improve shortness of breath with ADL's Yes    Intervention Provide education, individualized exercise plan and daily activity  instruction to help decrease symptoms of SOB with activities of daily living.    Expected Outcomes Short Term: Improve cardiorespiratory fitness to achieve a reduction of symptoms when performing ADLs;Long Term: Be able to perform more ADLs without symptoms or delay the onset of symptoms    Diabetes Yes    Intervention Provide education about signs/symptoms and action to take for hypo/hyperglycemia.;Provide education about proper nutrition, including hydration, and aerobic/resistive exercise prescription along with prescribed medications to achieve blood glucose in normal ranges: Fasting glucose 65-99 mg/dL    Expected Outcomes Short Term: Participant verbalizes understanding of the signs/symptoms and immediate care of hyper/hypoglycemia, proper foot care and importance of medication, aerobic/resistive exercise and nutrition plan for blood glucose control.;Long Term: Attainment of HbA1C < 7%.    Heart Failure Yes    Intervention Provide a combined exercise and nutrition program that is supplemented with education, support and counseling about heart failure. Directed toward relieving symptoms such as shortness of breath, decreased exercise tolerance, and extremity edema.    Expected Outcomes Improve functional capacity of life;Short term: Attendance in program 2-3 days a week with increased exercise capacity. Reported lower sodium intake. Reported increased fruit and vegetable intake. Reports medication compliance.;Short term: Daily weights obtained and reported for increase. Utilizing diuretic protocols set by physician.;Long term: Adoption of self-care skills and reduction of barriers for early signs and symptoms recognition and intervention leading to self-care maintenance.    Hypertension Yes    Intervention Provide education on lifestyle modifcations including regular physical activity/exercise, weight management, moderate sodium restriction and increased consumption of fresh fruit, vegetables, and low  fat dairy, alcohol moderation, and smoking cessation.;Monitor prescription use compliance.    Expected Outcomes  Short Term: Continued assessment and intervention until BP is < 140/5990mm HG in hypertensive participants. < 130/4580mm HG in hypertensive participants with diabetes, heart failure or chronic kidney disease.;Long Term: Maintenance of blood pressure at goal levels.    Lipids Yes    Intervention Provide education and support for participant on nutrition & aerobic/resistive exercise along with prescribed medications to achieve LDL 70mg , HDL >40mg .    Expected Outcomes Short Term: Participant states understanding of desired cholesterol values and is compliant with medications prescribed. Participant is following exercise prescription and nutrition guidelines.;Long Term: Cholesterol controlled with medications as prescribed, with individualized exercise RX and with personalized nutrition plan. Value goals: LDL < 70mg , HDL > 40 mg.             Education:Diabetes - Individual verbal and written instruction to review signs/symptoms of diabetes, desired ranges of glucose level fasting, after meals and with exercise. Acknowledge that pre and post exercise glucose checks will be done for 3 sessions at entry of program. Flowsheet Row [facility]monary [facility]  Date 02/08/22  [facility]  Instruction Review Code 1- Verbalizes Understanding       Know Your Numbers and Heart Failure: - Group verbal and visual instruction to discuss disease risk factors for cardiac and pulmonary disease and treatment options.  Reviews associated critical values for Overweight/Obesity, Hypertension, Cholesterol, and Diabetes.  Discusses basics of heart failure: signs/symptoms and treatments.  Introduces Heart Failure Zone chart for action plan for heart failure.  Written material given at graduation. Flowsheet Row [facility]monary [facility]  [facility]  Date 05/06/21  Educator [facility]  Instruction Review Code 1- Verbalizes Understanding       Core Components/Risk Factors/Patient Goals Review:    Core Components/Risk Factors/Patient Goals at Discharge (Final Review):    ITP Comments:  ITP Comments     Row Name 02/08/22 1026 03/02/22 0947         ITP Comments In patient  Visit completed. Patient informed on EP and RD appointment and 6 Minute walk test. Patient also informed of patient health questionnaires on My Chart. Patient Verbalizes understanding. [facility]. Completed 6MWT and gym orientation. Initial ITP created and sent for review to Dr. Vida RiggerFuad Aleskerov,  Medical Director.               Comments: Initial ITP

## 2022-03-12 ENCOUNTER — Encounter: Payer: Medicare Other | Attending: Specialist | Admitting: *Deleted

## 2022-03-12 DIAGNOSIS — J439 Emphysema, unspecified: Secondary | ICD-10-CM | POA: Insufficient documentation

## 2022-03-12 LAB — GLUCOSE, CAPILLARY
Glucose-Capillary: 216 mg/dL — ABNORMAL HIGH (ref 70–99)
Glucose-Capillary: 138 mg/dL — ABNORMAL HIGH (ref 70–99)

## 2022-03-12 NOTE — Progress Notes (Signed)
Daily Session Note  Patient Details  Name: Jose Ray MRN: [id number] Date of Birth: February 03, 1941 Referring Provider:   Flowsheet Row Pulmonary Rehab from 03/02/2022 in [facility] Cardiac and Pulmonary Rehab  Referring Provider Wallene Huh MD       Encounter Date: 03/12/2022  Check In:  Session Check In - 03/12/22 0942       Check-In   Supervising physician immediately available to respond to emergencies See telemetry face sheet for immediately available ER MD    Location [facility]-Cardiac & Pulmonary Rehab    Staff Present Heath Lark, RN, BSN, CCRP;Joseph [place], RCP,RRT,BSRT;Jessica [place], [place], [place], CCRP, CCET    Virtual Visit No    Medication changes reported     No    Fall or balance concerns reported    No    Warm-up and Cool-down Performed on first and last piece of equipment    Resistance Training Performed Yes    VAD Patient? No    PAD/SET Patient? No      Pain Assessment   Currently in Pain? No/denies                Social History   Tobacco Use  Smoking Status Former   Packs/day: 1.00   Years: 35.00   Pack years: 35.00   Types: Cigarettes   Quit date: 1995   Years since quitting: 28.4  Smokeless Tobacco Never    Goals Met:  Proper associated with RPD/PD & O2 Sat Exercise tolerated well Personal goals reviewed No report of concerns or symptoms today  Goals Unmet:  Not Applicable  Comments: First full day of exercise!  Patient was oriented to gym and equipment including functions, settings, policies, and procedures.  Patient's individual exercise prescription and treatment plan were reviewed.  All starting workloads were established based on the results of the 6 minute walk test done at initial orientation visit.  The plan for exercise progression was also introduced and progression will be customized based on patient's performance and goals.    Dr. Emily Filbert is Medical Director for [place].  Dr. Ottie Glazier is  Medical Director for [facility] Pulmonary Rehabilitation.

## 2022-03-15 ENCOUNTER — Encounter: Payer: Medicare Other | Admitting: *Deleted

## 2022-03-15 DIAGNOSIS — J439 Emphysema, unspecified: Secondary | ICD-10-CM | POA: Diagnosis not present

## 2022-03-15 LAB — GLUCOSE, CAPILLARY: Glucose-Capillary: 128 mg/dL — ABNORMAL HIGH (ref 70–99)

## 2022-03-15 NOTE — Progress Notes (Signed)
Daily Session Note  Patient Details  Name: Jose Ray MRN: [id number] Date of Birth: [birth year] Referring Provider:   Flowsheet Row Pulmonary Rehab from 03/02/2022 in [facility] Cardiac and Pulmonary Rehab  Referring Provider Wallene Huh MD       Encounter Date: 03/15/2022  Check In:  Session Check In - 03/15/22 0956       Check-In   Supervising physician immediately available to respond to emergencies See telemetry face sheet for immediately available ER MD    Location [facility]-Cardiac & Pulmonary Rehab    Staff Present Earlean Shawl, BS, ACSM CEP, Exercise Physiologist;Kara Eliezer Bottom, MS, ASCM CEP, Exercise Physiologist;Rileyann Florance, RN, BSN, CCRP    Virtual Visit No    Medication changes reported     No    Fall or balance concerns reported    No    Warm-up and Cool-down Performed on first and last piece of equipment    Resistance Training Performed Yes    VAD Patient? No    PAD/SET Patient? No      Pain Assessment   Currently in Pain? No/denies                Social History   Tobacco Use  Smoking Status Former   Packs/day: 1.00   Years: 35.00   Pack years: 35.00   Types: Cigarettes   Quit date: 1995   Years since quitting: 28.4  Smokeless Tobacco Never    Goals Met:  Proper associated with RPD/PD & O2 Sat Independence with exercise equipment Exercise tolerated well No report of concerns or symptoms today  Goals Unmet:  Not Applicable  Comments: Pt able to follow exercise prescription today without complaint.  Will continue to monitor for progression.    Dr. Emily Filbert is Medical Director for [place].  Dr. Ottie Glazier is Medical Director for [facility] Pulmonary Rehabilitation.

## 2022-03-17 DIAGNOSIS — J439 Emphysema, unspecified: Secondary | ICD-10-CM

## 2022-03-17 LAB — GLUCOSE, CAPILLARY
Glucose-Capillary: 169 mg/dL — ABNORMAL HIGH (ref 70–99)
Glucose-Capillary: 125 mg/dL — ABNORMAL HIGH (ref 70–99)

## 2022-03-17 NOTE — Progress Notes (Signed)
Daily Session Note  Patient Details  Name: Jose Ray MRN: [id number] Date of Birth: [birth year] Referring Provider:   Flowsheet Row Pulmonary Rehab from 03/02/2022 in [facility] Cardiac and Pulmonary Rehab  Referring Provider Wallene Huh MD       Encounter Date: 03/17/2022  Check In:  Session Check In - 03/17/22 0924       Check-In   Supervising physician immediately available to respond to emergencies See telemetry face sheet for immediately available ER MD    Location [facility]-Cardiac & Pulmonary Rehab    Staff Present Carson Myrtle, BS, RRT, CPFT;Joseph Karie Fetch, MPA, RN    Virtual Visit No    Medication changes reported     No    Fall or balance concerns reported    No    Warm-up and Cool-down Performed on first and last piece of equipment    Resistance Training Performed Yes    VAD Patient? No    PAD/SET Patient? No      Pain Assessment   Currently in Pain? No/denies                Social History   Tobacco Use  Smoking Status Former   Packs/day: 1.00   Years: 35.00   Pack years: 35.00   Types: Cigarettes   Quit date: 1995   Years since quitting: 28.4  Smokeless Tobacco Never    Goals Met:  Independence with exercise equipment Exercise tolerated well No report of concerns or symptoms today Strength training completed today  Goals Unmet:  Not Applicable  Comments: Pt able to follow exercise prescription today without complaint.  Will continue to monitor for progression.    Dr. Emily Filbert is Medical Director for [place].  Dr. Ottie Glazier is Medical Director for [facility] Pulmonary Rehabilitation.

## 2022-03-19 ENCOUNTER — Encounter: Payer: Medicare Other | Admitting: *Deleted

## 2022-03-19 DIAGNOSIS — J439 Emphysema, unspecified: Secondary | ICD-10-CM | POA: Diagnosis not present

## 2022-03-19 NOTE — Progress Notes (Signed)
Daily Session Note  Patient Details  Name: Jose Ray MRN: [id number] Date of Birth: 09-29-1941 Referring Provider:   Flowsheet Row Pulmonary Rehab from 03/02/2022 in [facility] Cardiac and Pulmonary Rehab  Referring Provider Wallene Huh MD       Encounter Date: 03/19/2022  Check In:  Session Check In - 03/19/22 1037       Check-In   Supervising physician immediately available to respond to emergencies See telemetry face sheet for immediately available ER MD    Location [facility]-Cardiac & Pulmonary Rehab    Staff Present Heath Lark, RN, BSN, CCRP;Jessica [place], MA, RCEP, CCRP, CCET;Joseph [place], [place]    Virtual Visit No    Medication changes reported     No    Fall or balance concerns reported    No    Warm-up and Cool-down Performed on first and last piece of equipment    Resistance Training Performed Yes    VAD Patient? No    PAD/SET Patient? No      Pain Assessment   Currently in Pain? No/denies                Social History   Tobacco Use  Smoking Status Former   Packs/day: 1.00   Years: 35.00   Total pack years: 35.00   Types: Cigarettes   Quit date: 1995   Years since quitting: 28.4  Smokeless Tobacco Never    Goals Met:  Proper associated with RPD/PD & O2 Sat Independence with exercise equipment Exercise tolerated well No report of concerns or symptoms today  Goals Unmet:  Not Applicable  Comments: Pt able to follow exercise prescription today without complaint.  Will continue to monitor for progression.    Dr. Emily Filbert is Medical Director for [place].  Dr. Ottie Glazier is Medical Director for [facility] Pulmonary Rehabilitation.

## 2022-03-22 ENCOUNTER — Encounter: Payer: Medicare Other | Admitting: *Deleted

## 2022-03-22 DIAGNOSIS — J439 Emphysema, unspecified: Secondary | ICD-10-CM

## 2022-03-22 NOTE — Progress Notes (Signed)
Daily Session Note  Patient Details  Name: Jose Ray MRN: [id number] Date of Birth: 1940/11/20 Referring Provider:   Flowsheet Row Pulmonary Rehab from 03/02/2022 in [facility] Cardiac and Pulmonary Rehab  Referring Provider Wallene Huh MD       Encounter Date: 03/22/2022  Check In:  Session Check In - 03/22/22 1112       Check-In   Supervising physician immediately available to respond to emergencies See telemetry face sheet for immediately available ER MD    Location [facility]-Cardiac & Pulmonary Rehab    Staff Present Heath Lark, RN, BSN, [place], BS, ACSM CEP, Exercise Physiologist;Joseph [place], [place]    Virtual Visit No    Medication changes reported     No    Fall or balance concerns reported    No    Warm-up and Cool-down Performed on first and last piece of equipment    Resistance Training Performed Yes    VAD Patient? No    PAD/SET Patient? No      Pain Assessment   Currently in Pain? No/denies                Social History   Tobacco Use  Smoking Status Former   Packs/day: 1.00   Years: 35.00   Total pack years: 35.00   Types: Cigarettes   Quit date: 1995   Years since quitting: 28.4  Smokeless Tobacco Never    Goals Met:  Proper associated with RPD/PD & O2 Sat Independence with exercise equipment Exercise tolerated well No report of concerns or symptoms today  Goals Unmet:  Not Applicable  Comments: Pt able to follow exercise prescription today without complaint.  Will continue to monitor for progression.    Dr. Emily Filbert is Medical Director for [place].  Dr. Ottie Glazier is Medical Director for [facility] Pulmonary Rehabilitation.

## 2022-03-24 ENCOUNTER — Encounter: Payer: Medicare Other | Admitting: *Deleted

## 2022-03-24 ENCOUNTER — Encounter: Payer: Self-pay | Admitting: *Deleted

## 2022-03-24 DIAGNOSIS — J439 Emphysema, unspecified: Secondary | ICD-10-CM

## 2022-03-24 NOTE — Progress Notes (Signed)
Pulmonary Individual Treatment Plan  Patient Details  Name: Jose Ray MRN: [id number] Date of Birth: [birth year] Referring Provider:   Flowsheet Row Pulmonary Rehab from 03/02/2022 in [facility] Cardiac and Pulmonary Rehab  Referring Provider Wallene Huh MD       Initial Encounter Date:  Flowsheet Row Pulmonary Rehab from 03/02/2022 in [facility] Cardiac and Pulmonary Rehab  Date 03/02/22       Visit Diagnosis: Pulmonary emphysema, unspecified emphysema type ([place])  Patient's Home Medications on Admission:  Current Outpatient Medications:    aspirin 81 MG tablet, Take 81 mg by mouth at bedtime., Disp: , Rfl:    atorvastatin (LIPITOR) 80 MG tablet, Take 80 mg by mouth at bedtime. (Patient not taking: Reported on 03/23/2021), Disp: , Rfl:    atorvastatin (LIPITOR) 80 MG tablet, Take 1 tablet by mouth at bedtime. (Patient not taking: Reported on 02/08/2022), Disp: , Rfl:    Cholecalciferol 25 MCG (1000 UT) capsule, Take by mouth., Disp: , Rfl:    cyanocobalamin 1000 MCG tablet, Take by mouth., Disp: , Rfl:    escitalopram (LEXAPRO) 5 MG tablet, Take 5 mg by mouth every morning., Disp: , Rfl:    famotidine (PEPCID) 10 MG tablet, Take 10 mg by mouth daily., Disp: , Rfl:    fexofenadine (ALLEGRA) 180 MG tablet, Take 180 mg by mouth at bedtime., Disp: , Rfl:    fluticasone (FLONASE) 50 MCG/ACT nasal spray, Place 2 sprays into both nostrils at bedtime. (Patient not taking: Reported on 02/08/2022), Disp: , Rfl:    fluticasone (FLONASE) 50 MCG/ACT nasal spray, Place 2 sprays into both nostrils daily., Disp: , Rfl:    fluticasone-salmeterol (ADVAIR) 250-50 MCG/ACT AEPB, Advair Diskus 250 mcg-50 mcg/dose powder for inhalation (Patient not taking: Reported on 02/08/2022), Disp: , Rfl:    Fluticasone-Salmeterol (ADVAIR) 250-50 MCG/DOSE AEPB, Inhale 1 puff into the lungs 2 (two) times daily., Disp: , Rfl:    glipiZIDE (GLUCOTROL) 10 MG tablet, Take 10 mg by mouth daily before breakfast., Disp: , Rfl:     hydrochlorothiazide (HYDRODIURIL) 25 MG tablet, Take 25 mg by mouth daily., Disp: , Rfl:    hydrochlorothiazide (HYDRODIURIL) 25 MG tablet, Take 1 tablet by mouth daily. (Patient not taking: Reported on 03/23/2021), Disp: , Rfl:    icosapent Ethyl (VASCEPA) 1 g capsule, Take 2 g by mouth 2 (two) times daily., Disp: , Rfl:    isosorbide mononitrate (IMDUR) 30 MG 24 hr tablet, Take 30 mg by mouth daily. (Patient not taking: Reported on 02/08/2022), Disp: , Rfl:    isosorbide mononitrate (IMDUR) 30 MG 24 hr tablet, Take 1 tablet by mouth daily., Disp: , Rfl:    losartan (COZAAR) 100 MG tablet, Take 1 tablet by mouth daily., Disp: , Rfl:    losartan (COZAAR) 50 MG tablet, Take 100 mg by mouth daily. (Patient not taking: Reported on 02/08/2022), Disp: , Rfl:    metFORMIN (GLUCOPHAGE) 1000 MG tablet, Take 1,000 mg by mouth 2 (two) times daily., Disp: , Rfl:    metoprolol succinate (TOPROL-XL) 100 MG 24 hr tablet, Take 50 mg by mouth daily. Take with or immediately following a meal. (Patient not taking: Reported on 03/23/2021), Disp: , Rfl:    metoprolol succinate (TOPROL-XL) 25 MG 24 hr tablet, Take 1 tablet by mouth daily., Disp: , Rfl:    Multiple Vitamins-Minerals (PRESERVISION AREDS 2) CAPS, Take 1 capsule by mouth at bedtime., Disp: , Rfl:    OXYGEN, Inhale 2.5-3 L into the lungs See admin instructions. Used while  sleeping and physical activity, Disp: , Rfl:    Polyethyl Glycol-Propyl Glycol 0.4-0.3 % SOLN, Place 1 drop into both eyes See admin instructions. Instill 1 drop into both eyes at night, may use during the day as needed for dry eyes, Disp: , Rfl:    rosuvastatin (CRESTOR) 10 MG tablet, Take 10 mg by mouth every other day., Disp: , Rfl:    tiotropium (SPIRIVA) 18 MCG inhalation capsule, Place 18 mcg into inhaler and inhale daily., Disp: , Rfl:    TRELEGY ELLIPTA 100-62.5-25 MCG/ACT AEPB, SMARTSIG:1 inhalation Via Inhaler Daily, Disp: , Rfl:   Past Medical History: Past Medical History:   Diagnosis Date   Acute MI (HCC)    CHF (congestive heart failure) (HCC)    COPD (chronic obstructive pulmonary disease) (HCC)    Coronary artery disease    Diabetes mellitus without complication (HCC)    GERD (gastroesophageal reflux disease)    History of hiatal hernia    Hypertension     Tobacco Use: Social History   Tobacco Use  Smoking Status Former   Packs/day: 1.00   Years: 35.00   Total pack years: 35.00   Types: Cigarettes   Quit date: 1995   Years since quitting: 28.4  Smokeless Tobacco Never    Labs: Review Flowsheet       Latest Ref Rng & Units 10/25/2016  Labs for ITP Cardiac and Pulmonary Rehab  Hemoglobin A1c 4.8 - 5.6 % 7.0      Pulmonary Assessment Scores:  Pulmonary Assessment Scores     Row Name 03/02/22 [phone]         ADL UCSD   ADL Phase Entry     SOB Score total 55     Rest 2     Walk 2     Stairs 4     Bath 2     Dress 1     Shop 3       CAT Score   CAT Score 18       mMRC Score   mMRC Score 3              UCSD: Self-administered rating of dyspnea associated with activities of daily living (ADLs) 6-point scale (0 = "not at all" to 5 = "maximal or unable to do because of breathlessness")  Scoring Scores range from 0 to 120.  Minimally important difference is 5 units  CAT: CAT can identify the health impairment of COPD patients and is better correlated with disease progression.  CAT has a scoring range of zero to 40. The CAT score is classified into four groups of low (less than 10), medium (10 - 20), high (21-30) and very high (31-40) based on the impact level of disease on health status. A CAT score over 10 suggests significant symptoms.  A worsening CAT score could be explained by an exacerbation, poor medication adherence, poor inhaler technique, or progression of COPD or comorbid conditions.  CAT MCID is 2 points  mMRC: mMRC (Modified Medical Research Council) Dyspnea Scale is used to assess the degree of baseline  functional disability in patients of respiratory disease due to dyspnea. No minimal important difference is established. A decrease in score of 1 point or greater is considered a positive change.   Pulmonary Function Assessment:  Pulmonary Function Assessment - 02/08/22 1015       Breath   Shortness of Breath Yes;Limiting activity             Exercise Target  Goals: Exercise Program Goal: Individual exercise prescription set using results from initial 6 min walk test and THRR while considering  patient's activity barriers and safety.   Exercise Prescription Goal: Initial exercise prescription builds to 30-45 minutes a day of aerobic activity, 2-3 days per week.  Home exercise guidelines will be given to patient during program as part of exercise prescription that the participant will acknowledge.  Education: Aerobic Exercise: - Group verbal and visual presentation on the components of exercise prescription. Introduces F.I.T.T principle from ACSM for exercise prescriptions.  Reviews F.I.T.T. principles of aerobic exercise including progression. Written material given at graduation. Flowsheet Row Pulmonary Rehab from 06/24/2021 in [facility] Cardiac and Pulmonary Rehab  Date 06/03/21  Educator [facility]  Instruction Review Code 1- Verbalizes Understanding       Education: Resistance Exercise: - Group verbal and visual presentation on the components of exercise prescription. Introduces F.I.T.T principle from ACSM for exercise prescriptions  Reviews F.I.T.T. principles of resistance exercise including progression. Written material given at graduation. Flowsheet Row Pulmonary Rehab from 06/24/2021 in [facility] Cardiac and Pulmonary Rehab  Date 04/08/21  Educator [facility]  Instruction Review Code 1- Verbalizes Understanding        Education: Exercise & Equipment Safety: - Individual verbal instruction and demonstration of equipment use and safety with use of the equipment. Flowsheet Row Pulmonary Rehab  from 03/24/2022 in [facility] Cardiac and Pulmonary Rehab  Date 02/08/22  Educator [facility]  Instruction Review Code 1- Verbalizes Understanding       Education: Exercise Physiology & General Exercise Guidelines: - Group verbal and written instruction with models to review the exercise physiology of the cardiovascular system and associated critical values. Provides general exercise guidelines with specific guidelines to those with heart or lung disease.  Flowsheet Row Pulmonary Rehab from 06/24/2021 in [facility] Cardiac and Pulmonary Rehab  Date 05/27/21  Educator [facility]  Instruction Review Code 1- Verbalizes Understanding       Education: Flexibility, Balance, Mind/Body Relaxation: - Group verbal and visual presentation with interactive activity on the components of exercise prescription. Introduces F.I.T.T principle from ACSM for exercise prescriptions. Reviews F.I.T.T. principles of flexibility and balance exercise training including progression. Also discusses the mind body connection.  Reviews various relaxation techniques to help reduce and manage stress (i.e. Deep breathing, progressive muscle relaxation, and visualization). Balance handout provided to take home. Written material given at graduation. Flowsheet Row Pulmonary Rehab from 06/24/2021 in [facility] Cardiac and Pulmonary Rehab  Date 06/17/21  Educator [facility]  Instruction Review Code 1- Verbalizes Understanding       Activity Barriers & Risk Stratification:  Activity Barriers & Cardiac Risk Stratification - 03/02/22 1355       Activity Barriers & Cardiac Risk Stratification   Activity Barriers Shortness of Breath;Muscular Weakness             6 Minute Walk:  6 Minute Walk     Row Name 03/02/22 1355         6 Minute Walk   Phase Initial     Distance 1130 feet     Walk Time 6 minutes     # of Rest Breaks 0     MPH 2.14     METS 2.22     RPE 13     Perceived Dyspnea  3     VO2 Peak 7.77     Symptoms Yes (comment)     Comments  SOB     Resting HR 75 bpm     Resting  BP 134/72     Resting Oxygen Saturation  92 %     Exercise Oxygen Saturation  during 6 min walk 83 %     Max Ex. HR 96 bpm     Max Ex. BP 150/72     2 Minute Post BP 128/70       Interval HR   1 Minute HR 87     2 Minute HR 93     3 Minute HR 94     4 Minute HR 95     5 Minute HR 96     6 Minute HR 94     2 Minute Post HR 70     Interval Heart Rate? Yes       Interval Oxygen   Interval Oxygen? Yes     Baseline Oxygen Saturation % 92 %  RA: 88%     1 Minute Oxygen Saturation % 89 %     1 Minute Liters of Oxygen 4 L  pulsed     2 Minute Oxygen Saturation % 85 %     2 Minute Liters of Oxygen 4 L     3 Minute Oxygen Saturation % 85 %     3 Minute Liters of Oxygen 4 L     4 Minute Oxygen Saturation % 83 %     4 Minute Liters of Oxygen 4 L     5 Minute Oxygen Saturation % 85 %     5 Minute Liters of Oxygen 4 L     6 Minute Oxygen Saturation % 83 %     6 Minute Liters of Oxygen 4 L     2 Minute Post Oxygen Saturation % 94 %     2 Minute Post Liters of Oxygen 4 L             Oxygen Initial Assessment:  Oxygen Initial Assessment - 03/02/22 0950       Home Oxygen   Home Oxygen Device Portable Concentrator;E-Tanks;Home Concentrator    Sleep Oxygen Prescription Continuous    Liters per minute 3    Home Exercise Oxygen Prescription Continuous    Liters per minute 3    Home Resting Oxygen Prescription Continuous    Liters per minute 0   Uses PRN- encouraged to use all times above 88%   Compliance with Home Oxygen Use Yes      Initial 6 min Walk   Oxygen Used Pulsed    Liters per minute 4      Program Oxygen Prescription   Program Oxygen Prescription Continuous    Liters per minute 2   Reaching out to MD for increase in O2 for prescription     Intervention   Short Term Goals To learn and demonstrate proper pursed lip breathing techniques or other breathing techniques. ;To learn and exhibit compliance with exercise, home and  travel O2 prescription;To learn and understand importance of monitoring SPO2 with pulse oximeter and demonstrate accurate use of the pulse oximeter.;To learn and understand importance of maintaining oxygen saturations>88%;To learn and demonstrate proper use of respiratory medications    Long  Term Goals Exhibits compliance with exercise, home  and travel O2 prescription;Verbalizes importance of monitoring SPO2 with pulse oximeter and return demonstration;Maintenance of O2 saturations>88%;Exhibits proper breathing techniques, such as pursed lip breathing or other method taught during program session;Compliance with respiratory medication;Demonstrates proper use of MDI's             Oxygen Re-Evaluation:  Oxygen  Re-Evaluation     Row Name 03/12/22 0945             Program Oxygen Prescription   Program Oxygen Prescription Continuous       Liters per minute 2         Home Oxygen   Home Oxygen Device Portable Concentrator;E-Tanks;Home Concentrator       Sleep Oxygen Prescription Continuous       Liters per minute 3       Home Exercise Oxygen Prescription Continuous       Liters per minute 3       Home Resting Oxygen Prescription Continuous       Liters per minute 0       Compliance with Home Oxygen Use Yes         Goals/Expected Outcomes   Short Term Goals To learn and demonstrate proper pursed lip breathing techniques or other breathing techniques.        Long  Term Goals Exhibits proper breathing techniques, such as pursed lip breathing or other method taught during program session       Comments Reviewed PLB technique with pt.  Talked about how it works and it's importance in maintaining their exercise saturations.       Goals/Expected Outcomes Short: Become more profiecient at using PLB.   Long: Become independent at using PLB.                Oxygen Discharge (Final Oxygen Re-Evaluation):  Oxygen Re-Evaluation - 03/12/22 0945       Program Oxygen Prescription   Program  Oxygen Prescription Continuous    Liters per minute 2      Home Oxygen   Home Oxygen Device Portable Concentrator;E-Tanks;Home Concentrator    Sleep Oxygen Prescription Continuous    Liters per minute 3    Home Exercise Oxygen Prescription Continuous    Liters per minute 3    Home Resting Oxygen Prescription Continuous    Liters per minute 0    Compliance with Home Oxygen Use Yes      Goals/Expected Outcomes   Short Term Goals To learn and demonstrate proper pursed lip breathing techniques or other breathing techniques.     Long  Term Goals Exhibits proper breathing techniques, such as pursed lip breathing or other method taught during program session    Comments Reviewed PLB technique with pt.  Talked about how it works and it's importance in maintaining their exercise saturations.    Goals/Expected Outcomes Short: Become more profiecient at using PLB.   Long: Become independent at using PLB.             Initial Exercise Prescription:  Initial Exercise Prescription - 03/02/22 1300       Date of Initial Exercise RX and Referring Provider   Date 03/02/22    Referring Provider Ned Clines MD      Oxygen   Oxygen Continuous    Liters 2   Sending request to doctor to increase O2 Rx   Maintain Oxygen Saturation 88% or higher      Treadmill   MPH 1.8    Grade 0    Minutes 15    METs 2.38      Recumbant Bike   Level 1    RPM 60    Watts 10    Minutes 15    METs 2.2      REL-XR   Level 1    Speed 50  Minutes 15    METs 2.2      T5 Nustep   Level 1    SPM 80    Minutes 15    METs 2.2      Prescription Details   Frequency (times per week) 3    Duration Progress to 30 minutes of continuous aerobic without signs/symptoms of physical distress      Intensity   THRR 40-80% of Max Heartrate 101 - 127    Ratings of Perceived Exertion 11-13    Perceived Dyspnea 0-4      Progression   Progression Continue to progress workloads to maintain intensity without  signs/symptoms of physical distress.      Resistance Training   Training Prescription Yes    Weight 3 lb    Reps 10-15             Perform Capillary Blood Glucose checks as needed.  Exercise Prescription Changes:   Exercise Prescription Changes     Row Name 03/02/22 1300 03/17/22 0700           Response to Exercise   Blood Pressure (Admit) 132/72 124/64      Blood Pressure (Exercise) 150/72 136/74      Blood Pressure (Exit) 128/70 102/60      Heart Rate (Admit) 75 bpm 86 bpm      Heart Rate (Exercise) 96 bpm 90 bpm      Heart Rate (Exit) 70 bpm 85 bpm      Oxygen Saturation (Admit) 92 % 90 %      Oxygen Saturation (Exercise) 83 % 88 %      Oxygen Saturation (Exit) 94 % 91 %      Rating of Perceived Exertion (Exercise) 13 11      Perceived Dyspnea (Exercise) 3 --      Symptoms SOB --      Comments walk test results first day of exercise      Duration -- Progress to 30 minutes of  aerobic without signs/symptoms of physical distress      Intensity -- THRR unchanged        Progression   Progression -- Continue to progress workloads to maintain intensity without signs/symptoms of physical distress.      Average METs -- 2.23        Resistance Training   Training Prescription -- Yes      Weight -- 3 lb      Reps -- 10-15        Oxygen   Oxygen -- Continuous      Liters -- 4        Treadmill   MPH -- 1.6      Grade -- 0      Minutes -- 15      METs -- 2.23        REL-XR   Level -- 1      Minutes -- 15        Oxygen   Maintain Oxygen Saturation -- 88% or higher               Exercise Comments:   Exercise Comments     Row Name 03/12/22 0943           Exercise Comments First full day of exercise!  Patient was oriented to gym and equipment including functions, settings, policies, and procedures.  Patient's individual exercise prescription and treatment plan were reviewed.  All starting workloads were established based on the results of  the 6  minute walk test done at initial orientation visit.  The plan for exercise progression was also introduced and progression will be customized based on patient's performance and goals.                Exercise Goals and Review:   Exercise Goals     Row Name 03/02/22 1402             Exercise Goals   Increase Physical Activity Yes       Intervention Provide advice, education, support and counseling about physical activity/exercise needs.;Develop an individualized exercise prescription for aerobic and resistive training based on initial evaluation findings, risk stratification, comorbidities and participant's personal goals.       Expected Outcomes Short Term: Attend rehab on a regular basis to increase amount of physical activity.;Long Term: Add in home exercise to make exercise part of routine and to increase amount of physical activity.;Long Term: Exercising regularly at least 3-5 days a week.       Increase Strength and Stamina Yes       Intervention Provide advice, education, support and counseling about physical activity/exercise needs.;Develop an individualized exercise prescription for aerobic and resistive training based on initial evaluation findings, risk stratification, comorbidities and participant's personal goals.       Expected Outcomes Short Term: Increase workloads from initial exercise prescription for resistance, speed, and METs.;Short Term: Perform resistance training exercises routinely during rehab and add in resistance training at home;Long Term: Improve cardiorespiratory fitness, muscular endurance and strength as measured by increased METs and functional capacity (6MWT)       Able to understand and use rate of perceived exertion (RPE) scale Yes       Intervention Provide education and explanation on how to use RPE scale       Expected Outcomes Short Term: Able to use RPE daily in rehab to express subjective intensity level;Long Term:  Able to use RPE to guide  intensity level when exercising independently       Able to understand and use Dyspnea scale Yes       Intervention Provide education and explanation on how to use Dyspnea scale       Expected Outcomes Short Term: Able to use Dyspnea scale daily in rehab to express subjective sense of shortness of breath during exertion;Long Term: Able to use Dyspnea scale to guide intensity level when exercising independently       Knowledge and understanding of Target Heart Rate Range (THRR) Yes       Intervention Provide education and explanation of THRR including how the numbers were predicted and where they are located for reference       Expected Outcomes Short Term: Able to state/look up THRR;Short Term: Able to use daily as guideline for intensity in rehab;Long Term: Able to use THRR to govern intensity when exercising independently       Able to check pulse independently Yes       Intervention Provide education and demonstration on how to check pulse in carotid and radial arteries.;Review the importance of being able to check your own pulse for safety during independent exercise       Expected Outcomes Short Term: Able to explain why pulse checking is important during independent exercise;Long Term: Able to check pulse independently and accurately       Understanding of Exercise Prescription Yes       Intervention Provide education, explanation, and written materials on patient's individual exercise prescription  Expected Outcomes Short Term: Able to explain program exercise prescription;Long Term: Able to explain home exercise prescription to exercise independently                Exercise Goals Re-Evaluation :  Exercise Goals Re-Evaluation     Row Name 03/12/22 0943 03/17/22 0730           Exercise Goal Re-Evaluation   Exercise Goals Review Able to understand and use rate of perceived exertion (RPE) scale;Able to understand and use Dyspnea scale;Knowledge and understanding of Target Heart  Rate Range (THRR);Understanding of Exercise Prescription Understanding of Exercise Prescription;Increase Physical Activity;Increase Strength and Stamina      Comments Reviewed RPE and dyspnea scales, THR and program prescription with pt today.  Pt voiced understanding and was given a copy of goals to take home. Saige is doing well to start rehab. He completed his first day of exercise. He did well on the treadmill and XR. His doctor also prescribed him 4 liters of oxygen during exercise. We will continue to monitor Waylon's progress in the program.      Expected Outcomes Short: Use RPE daily to regulate intensity. Long: Follow program prescription in THR. Short: Continue to come to rehab. Long: Continue to increase strength and stamina.               Discharge Exercise Prescription (Final Exercise Prescription Changes):  Exercise Prescription Changes - 03/17/22 0700       Response to Exercise   Blood Pressure (Admit) 124/64    Blood Pressure (Exercise) 136/74    Blood Pressure (Exit) 102/60    Heart Rate (Admit) 86 bpm    Heart Rate (Exercise) 90 bpm    Heart Rate (Exit) 85 bpm    Oxygen Saturation (Admit) 90 %    Oxygen Saturation (Exercise) 88 %    Oxygen Saturation (Exit) 91 %    Rating of Perceived Exertion (Exercise) 11    Comments first day of exercise    Duration Progress to 30 minutes of  aerobic without signs/symptoms of physical distress    Intensity THRR unchanged      Progression   Progression Continue to progress workloads to maintain intensity without signs/symptoms of physical distress.    Average METs 2.23      Resistance Training   Training Prescription Yes    Weight 3 lb    Reps 10-15      Oxygen   Oxygen Continuous    Liters 4      Treadmill   MPH 1.6    Grade 0    Minutes 15    METs 2.23      REL-XR   Level 1    Minutes 15      Oxygen   Maintain Oxygen Saturation 88% or higher             Nutrition:  Target Goals: Understanding of  nutrition guidelines, daily intake of sodium 1500mg , cholesterol 200mg , calories 30% from fat and 7% or less from saturated fats, daily to have 5 or more servings of fruits and vegetables.  Education: All About Nutrition: -Group instruction provided by verbal, written material, interactive activities, discussions, models, and posters to present general guidelines for heart healthy nutrition including fat, fiber, MyPlate, the role of sodium in heart healthy nutrition, utilization of the nutrition label, and utilization of this knowledge for meal planning. Follow up email sent as well. Written material given at graduation. Flowsheet Row Pulmonary Rehab from 06/24/2021 in [facility]  Cardiac and Pulmonary Rehab  Date 04/22/21  Educator [facility]  Instruction Review Code 1- Verbalizes Understanding       Biometrics:  Pre Biometrics - 03/02/22 1355       Pre Biometrics   Height 6' (1.829 m)    Weight 203 lb 14.4 oz (92.5 kg)    BMI (Calculated) 27.65    Single Leg Stand 1.37 seconds              Nutrition Therapy Plan and Nutrition Goals:  Nutrition Therapy & Goals - 03/02/22 0955       Intervention Plan   Intervention Prescribe, educate and counsel regarding individualized specific dietary modifications aiming towards targeted core components such as weight, hypertension, lipid management, diabetes, heart failure and other comorbidities.    Expected Outcomes Short Term Goal: Understand basic principles of dietary content, such as calories, fat, sodium, cholesterol and nutrients.;Short Term Goal: A plan has been developed with personal nutrition goals set during dietitian appointment.;Long Term Goal: Adherence to prescribed nutrition plan.             Nutrition Assessments:  MEDIFICTS Score Key: ?70 Need to make dietary changes  40-70 Heart Healthy Diet ? 40 Therapeutic Level Cholesterol Diet  Flowsheet Row Pulmonary Rehab from 03/02/2022 in [facility] Cardiac and Pulmonary Rehab  Picture  Your Plate Total Score on Admission 60      Picture Your Plate Scores: <40 Unhealthy dietary pattern with much room for improvement. 41-50 Dietary pattern unlikely to meet recommendations for good health and room for improvement. 51-60 More healthful dietary pattern, with some room for improvement.  >60 Healthy dietary pattern, although there may be some specific behaviors that could be improved.   Nutrition Goals Re-Evaluation:   Nutrition Goals Discharge (Final Nutrition Goals Re-Evaluation):   Psychosocial: Target Goals: Acknowledge presence or absence of significant depression and/or stress, maximize coping skills, provide positive support system. Participant is able to verbalize types and ability to use techniques and skills needed for reducing stress and depression.   Education: Stress, Anxiety, and Depression - Group verbal and visual presentation to define topics covered.  Reviews how body is impacted by stress, anxiety, and depression.  Also discusses healthy ways to reduce stress and to treat/manage anxiety and depression.  Written material given at graduation. Flowsheet Row Pulmonary Rehab from 06/24/2021 in [facility] Cardiac and Pulmonary Rehab  Date 05/20/21  Educator [facility]  Instruction Review Code 1- Verbalizes Understanding       Education: Sleep Hygiene -Provides group verbal and written instruction about how sleep can affect your health.  Define sleep hygiene, discuss sleep cycles and impact of sleep habits. Review good sleep hygiene tips.    Initial Review & Psychosocial Screening:  Initial Psych Review & Screening - 02/08/22 1017       Initial Review   Current issues with Current Psychotropic Meds      Family Dynamics   Good Support System? Yes    Comments Orlondo has a great support system. His wife, daughter that lives down the street and his other sons call him and visit. He has a positive outlook on his health. He has done the program a few times and is looking  to get into shape for his breathing.      Barriers   Psychosocial barriers to participate in program The patient should benefit from training in stress management and relaxation.      Screening Interventions   Interventions To provide support and resources with identified psychosocial  needs;Provide feedback about the scores to participant;Encouraged to exercise    Expected Outcomes Short Term goal: Utilizing psychosocial counselor, staff and physician to assist with identification of specific Stressors or current issues interfering with healing process. Setting desired goal for each stressor or current issue identified.;Long Term Goal: Stressors or current issues are controlled or eliminated.;Short Term goal: Identification and review with participant of any Quality of Life or Depression concerns found by scoring the questionnaire.;Long Term goal: The participant improves quality of Life and PHQ9 Scores as seen by post scores and/or verbalization of changes             Quality of Life Scores:  Scores of 19 and below usually indicate a poorer quality of life in these areas.  A difference of  2-3 points is a clinically meaningful difference.  A difference of 2-3 points in the total score of the Quality of Life Index has been associated with significant improvement in overall quality of life, self-image, physical symptoms, and general health in studies assessing change in quality of life.  PHQ-9: Review Flowsheet       03/02/2022 06/17/2021 04/29/2021 03/30/2021  Depression screen PHQ 2/9  Decreased Interest 0 1 0 0  Down, Depressed, Hopeless 0 0 0 0  PHQ - 2 Score 0 1 0 0  Altered sleeping 0 0 1 2  Tired, decreased energy 1 2 0 3  Change in appetite 1 0 0 1  Feeling bad or failure about yourself  0 0 0 0  Trouble concentrating 0 Moving slowly or fidgety/restless 0 0 0 2  Suicidal thoughts 0 0 0 0  PHQ-9 Score Difficult doing work/chores Not difficult at all Not difficult  at all Not difficult at all Somewhat difficult   Interpretation of Total Score  Total Score Depression Severity:  1-4 = Minimal depression, 5-9 = Mild depression, 10-14 = Moderate depression, 15-19 = Moderately severe depression, 20-27 = Severe depression   Psychosocial Evaluation and Intervention:  Psychosocial Evaluation - 02/08/22 1019       Psychosocial Evaluation & Interventions   Interventions Encouraged to exercise with the program and follow exercise prescription;Stress management education;Relaxation education    Comments Aydyn has a great support system. His wife, daughter that lives down the street and his other sons call him and visit. He has a positive outlook on his health. He has done the program a few times and is looking to get into shape for his breathing.    Expected Outcomes Short: Start [facility] to help with mood. Long: Maintain a healthy mental state    Continue Psychosocial Services  Follow up required by staff             Psychosocial Re-Evaluation:   Psychosocial Discharge (Final Psychosocial Re-Evaluation):   Education: Education Goals: Education classes will be provided on a weekly basis, covering required topics. Participant will state understanding/return demonstration of topics presented.  Learning Barriers/Preferences:  Learning Barriers/Preferences - 02/08/22 1017       Learning Barriers/Preferences   Learning Barriers None    Learning Preferences None             General Pulmonary Education Topics:  Infection Prevention: - Provides verbal and written material to individual with discussion of infection control including proper hand washing and proper equipment cleaning during exercise session. Flowsheet Row Pulmonary Rehab from 03/24/2022 in [facility] Cardiac and Pulmonary Rehab  Date 02/08/22  Educator [facility]  Instruction Review  Code 1- Verbalizes Understanding       Falls Prevention: - Provides verbal and written material to  individual with discussion of falls prevention and safety. Flowsheet Row Pulmonary Rehab from 03/24/2022 in [facility] Cardiac and Pulmonary Rehab  Date 02/08/22  Educator [facility]  Instruction Review Code 1- Verbalizes Understanding       Chronic Lung Disease Review: - Group verbal instruction with posters, models, PowerPoint presentations and videos,  to review new updates, new respiratory medications, new advancements in procedures and treatments. Providing information on websites and "800" numbers for continued self-education. Includes information about supplement oxygen, available portable oxygen systems, continuous and intermittent flow rates, oxygen safety, concentrators, and Medicare reimbursement for oxygen. Explanation of Pulmonary Drugs, including class, frequency, complications, importance of spacers, rinsing mouth after steroid MDI's, and proper cleaning methods for nebulizers. Review of basic lung anatomy and physiology related to function, structure, and complications of lung disease. Review of risk factors. Discussion about methods for diagnosing sleep apnea and types of masks and machines for OSA. Includes a review of the use of types of environmental controls: home humidity, furnaces, filters, dust mite/pet prevention, HEPA vacuums. Discussion about weather changes, air quality and the benefits of nasal washing. Instruction on Warning signs, infection symptoms, calling MD promptly, preventive modes, and value of vaccinations. Review of effective airway clearance, coughing and/or vibration techniques. Emphasizing that all should Create an Action Plan. Written material given at graduation. Flowsheet Row Pulmonary Rehab from 03/24/2022 in [facility] Cardiac and Pulmonary Rehab  Education need identified 03/02/22  Date 03/24/22  Educator [facility]  Instruction Review Code 1- Verbalizes Understanding       AED/CPR: - Group verbal and written instruction with the use of models to demonstrate the basic use of  the AED with the basic ABC's of resuscitation.    Anatomy and Cardiac Procedures: - Group verbal and visual presentation and models provide information about basic cardiac anatomy and function. Reviews the testing methods done to diagnose heart disease and the outcomes of the test results. Describes the treatment choices: Medical Management, Angioplasty, or Coronary Bypass Surgery for treating various heart conditions including Myocardial Infarction, Angina, Valve Disease, and Cardiac Arrhythmias.  Written material given at graduation. Flowsheet Row Pulmonary Rehab from 06/24/2021 in [facility] Cardiac and Pulmonary Rehab  Date 04/08/21  Educator SB  Instruction Review Code 1- Verbalizes Understanding       Medication Safety: - Group verbal and visual instruction to review commonly prescribed medications for heart and lung disease. Reviews the medication, class of the drug, and side effects. Includes the steps to properly store meds and maintain the prescription regimen.  Written material given at graduation. Flowsheet Row Pulmonary Rehab from 06/24/2021 in [facility] Cardiac and Pulmonary Rehab  Date 06/24/21  Educator SB  Instruction Review Code 1- Verbalizes Understanding       Other: -Provides group and verbal instruction on various topics (see comments)   Knowledge Questionnaire Score:  Knowledge Questionnaire Score - 03/02/22 1404       Knowledge Questionnaire Score   Pre Score 15/18              Core Components/Risk Factors/Patient Goals at Admission:  Personal Goals and Risk Factors at Admission - 03/02/22 1402       Core Components/Risk Factors/Patient Goals on Admission    Weight Management Yes;Weight Loss    Intervention Weight Management: Develop a combined nutrition and exercise program designed to reach desired caloric intake, while maintaining appropriate intake of nutrient and fiber,  sodium and fats, and appropriate energy expenditure required for the weight  goal.;Weight Management: Provide education and appropriate resources to help participant work on and attain dietary goals.;Weight Management/Obesity: Establish reasonable short term and long term weight goals.    Admit Weight 203 lb (92.1 kg)    Goal Weight: Short Term 198 lb (89.8 kg)    Goal Weight: Long Term 193 lb (87.5 kg)    Expected Outcomes Short Term: Continue to assess and modify interventions until short term weight is achieved;Long Term: Adherence to nutrition and physical activity/exercise program aimed toward attainment of established weight goal;Weight Loss: Understanding of general recommendations for a balanced deficit meal plan, which promotes 1-2 lb weight loss per week and includes a negative energy balance of [phone] kcal/d;Understanding recommendations for meals to include 15-35% energy as protein, 25-35% energy from fat, 35-60% energy from carbohydrates, less than 200mg  of dietary cholesterol, 20-35 gm of total fiber daily;Understanding of distribution of calorie intake throughout the day with the consumption of 4-5 meals/snacks    Improve shortness of breath with ADL's Yes    Intervention Provide education, individualized exercise plan and daily activity instruction to help decrease symptoms of SOB with activities of daily living.    Expected Outcomes Short Term: Improve cardiorespiratory fitness to achieve a reduction of symptoms when performing ADLs;Long Term: Be able to perform more ADLs without symptoms or delay the onset of symptoms    Diabetes Yes    Intervention Provide education about signs/symptoms and action to take for hypo/hyperglycemia.;Provide education about proper nutrition, including hydration, and aerobic/resistive exercise prescription along with prescribed medications to achieve blood glucose in normal ranges: Fasting glucose 65-99 mg/dL    Expected Outcomes Short Term: Participant verbalizes understanding of the signs/symptoms and immediate care of  hyper/hypoglycemia, proper foot care and importance of medication, aerobic/resistive exercise and nutrition plan for blood glucose control.;Long Term: Attainment of HbA1C < 7%.    Heart Failure Yes    Intervention Provide a combined exercise and nutrition program that is supplemented with education, support and counseling about heart failure. Directed toward relieving symptoms such as shortness of breath, decreased exercise tolerance, and extremity edema.    Expected Outcomes Improve functional capacity of life;Short term: Attendance in program 2-3 days a week with increased exercise capacity. Reported lower sodium intake. Reported increased fruit and vegetable intake. Reports medication compliance.;Short term: Daily weights obtained and reported for increase. Utilizing diuretic protocols set by physician.;Long term: Adoption of self-care skills and reduction of barriers for early signs and symptoms recognition and intervention leading to self-care maintenance.    Hypertension Yes    Intervention Provide education on lifestyle modifcations including regular physical activity/exercise, weight management, moderate sodium restriction and increased consumption of fresh fruit, vegetables, and low fat dairy, alcohol moderation, and smoking cessation.;Monitor prescription use compliance.    Expected Outcomes Short Term: Continued assessment and intervention until BP is < 140/69mm HG in hypertensive participants. < 130/76mm HG in hypertensive participants with diabetes, heart failure or chronic kidney disease.;Long Term: Maintenance of blood pressure at goal levels.    Lipids Yes    Intervention Provide education and support for participant on nutrition & aerobic/resistive exercise along with prescribed medications to achieve LDL 70mg , HDL >40mg .    Expected Outcomes Short Term: Participant states understanding of desired cholesterol values and is compliant with medications prescribed. Participant is following  exercise prescription and nutrition guidelines.;Long Term: Cholesterol controlled with medications as prescribed, with individualized exercise RX and with personalized nutrition plan. Value  goals: LDL < 70mg , HDL > 40 mg.             Education:Diabetes - Individual verbal and written instruction to review signs/symptoms of diabetes, desired ranges of glucose level fasting, after meals and with exercise. Acknowledge that pre and post exercise glucose checks will be done for 3 sessions at entry of program. Flowsheet Row Pulmonary Rehab from 03/24/2022 in [facility] Cardiac and Pulmonary Rehab  Date 02/08/22  Educator [facility]  Instruction Review Code 1- Verbalizes Understanding       Know Your Numbers and Heart Failure: - Group verbal and visual instruction to discuss disease risk factors for cardiac and pulmonary disease and treatment options.  Reviews associated critical values for Overweight/Obesity, Hypertension, Cholesterol, and Diabetes.  Discusses basics of heart failure: signs/symptoms and treatments.  Introduces Heart Failure Zone chart for action plan for heart failure.  Written material given at graduation. Flowsheet Row Pulmonary Rehab from 03/24/2022 in [facility] Cardiac and Pulmonary Rehab  Date 03/17/22  Educator SB  Instruction Review Code 1- Verbalizes Understanding       Core Components/Risk Factors/Patient Goals Review:    Core Components/Risk Factors/Patient Goals at Discharge (Final Review):    ITP Comments:  ITP Comments     Row Name 02/08/22 1026 03/02/22 0947 03/12/22 0943 03/24/22 1236     ITP Comments In patient  Visit completed. Patient informed on EP and RD appointment and 6 Minute walk test. Patient also informed of patient health questionnaires on My Chart. Patient Verbalizes understanding. Visit diagnosis can be found in [facility] 12/08/2021. Completed and gym orientation. Initial ITP created and sent for review to Dr. Vida Rigger,  Medical Director. First full day  of exercise!  Patient was oriented to gym and equipment including functions, settings, policies, and procedures.  Patient's individual exercise prescription and treatment plan were reviewed.  All starting workloads were established based on the results of the 6 minute walk test done at initial orientation visit.  The plan for exercise progression was also introduced and progression will be customized based on patient's performance and goals. 30 Day review completed. Medical Director ITP review done, changes made as directed, and signed approval by Medical Director.             Comments:

## 2022-03-24 NOTE — Progress Notes (Signed)
Daily Session Note  Patient Details  Name: Jose Ray MRN: [id number] Date of Birth: Feb 11, 1941 Referring Provider:   Flowsheet Row Pulmonary Rehab from 03/02/2022 in [facility] Cardiac and Pulmonary Rehab  Referring Provider Wallene Huh MD       Encounter Date: 03/24/2022  Check In:  Session Check In - 03/24/22 0957       Check-In   Supervising physician immediately available to respond to emergencies See telemetry face sheet for immediately available ER MD    Location [facility]-Cardiac & Pulmonary Rehab    Staff Present Heath Lark, RN, BSN, CCRP;Laureen Owens Shark, BS, RRT, CPFT;Joseph [place], [place], [place], RCEP, CCRP, CCET    Virtual Visit No    Medication changes reported     No    Fall or balance concerns reported    No    Warm-up and Cool-down Performed on first and last piece of equipment    Resistance Training Performed Yes    VAD Patient? No    PAD/SET Patient? No      Pain Assessment   Currently in Pain? No/denies                Social History   Tobacco Use  Smoking Status Former   Packs/day: 1.00   Years: 35.00   Total pack years: 35.00   Types: Cigarettes   Quit date: 1995   Years since quitting: 28.4  Smokeless Tobacco Never    Goals Met:  Proper associated with RPD/PD & O2 Sat Independence with exercise equipment Exercise tolerated well No report of concerns or symptoms today  Goals Unmet:  Not Applicable  Comments: Pt able to follow exercise prescription today without complaint.  Will continue to monitor for progression.    Dr. Emily Filbert is Medical Director for [place].  Dr. Ottie Glazier is Medical Director for [facility] Pulmonary Rehabilitation.

## 2022-03-26 ENCOUNTER — Encounter: Payer: Medicare Other | Admitting: *Deleted

## 2022-03-26 DIAGNOSIS — J439 Emphysema, unspecified: Secondary | ICD-10-CM | POA: Diagnosis not present

## 2022-03-26 NOTE — Progress Notes (Signed)
Daily Session Note  Patient Details  Name: Jose Ray MRN: [id number] Date of Birth: [birth year] Referring Provider:   Flowsheet Row Pulmonary Rehab from 03/02/2022 in [facility] Cardiac and Pulmonary Rehab  Referring Provider Wallene Huh MD       Encounter Date: 03/26/2022  Check In:  Session Check In - 03/26/22 1016       Check-In   Supervising physician immediately available to respond to emergencies See telemetry face sheet for immediately available ER MD    Location [facility]-Cardiac & Pulmonary Rehab    Staff Present Heath Lark, RN, BSN, CCRP;Jessica [place], MA, RCEP, CCRP, CCET;Joseph [place], [place]    Virtual Visit No    Medication changes reported     No    Fall or balance concerns reported    No    Warm-up and Cool-down Performed on first and last piece of equipment    Resistance Training Performed Yes    VAD Patient? No    PAD/SET Patient? No      Pain Assessment   Currently in Pain? No/denies                Social History   Tobacco Use  Smoking Status Former   Packs/day: 1.00   Years: 35.00   Total pack years: 35.00   Types: Cigarettes   Quit date: 1995   Years since quitting: 28.4  Smokeless Tobacco Never    Goals Met:  Proper associated with RPD/PD & O2 Sat Independence with exercise equipment Exercise tolerated well No report of concerns or symptoms today  Goals Unmet:  Not Applicable  Comments: Pt able to follow exercise prescription today without complaint.  Will continue to monitor for progression.    Dr. Emily Filbert is Medical Director for [place].  Dr. Ottie Glazier is Medical Director for [facility] Pulmonary Rehabilitation.

## 2022-04-07 DIAGNOSIS — J439 Emphysema, unspecified: Secondary | ICD-10-CM | POA: Diagnosis not present

## 2022-04-07 NOTE — Progress Notes (Signed)
Daily Session Note  Patient Details  Name: GOODWIN KAMPHAUS MRN: [id number] Date of Birth: [birth year] Referring Provider:   Flowsheet Row Pulmonary Rehab from 03/02/2022 in [facility] Cardiac and Pulmonary Rehab  Referring Provider Wallene Huh MD       Encounter Date: 04/07/2022  Check In:  Session Check In - 04/07/22 0926       Check-In   Supervising physician immediately available to respond to emergencies See telemetry face sheet for immediately available ER MD    Location [facility]-Cardiac & Pulmonary Rehab    Staff Present Birdie Sons, MPA, RN;Joseph Tessie Fass, RCP,RRT,BSRT;Laureen Owens Shark, BS, RRT, CPFT;[place] Amedeo Plenty, BS, ACSM CEP, Exercise Physiologist    Virtual Visit No    Medication changes reported     No    Fall or balance concerns reported    No    Warm-up and Cool-down Performed on first and last piece of equipment    Resistance Training Performed Yes    VAD Patient? No    PAD/SET Patient? No      Pain Assessment   Currently in Pain? No/denies                Social History   Tobacco Use  Smoking Status Former   Packs/day: 1.00   Years: 35.00   Total pack years: 35.00   Types: Cigarettes   Quit date: 1995   Years since quitting: 28.5  Smokeless Tobacco Never    Goals Met:  Independence with exercise equipment Exercise tolerated well No report of concerns or symptoms today Strength training completed today  Goals Unmet:  Not Applicable  Comments: Pt able to follow exercise prescription today without complaint.  Will continue to monitor for progression.    Dr. Emily Filbert is Medical Director for [place].  Dr. Ottie Glazier is Medical Director for [facility] Pulmonary Rehabilitation.

## 2022-04-09 ENCOUNTER — Encounter: Payer: Medicare Other | Admitting: *Deleted

## 2022-04-09 DIAGNOSIS — J439 Emphysema, unspecified: Secondary | ICD-10-CM

## 2022-04-09 NOTE — Progress Notes (Signed)
Daily Session Note  Patient Details  Name: Jose Ray MRN: [id number] Date of Birth: Feb 16, 1941 Referring Provider:   Flowsheet Row Pulmonary Rehab from 03/02/2022 in [facility] Cardiac and Pulmonary Rehab  Referring Provider Wallene Huh MD       Encounter Date: 04/09/2022  Check In:  Session Check In - 04/09/22 0933       Check-In   Supervising physician immediately available to respond to emergencies See telemetry face sheet for immediately available ER MD    Location [facility]-Cardiac & Pulmonary Rehab    Staff Present Heath Lark, RN, BSN, CCRP;Joseph Muddy, RCP,RRT,BSRT;Jessica [place], [place], [place], CCRP, CCET    Virtual Visit No    Medication changes reported     No    Fall or balance concerns reported    No    Warm-up and Cool-down Performed on first and last piece of equipment    Resistance Training Performed Yes    VAD Patient? No    PAD/SET Patient? No      Pain Assessment   Currently in Pain? No/denies                Social History   Tobacco Use  Smoking Status Former   Packs/day: 1.00   Years: 35.00   Total pack years: 35.00   Types: Cigarettes   Quit date: 1995   Years since quitting: 28.5  Smokeless Tobacco Never    Goals Met:  Proper associated with RPD/PD & O2 Sat Independence with exercise equipment Exercise tolerated well No report of concerns or symptoms today  Goals Unmet:  Not Applicable  Comments: Pt able to follow exercise prescription today without complaint.  Will continue to monitor for progression.    Dr. Emily Filbert is Medical Director for [place].  Dr. Ottie Glazier is Medical Director for [facility] Pulmonary Rehabilitation.

## 2022-04-12 ENCOUNTER — Encounter: Payer: Medicare Other | Attending: Specialist

## 2022-04-12 DIAGNOSIS — J439 Emphysema, unspecified: Secondary | ICD-10-CM | POA: Insufficient documentation

## 2022-04-14 DIAGNOSIS — J439 Emphysema, unspecified: Secondary | ICD-10-CM | POA: Diagnosis not present

## 2022-04-14 NOTE — Progress Notes (Signed)
Daily Session Note  Patient Details  Name: Jose Ray MRN: [id number] Date of Birth: 11-19-1940 Referring Provider:   Flowsheet Row Pulmonary Rehab from 03/02/2022 in [facility] Cardiac and Pulmonary Rehab  Referring Provider Wallene Huh MD       Encounter Date: 04/14/2022  Check In:  Session Check In - 04/14/22 0916       Check-In   Supervising physician immediately available to respond to emergencies See telemetry face sheet for immediately available ER MD    Location [facility]-Cardiac & Pulmonary Rehab    Staff Present Antionette Fairy, BS, Exercise Physiologist;Joseph [place], RCP,RRT,BSRT;Kinga Cassar [place], MPA, RN;Jessica [place], [place], RCEP, CCRP, CCET    Virtual Visit No    Medication changes reported     No    Fall or balance concerns reported    No    Warm-up and Cool-down Performed on first and last piece of equipment    Resistance Training Performed Yes    VAD Patient? No    PAD/SET Patient? No      Pain Assessment   Currently in Pain? No/denies                Social History   Tobacco Use  Smoking Status Former   Packs/day: 1.00   Years: 35.00   Total pack years: 35.00   Types: Cigarettes   Quit date: 1995   Years since quitting: 28.5  Smokeless Tobacco Never    Goals Met:  Independence with exercise equipment Exercise tolerated well No report of concerns or symptoms today Strength training completed today  Goals Unmet:  Not Applicable  Comments: Pt able to follow exercise prescription today without complaint.  Will continue to monitor for progression.    Dr. Emily Filbert is Medical Director for [place].  Dr. Ottie Glazier is Medical Director for [facility] Pulmonary Rehabilitation.

## 2022-04-16 ENCOUNTER — Encounter: Payer: Medicare Other | Admitting: *Deleted

## 2022-04-16 DIAGNOSIS — J439 Emphysema, unspecified: Secondary | ICD-10-CM | POA: Diagnosis not present

## 2022-04-16 NOTE — Progress Notes (Signed)
Daily Session Note  Patient Details  Name: Jose Ray MRN: [id number] Date of Birth: Mar 03, 1941 Referring Provider:   Flowsheet Row Pulmonary Rehab from 03/02/2022 in [facility] Cardiac and Pulmonary Rehab  Referring Provider Wallene Huh MD       Encounter Date: 04/16/2022  Check In:  Session Check In - 04/16/22 0933       Check-In   Supervising physician immediately available to respond to emergencies See telemetry face sheet for immediately available ER MD    Location [facility]-Cardiac & Pulmonary Rehab    Staff Present Heath Lark, RN, BSN, CCRP;Jessica [place], MA, RCEP, CCRP, CCET;Joseph [place], [place]    Virtual Visit No    Medication changes reported     No    Fall or balance concerns reported    No    Warm-up and Cool-down Performed on first and last piece of equipment    Resistance Training Performed Yes    VAD Patient? No    PAD/SET Patient? No      Pain Assessment   Currently in Pain? No/denies                Social History   Tobacco Use  Smoking Status Former   Packs/day: 1.00   Years: 35.00   Total pack years: 35.00   Types: Cigarettes   Quit date: 1995   Years since quitting: 28.5  Smokeless Tobacco Never    Goals Met:  Independence with exercise equipment Exercise tolerated well No report of concerns or symptoms today  Goals Unmet:  Not Applicable  Comments: Pt able to follow exercise prescription today without complaint.  Will continue to monitor for progression. O2 sats ran 86-88 on 4 L today  Reviewed home exercise with pt today.  Pt plans to walk and use staff videos at home for exercise.  Reviewed THR, pulse, RPE, sign and symptoms, pulse oximetery and when to call 911 or MD.  Also discussed weather considerations and indoor options.  Pt voiced understanding.   Dr. Emily Filbert is Medical Director for [place].  Dr. Ottie Glazier is Medical Director for [facility] Pulmonary Rehabilitation.

## 2022-04-19 ENCOUNTER — Encounter: Payer: Medicare Other | Admitting: *Deleted

## 2022-04-19 DIAGNOSIS — J439 Emphysema, unspecified: Secondary | ICD-10-CM | POA: Diagnosis not present

## 2022-04-19 NOTE — Progress Notes (Signed)
Daily Session Note  Patient Details  Name: Jose Ray MRN: [id number] Date of Birth: 11-Apr-1941 Referring Provider:   Flowsheet Row Pulmonary Rehab from 03/02/2022 in [facility] Cardiac and Pulmonary Rehab  Referring Provider Wallene Huh MD       Encounter Date: 04/19/2022  Check In:  Session Check In - 04/19/22 0959       Check-In   Supervising physician immediately available to respond to emergencies See telemetry face sheet for immediately available ER MD    Location [facility]-Cardiac & Pulmonary Rehab    Staff Present Heath Lark, RN, BSN, CCRP;Joseph [place], RCP,RRT,BSRT;Kelly [place], [place], ACSM CEP, Exercise Physiologist    Virtual Visit No    Medication changes reported     No    Fall or balance concerns reported    No    Warm-up and Cool-down Performed on first and last piece of equipment    Resistance Training Performed Yes    VAD Patient? No    PAD/SET Patient? No      Pain Assessment   Currently in Pain? No/denies                Social History   Tobacco Use  Smoking Status Former   Packs/day: 1.00   Years: 35.00   Total pack years: 35.00   Types: Cigarettes   Quit date: 1995   Years since quitting: 28.5  Smokeless Tobacco Never    Goals Met:  Proper associated with RPD/PD & O2 Sat Independence with exercise equipment Exercise tolerated well No report of concerns or symptoms today  Goals Unmet:  Not Applicable  Comments: Pt able to follow exercise prescription today without complaint.  Will continue to monitor for progression.    Dr. Emily Filbert is Medical Director for [place].  Dr. Ottie Glazier is Medical Director for [facility] Pulmonary Rehabilitation.

## 2022-04-21 ENCOUNTER — Encounter: Payer: Self-pay | Admitting: *Deleted

## 2022-04-21 DIAGNOSIS — J439 Emphysema, unspecified: Secondary | ICD-10-CM | POA: Diagnosis not present

## 2022-04-21 NOTE — Progress Notes (Signed)
Pulmonary Individual Treatment Plan  Patient Details  Name: Jose Ray MRN: [id number] Date of Birth: [birth year] Referring Provider:   Flowsheet Row Pulmonary Rehab from 03/02/2022 in [facility] Cardiac and Pulmonary Rehab  Referring Provider Wallene Huh MD       Initial Encounter Date:  Flowsheet Row Pulmonary Rehab from 03/02/2022 in [facility] Cardiac and Pulmonary Rehab  Date 03/02/22       Visit Diagnosis: Pulmonary emphysema, unspecified emphysema type ([place])  Patient's Home Medications on Admission:  Current Outpatient Medications:    aspirin 81 MG tablet, Take 81 mg by mouth at bedtime., Disp: , Rfl:    atorvastatin (LIPITOR) 80 MG tablet, Take 80 mg by mouth at bedtime. (Patient not taking: Reported on 03/23/2021), Disp: , Rfl:    atorvastatin (LIPITOR) 80 MG tablet, Take 1 tablet by mouth at bedtime. (Patient not taking: Reported on 02/08/2022), Disp: , Rfl:    Cholecalciferol 25 MCG (1000 UT) capsule, Take by mouth., Disp: , Rfl:    cyanocobalamin 1000 MCG tablet, Take by mouth., Disp: , Rfl:    escitalopram (LEXAPRO) 5 MG tablet, Take 5 mg by mouth every morning., Disp: , Rfl:    famotidine (PEPCID) 10 MG tablet, Take 10 mg by mouth daily., Disp: , Rfl:    fexofenadine (ALLEGRA) 180 MG tablet, Take 180 mg by mouth at bedtime., Disp: , Rfl:    fluticasone (FLONASE) 50 MCG/ACT nasal spray, Place 2 sprays into both nostrils at bedtime. (Patient not taking: Reported on 02/08/2022), Disp: , Rfl:    fluticasone (FLONASE) 50 MCG/ACT nasal spray, Place 2 sprays into both nostrils daily., Disp: , Rfl:    fluticasone-salmeterol (ADVAIR) 250-50 MCG/ACT AEPB, Advair Diskus 250 mcg-50 mcg/dose powder for inhalation (Patient not taking: Reported on 02/08/2022), Disp: , Rfl:    Fluticasone-Salmeterol (ADVAIR) 250-50 MCG/DOSE AEPB, Inhale 1 puff into the lungs 2 (two) times daily., Disp: , Rfl:    glipiZIDE (GLUCOTROL) 10 MG tablet, Take 10 mg by mouth daily before breakfast., Disp: , Rfl:     hydrochlorothiazide (HYDRODIURIL) 25 MG tablet, Take 25 mg by mouth daily., Disp: , Rfl:    hydrochlorothiazide (HYDRODIURIL) 25 MG tablet, Take 1 tablet by mouth daily. (Patient not taking: Reported on 03/23/2021), Disp: , Rfl:    icosapent Ethyl (VASCEPA) 1 g capsule, Take 2 g by mouth 2 (two) times daily., Disp: , Rfl:    isosorbide mononitrate (IMDUR) 30 MG 24 hr tablet, Take 30 mg by mouth daily. (Patient not taking: Reported on 02/08/2022), Disp: , Rfl:    isosorbide mononitrate (IMDUR) 30 MG 24 hr tablet, Take 1 tablet by mouth daily., Disp: , Rfl:    losartan (COZAAR) 100 MG tablet, Take 1 tablet by mouth daily., Disp: , Rfl:    losartan (COZAAR) 50 MG tablet, Take 100 mg by mouth daily. (Patient not taking: Reported on 02/08/2022), Disp: , Rfl:    metFORMIN (GLUCOPHAGE) 1000 MG tablet, Take 1,000 mg by mouth 2 (two) times daily., Disp: , Rfl:    metoprolol succinate (TOPROL-XL) 100 MG 24 hr tablet, Take 50 mg by mouth daily. Take with or immediately following a meal. (Patient not taking: Reported on 03/23/2021), Disp: , Rfl:    metoprolol succinate (TOPROL-XL) 25 MG 24 hr tablet, Take 1 tablet by mouth daily., Disp: , Rfl:    Multiple Vitamins-Minerals (PRESERVISION AREDS 2) CAPS, Take 1 capsule by mouth at bedtime., Disp: , Rfl:    OXYGEN, Inhale 2.5-3 L into the lungs See admin instructions. Used while  sleeping and physical activity, Disp: , Rfl:    Polyethyl Glycol-Propyl Glycol 0.4-0.3 % SOLN, Place 1 drop into both eyes See admin instructions. Instill 1 drop into both eyes at night, may use during the day as needed for dry eyes, Disp: , Rfl:    rosuvastatin (CRESTOR) 10 MG tablet, Take 10 mg by mouth every other day., Disp: , Rfl:    tiotropium (SPIRIVA) 18 MCG inhalation capsule, Place 18 mcg into inhaler and inhale daily., Disp: , Rfl:    TRELEGY ELLIPTA 100-62.5-25 MCG/ACT AEPB, SMARTSIG:1 inhalation Via Inhaler Daily, Disp: , Rfl:   Past Medical History: Past Medical History:   Diagnosis Date   Acute MI ([place])    CHF (congestive heart failure) (HCC)    COPD (chronic obstructive pulmonary disease) ([place])    Coronary artery disease    Diabetes mellitus without complication (HCC)    GERD (gastroesophageal reflux disease)    History of hiatal hernia    Hypertension     Tobacco Use: Social History   Tobacco Use  Smoking Status Former   Packs/day: 1.00   Years: 35.00   Total pack years: 35.00   Types: Cigarettes   Quit date: 1995   Years since quitting: 28.5  Smokeless Tobacco Never    Labs: Review Flowsheet       Latest Ref Rng & Units 10/25/2016  Labs for ITP Cardiac and Pulmonary Rehab  Hemoglobin A1c 4.8 - 5.6 % 7.0      Pulmonary Assessment Scores:  Pulmonary Assessment Scores     Row Name 03/02/22 [phone]         ADL UCSD   ADL Phase Entry     SOB Score total 55     Rest 2     Walk 2     Stairs 4     Bath 2     Dress 1     Shop 3       CAT Score   CAT Score 18       mMRC Score   mMRC Score 3              UCSD: Self-administered rating of dyspnea associated with activities of daily living (ADLs) 6-point scale (0 = "not at all" to 5 = "maximal or unable to do because of breathlessness")  Scoring Scores range from 0 to 120.  Minimally important difference is 5 units  CAT: CAT can identify the health impairment of COPD patients and is better correlated with disease progression.  CAT has a scoring range of zero to 40. The CAT score is classified into four groups of low (less than 10), medium (10 - 20), high (21-30) and very high (31-40) based on the impact level of disease on health status. A CAT score over 10 suggests significant symptoms.  A worsening CAT score could be explained by an exacerbation, poor medication adherence, poor inhaler technique, or progression of COPD or comorbid conditions.  CAT MCID is 2 points  mMRC: mMRC (Modified Medical Research Council) Dyspnea Scale is used to assess the degree of baseline  functional disability in patients of respiratory disease due to dyspnea. No minimal important difference is established. A decrease in score of 1 point or greater is considered a positive change.   Pulmonary Function Assessment:  Pulmonary Function Assessment - 02/08/22 1015       Breath   Shortness of Breath Yes;Limiting activity             Exercise Target  Goals: Exercise Program Goal: Individual exercise prescription set using results from initial 6 min walk test and THRR while considering  patient's activity barriers and safety.   Exercise Prescription Goal: Initial exercise prescription builds to 30-45 minutes a day of aerobic activity, 2-3 days per week.  Home exercise guidelines will be given to patient during program as part of exercise prescription that the participant will acknowledge.  Education: Aerobic Exercise: - Group verbal and visual presentation on the components of exercise prescription. Introduces F.I.T.T principle from ACSM for exercise prescriptions.  Reviews F.I.T.T. principles of aerobic exercise including progression. Written material given at graduation. Flowsheet Row Pulmonary Rehab from 06/24/2021 in [facility] Cardiac and Pulmonary Rehab  Date 06/03/21  Educator [facility]  Instruction Review Code 1- Verbalizes Understanding       Education: Resistance Exercise: - Group verbal and visual presentation on the components of exercise prescription. Introduces F.I.T.T principle from ACSM for exercise prescriptions  Reviews F.I.T.T. principles of resistance exercise including progression. Written material given at graduation. Flowsheet Row Pulmonary Rehab from 06/24/2021 in [facility] Cardiac and Pulmonary Rehab  Date 04/08/21  Educator [facility]  Instruction Review Code 1- Verbalizes Understanding        Education: Exercise & Equipment Safety: - Individual verbal instruction and demonstration of equipment use and safety with use of the equipment. Flowsheet Row Pulmonary Rehab  from 04/14/2022 in [facility] Cardiac and Pulmonary Rehab  Date 02/08/22  Educator [facility]  Instruction Review Code 1- Verbalizes Understanding       Education: Exercise Physiology & General Exercise Guidelines: - Group verbal and written instruction with models to review the exercise physiology of the cardiovascular system and associated critical values. Provides general exercise guidelines with specific guidelines to those with heart or lung disease.  Flowsheet Row Pulmonary Rehab from 04/14/2022 in [facility] Cardiac and Pulmonary Rehab  Date 04/07/22  Educator [facility]  Instruction Review Code 1- [employer] Understanding       Education: Flexibility, Balance, Mind/Body Relaxation: - Group verbal and visual presentation with interactive activity on the components of exercise prescription. Introduces F.I.T.T principle from ACSM for exercise prescriptions. Reviews F.I.T.T. principles of flexibility and balance exercise training including progression. Also discusses the mind body connection.  Reviews various relaxation techniques to help reduce and manage stress (i.e. Deep breathing, progressive muscle relaxation, and visualization). Balance handout provided to take home. Written material given at graduation. Flowsheet Row Pulmonary Rehab from 06/24/2021 in [facility] Cardiac and Pulmonary Rehab  Date 06/17/21  Educator [facility]  Instruction Review Code 1- Verbalizes Understanding       Activity Barriers & Risk Stratification:  Activity Barriers & Cardiac Risk Stratification - 03/02/22 1355       Activity Barriers & Cardiac Risk Stratification   Activity Barriers Shortness of Breath;Muscular Weakness             6 Minute Walk:  6 Minute Walk     Row Name 03/02/22 1355         6 Minute Walk   Phase Initial     Distance 1130 feet     Walk Time 6 minutes     # of Rest Breaks 0     MPH 2.14     METS 2.22     RPE 13     Perceived Dyspnea  3     VO2 Peak 7.77     Symptoms Yes (comment)     Comments SOB      Resting HR 75 bpm     Resting  BP 134/72     Resting Oxygen Saturation  92 %     Exercise Oxygen Saturation  during 6 min walk 83 %     Max Ex. HR 96 bpm     Max Ex. BP 150/72     2 Minute Post BP 128/70       Interval HR   1 Minute HR 87     2 Minute HR 93     3 Minute HR 94     4 Minute HR 95     5 Minute HR 96     6 Minute HR 94     2 Minute Post HR 70     Interval Heart Rate? Yes       Interval Oxygen   Interval Oxygen? Yes     Baseline Oxygen Saturation % 92 %  RA: 88%     1 Minute Oxygen Saturation % 89 %     1 Minute Liters of Oxygen 4 L  pulsed     2 Minute Oxygen Saturation % 85 %     2 Minute Liters of Oxygen 4 L     3 Minute Oxygen Saturation % 85 %     3 Minute Liters of Oxygen 4 L     4 Minute Oxygen Saturation % 83 %     4 Minute Liters of Oxygen 4 L     5 Minute Oxygen Saturation % 85 %     5 Minute Liters of Oxygen 4 L     6 Minute Oxygen Saturation % 83 %     6 Minute Liters of Oxygen 4 L     2 Minute Post Oxygen Saturation % 94 %     2 Minute Post Liters of Oxygen 4 L             Oxygen Initial Assessment:  Oxygen Initial Assessment - 03/02/22 0950       Home Oxygen   Home Oxygen Device Portable Concentrator;E-Tanks;Home Concentrator    Sleep Oxygen Prescription Continuous    Liters per minute 3    Home Exercise Oxygen Prescription Continuous    Liters per minute 3    Home Resting Oxygen Prescription Continuous    Liters per minute 0   Uses PRN- encouraged to use all times above 88%   Compliance with Home Oxygen Use Yes      Initial 6 min Walk   Oxygen Used Pulsed    Liters per minute 4      Program Oxygen Prescription   Program Oxygen Prescription Continuous    Liters per minute 2   Reaching out to MD for increase in O2 for prescription     Intervention   Short Term Goals To learn and demonstrate proper pursed lip breathing techniques or other breathing techniques. ;To learn and exhibit compliance with exercise, home and travel O2  prescription;To learn and understand importance of monitoring SPO2 with pulse oximeter and demonstrate accurate use of the pulse oximeter.;To learn and understand importance of maintaining oxygen saturations>88%;To learn and demonstrate proper use of respiratory medications    Long  Term Goals Exhibits compliance with exercise, home  and travel O2 prescription;Verbalizes importance of monitoring SPO2 with pulse oximeter and return demonstration;Maintenance of O2 saturations>88%;Exhibits proper breathing techniques, such as pursed lip breathing or other method taught during program session;Compliance with respiratory medication;Demonstrates proper use of MDI's             Oxygen Re-Evaluation:  Oxygen  Re-Evaluation     Row Name 03/12/22 0945 04/07/22 0933           Program Oxygen Prescription   Program Oxygen Prescription Continuous None      Liters per minute 2 3        Home Oxygen   Home Oxygen Device Portable Concentrator;E-Tanks;Home Concentrator Portable Concentrator;E-Tanks;Home Concentrator      Sleep Oxygen Prescription Continuous Continuous      Liters per minute 3 3      Home Exercise Oxygen Prescription Continuous Continuous      Liters per minute 3 3      Home Resting Oxygen Prescription Continuous None      Liters per minute 0 0      Compliance with Home Oxygen Use Yes Yes        Goals/Expected Outcomes   Short Term Goals To learn and demonstrate proper pursed lip breathing techniques or other breathing techniques.  To learn and understand importance of maintaining oxygen saturations>88%;To learn and understand importance of monitoring SPO2 with pulse oximeter and demonstrate accurate use of the pulse oximeter.      Long  Term Goals Exhibits proper breathing techniques, such as pursed lip breathing or other method taught during program session Exhibits proper breathing techniques, such as pursed lip breathing or other method taught during program session;Maintenance of O2  saturations>88%      Comments Reviewed PLB technique with pt.  Talked about how it works and it's importance in maintaining their exercise saturations. Danvhas a pulse oximeter to check his oxygen saturation at home. Informed and explained why it is important to have one. Reviewed that oxygen saturations should be 88 percent and above. He states that he does not check it often and can feel when his oxygen is low.      Goals/Expected Outcomes Short: Become more profiecient at using PLB.   Long: Become independent at using PLB. Short: monitor oxygen at home with exertion. Long: maintain oxygen saturations above 88 percent independently.               Oxygen Discharge (Final Oxygen Re-Evaluation):  Oxygen Re-Evaluation - 04/07/22 0933       Program Oxygen Prescription   Program Oxygen Prescription None    Liters per minute 3      Home Oxygen   Home Oxygen Device Portable Concentrator;E-Tanks;Home Concentrator    Sleep Oxygen Prescription Continuous    Liters per minute 3    Home Exercise Oxygen Prescription Continuous    Liters per minute 3    Home Resting Oxygen Prescription None    Liters per minute 0    Compliance with Home Oxygen Use Yes      Goals/Expected Outcomes   Short Term Goals To learn and understand importance of maintaining oxygen saturations>88%;To learn and understand importance of monitoring SPO2 with pulse oximeter and demonstrate accurate use of the pulse oximeter.    Long  Term Goals Exhibits proper breathing techniques, such as pursed lip breathing or other method taught during program session;Maintenance of O2 saturations>88%    Comments Danvhas a pulse oximeter to check his oxygen saturation at home. Informed and explained why it is important to have one. Reviewed that oxygen saturations should be 88 percent and above. He states that he does not check it often and can feel when his oxygen is low.    Goals/Expected Outcomes Short: monitor oxygen at home with  exertion. Long: maintain oxygen saturations above 88 percent independently.  Initial Exercise Prescription:  Initial Exercise Prescription - 03/02/22 1300       Date of Initial Exercise RX and Referring Provider   Date 03/02/22    Referring Provider Wallene Huh MD      Oxygen   Oxygen Continuous    Liters 2   Sending request to doctor to increase O2 Rx   Maintain Oxygen Saturation 88% or higher      Treadmill   MPH 1.8    Grade 0    Minutes 15    METs 2.38      Recumbant Bike   Level 1    RPM 60    Watts 10    Minutes 15    METs 2.2      REL-XR   Level 1    Speed 50    Minutes 15    METs 2.2      T5 Nustep   Level 1    SPM 80    Minutes 15    METs 2.2      Prescription Details   Frequency (times per week) 3    Duration Progress to 30 minutes of continuous aerobic without signs/symptoms of physical distress      Intensity   THRR 40-80% of Max Heartrate 101 - 127    Ratings of Perceived Exertion 11-13    Perceived Dyspnea 0-4      Progression   Progression Continue to progress workloads to maintain intensity without signs/symptoms of physical distress.      Resistance Training   Training Prescription Yes    Weight 3 lb    Reps 10-15             Perform Capillary Blood Glucose checks as needed.  Exercise Prescription Changes:   Exercise Prescription Changes     Row Name 03/02/22 1300 03/17/22 0700 03/29/22 1500 04/12/22 0800 04/16/22 0900     Response to Exercise   Blood Pressure (Admit) 132/72 124/64 144/74 132/82 --   Blood Pressure (Exercise) 150/72 136/74 138/68 142/78 --   Blood Pressure (Exit) 128/70 102/60 112/66 122/60 --   Heart Rate (Admit) 75 bpm 86 bpm 83 bpm 92 bpm --   Heart Rate (Exercise) 96 bpm 90 bpm 101 bpm 102 bpm --   Heart Rate (Exit) 70 bpm 85 bpm 90 bpm 83 bpm --   Oxygen Saturation (Admit) 92 % 90 % 97 % 90 % --   Oxygen Saturation (Exercise) 83 % 88 % 88 % 88 % --   Oxygen Saturation (Exit) 94  % 91 % 92 % 93 % --   Rating of Perceived Exertion (Exercise) 13 11 12 13  --   Perceived Dyspnea (Exercise) 3 -- 4 3 --   Symptoms SOB -- SOB SOB --   Comments walk test results first day of exercise -- -- --   Duration -- Progress to 30 minutes of  aerobic without signs/symptoms of physical distress Continue with 30 min of aerobic exercise without signs/symptoms of physical distress. Continue with 30 min of aerobic exercise without signs/symptoms of physical distress. --   Intensity -- THRR unchanged THRR unchanged THRR unchanged --     Progression   Progression -- Continue to progress workloads to maintain intensity without signs/symptoms of physical distress. Continue to progress workloads to maintain intensity without signs/symptoms of physical distress. Continue to progress workloads to maintain intensity without signs/symptoms of physical distress. --   Average METs -- 2.23 2.56 2.44 --  Resistance Training   Training Prescription -- Yes Yes Yes --   Weight -- 3 lb 3 lb 3 lb --   Reps -- 10-15 10-15 10-15 --     Oxygen   Oxygen -- Continuous Continuous Continuous --   Liters -- $RemoveB'4 4 4 'GSimHfMM$ --     Treadmill   MPH -- 1.$Remove'6 2 2 'fqagFoJ$ --   Grade -- 0 0 0 --   Minutes -- $RemoveBe'15 15 15 'igYAdMvGU$ --   METs -- 2.23 2.53 2.53 --     Recumbant Bike   Level -- -- 2 2 --   Watts -- -- 22 22 --   Minutes -- -- 15 15 --   METs -- -- 2.33 2.33 --     REL-XR   Level -- $Remove'1 2 2 'Aphcwnv$ --   Minutes -- $RemoveBe'15 15 15 'xFKKsxIoF$ --   METs -- -- 2 3 --     T5 Nustep   Level -- -- 1 3 --   Minutes -- -- 15 15 --   METs -- -- 2.34 1.9 --     Home Exercise Plan   Plans to continue exercise at -- -- -- -- Home (comment)  walking, staff videos   Frequency -- -- -- -- Add 2 additional days to program exercise sessions.   Initial Home Exercises Provided -- -- -- -- 04/16/22     Oxygen   Maintain Oxygen Saturation -- 88% or higher 88% or higher 88% or higher --            Exercise Comments:   Exercise Comments     Row Name  03/12/22 0943           Exercise Comments First full day of exercise!  Patient was oriented to gym and equipment including functions, settings, policies, and procedures.  Patient's individual exercise prescription and treatment plan were reviewed.  All starting workloads were established based on the results of the 6 minute walk test done at initial orientation visit.  The plan for exercise progression was also introduced and progression will be customized based on patient's performance and goals.                Exercise Goals and Review:   Exercise Goals     Row Name 03/02/22 1402             Exercise Goals   Increase Physical Activity Yes       Intervention Provide advice, education, support and counseling about physical activity/exercise needs.;Develop an individualized exercise prescription for aerobic and resistive training based on initial evaluation findings, risk stratification, comorbidities and participant's personal goals.       Expected Outcomes Short Term: Attend rehab on a regular basis to increase amount of physical activity.;Long Term: Add in home exercise to make exercise part of routine and to increase amount of physical activity.;Long Term: Exercising regularly at least 3-5 days a week.       Increase Strength and Stamina Yes       Intervention Provide advice, education, support and counseling about physical activity/exercise needs.;Develop an individualized exercise prescription for aerobic and resistive training based on initial evaluation findings, risk stratification, comorbidities and participant's personal goals.       Expected Outcomes Short Term: Increase workloads from initial exercise prescription for resistance, speed, and METs.;Short Term: Perform resistance training exercises routinely during rehab and add in resistance training at home;Long Term: Improve cardiorespiratory fitness, muscular endurance and strength as measured by increased METs  and  functional capacity (6MWT)       Able to understand and use rate of perceived exertion (RPE) scale Yes       Intervention Provide education and explanation on how to use RPE scale       Expected Outcomes Short Term: Able to use RPE daily in rehab to express subjective intensity level;Long Term:  Able to use RPE to guide intensity level when exercising independently       Able to understand and use Dyspnea scale Yes       Intervention Provide education and explanation on how to use Dyspnea scale       Expected Outcomes Short Term: Able to use Dyspnea scale daily in rehab to express subjective sense of shortness of breath during exertion;Long Term: Able to use Dyspnea scale to guide intensity level when exercising independently       Knowledge and understanding of Target Heart Rate Range (THRR) Yes       Intervention Provide education and explanation of THRR including how the numbers were predicted and where they are located for reference       Expected Outcomes Short Term: Able to state/look up THRR;Short Term: Able to use daily as guideline for intensity in rehab;Long Term: Able to use THRR to govern intensity when exercising independently       Able to check pulse independently Yes       Intervention Provide education and demonstration on how to check pulse in carotid and radial arteries.;Review the importance of being able to check your own pulse for safety during independent exercise       Expected Outcomes Short Term: Able to explain why pulse checking is important during independent exercise;Long Term: Able to check pulse independently and accurately       Understanding of Exercise Prescription Yes       Intervention Provide education, explanation, and written materials on patient's individual exercise prescription       Expected Outcomes Short Term: Able to explain program exercise prescription;Long Term: Able to explain home exercise prescription to exercise independently                 Exercise Goals Re-Evaluation :  Exercise Goals Re-Evaluation     Row Name 03/12/22 0943 03/17/22 0730 03/29/22 1517 04/12/22 0905 04/16/22 0937     Exercise Goal Re-Evaluation   Exercise Goals Review Able to understand and use rate of perceived exertion (RPE) scale;Able to understand and use Dyspnea scale;Knowledge and understanding of Target Heart Rate Range (THRR);Understanding of Exercise Prescription Understanding of Exercise Prescription;Increase Physical Activity;Increase Strength and Stamina Understanding of Exercise Prescription;Increase Physical Activity;Increase Strength and Stamina Understanding of Exercise Prescription;Increase Physical Activity;Increase Strength and Stamina Understanding of Exercise Prescription;Increase Physical Activity;Increase Strength and Stamina;Able to understand and use rate of perceived exertion (RPE) scale;Able to understand and use Dyspnea scale;Knowledge and understanding of Target Heart Rate Range (THRR);Able to check pulse independently   Comments Reviewed RPE and dyspnea scales, THR and program prescription with pt today.  Pt voiced understanding and was given a copy of goals to take home. Boruch is doing well to start rehab. He completed his first day of exercise. He did well on the treadmill and XR. His doctor also prescribed him 4 liters of oxygen during exercise. We will continue to monitor Harles's progress in the program. Linna Hoff continues to do well in rehab. He has increased to level 2 on the recumbant bike and XR. He would benefit from adding on a  small incline to the treadmill. Will continue to monitor workloads and oxygen saturations to stay above 88%. Linna Hoff is doing well in rehab. He recently increased to level 3 on the T5 machine. He also improved to 3 METs on the XR machine. Linna Hoff has tolerated using 3 lb hand weights for resistance training as well. We will continue to monitor his progress in the program. Reviewed home exercise with pt today.  Pt plans  to walk and use staff videos at home for exercise.  Reviewed THR, pulse, RPE, sign and symptoms, pulse oximetery and when to call 911 or MD.  Also discussed weather considerations and indoor options.  Pt voiced understanding.   Expected Outcomes Short: Use RPE daily to regulate intensity. Long: Follow program prescription in THR. Short: Continue to come to rehab. Long: Continue to increase strength and stamina. Short: Add incline on treadmill Long: Continue to increase overall MEt level Short: add incline on treadmill. Long: Continue to improve strength and stamina. Short: Add in more walking at home Long: Conitnue to exercise independently            Discharge Exercise Prescription (Final Exercise Prescription Changes):  Exercise Prescription Changes - 04/16/22 0900       Home Exercise Plan   Plans to continue exercise at Home (comment)   walking, staff videos   Frequency Add 2 additional days to program exercise sessions.    Initial Home Exercises Provided 04/16/22             Nutrition:  Target Goals: Understanding of nutrition guidelines, daily intake of sodium '1500mg'$ , cholesterol '200mg'$ , calories 30% from fat and 7% or less from saturated fats, daily to have 5 or more servings of fruits and vegetables.  Education: All About Nutrition: -Group instruction provided by verbal, written material, interactive activities, discussions, models, and posters to present general guidelines for heart healthy nutrition including fat, fiber, MyPlate, the role of sodium in heart healthy nutrition, utilization of the nutrition label, and utilization of this knowledge for meal planning. Follow up email sent as well. Written material given at graduation. Flowsheet Row Pulmonary Rehab from 06/24/2021 in [facility] Cardiac and Pulmonary Rehab  Date 04/22/21  Educator [facility]  Instruction Review Code 1- Verbalizes Understanding       Biometrics:  Pre Biometrics - 03/02/22 1355       Pre Biometrics    Height 6' (1.829 m)    Weight 203 lb 14.4 oz (92.5 kg)    BMI (Calculated) 27.65    Single Leg Stand 1.37 seconds              Nutrition Therapy Plan and Nutrition Goals:  Nutrition Therapy & Goals - 03/02/22 0955       Intervention Plan   Intervention Prescribe, educate and counsel regarding individualized specific dietary modifications aiming towards targeted core components such as weight, hypertension, lipid management, diabetes, heart failure and other comorbidities.    Expected Outcomes Short Term Goal: Understand basic principles of dietary content, such as calories, fat, sodium, cholesterol and nutrients.;Short Term Goal: A plan has been developed with personal nutrition goals set during dietitian appointment.;Long Term Goal: Adherence to prescribed nutrition plan.             Nutrition Assessments:  MEDIFICTS Score Key: ?70 Need to make dietary changes  40-70 Heart Healthy Diet ? 40 Therapeutic Level Cholesterol Diet  Flowsheet Row Pulmonary Rehab from 03/02/2022 in [facility] Cardiac and Pulmonary Rehab  Picture Your Plate Total Score on Admission  60      Picture Your Plate Scores: <64 Unhealthy dietary pattern with much room for improvement. 41-50 Dietary pattern unlikely to meet recommendations for good health and room for improvement. 51-60 More healthful dietary pattern, with some room for improvement.  >60 Healthy dietary pattern, although there may be some specific behaviors that could be improved.   Nutrition Goals Re-Evaluation:  Nutrition Goals Re-Evaluation     [place] Name 04/07/22 0936             Goals   Current Weight 207 lb (93.9 kg)       Nutrition Goal lose some weight       Comment Patient was informed on why it is important to maintain a balanced diet when dealing with Respiratory issues. Explained that it takes a lot of energy to breath and when they are short of breath often they will need to have a good diet to help keep up with the  calories they are expending for breathing.       Expected Outcome Short: Choose and plan snacks accordingly to patients caloric intake to improve breathing. Long: Maintain a diet independently that meets their caloric intake to aid in daily shortness of breath.                Nutrition Goals Discharge (Final Nutrition Goals Re-Evaluation):  Nutrition Goals Re-Evaluation - 04/07/22 0936       Goals   Current Weight 207 lb (93.9 kg)    Nutrition Goal lose some weight    Comment Patient was informed on why it is important to maintain a balanced diet when dealing with Respiratory issues. Explained that it takes a lot of energy to breath and when they are short of breath often they will need to have a good diet to help keep up with the calories they are expending for breathing.    Expected Outcome Short: Choose and plan snacks accordingly to patients caloric intake to improve breathing. Long: Maintain a diet independently that meets their caloric intake to aid in daily shortness of breath.             Psychosocial: Target Goals: Acknowledge presence or absence of significant depression and/or stress, maximize coping skills, provide positive support system. Participant is able to verbalize types and ability to use techniques and skills needed for reducing stress and depression.   Education: Stress, Anxiety, and Depression - Group verbal and visual presentation to define topics covered.  Reviews how body is impacted by stress, anxiety, and depression.  Also discusses healthy ways to reduce stress and to treat/manage anxiety and depression.  Written material given at graduation. Flowsheet Row Pulmonary Rehab from 06/24/2021 in [facility] Cardiac and Pulmonary Rehab  Date 05/20/21  Educator [facility]  Instruction Review Code 1- Verbalizes Understanding       Education: Sleep Hygiene -Provides group verbal and written instruction about how sleep can affect your health.  Define sleep hygiene, discuss  sleep cycles and impact of sleep habits. Review good sleep hygiene tips.    Initial Review & Psychosocial Screening:  Initial Psych Review & Screening - 02/08/22 1017       Initial Review   Current issues with Current Psychotropic Meds      Family Dynamics   Good Support System? Yes    Comments Maclain has a great support system. His wife, daughter that lives down the street and his other sons call him and visit. He has a positive outlook on his health.  He has done the program a few times and is looking to get into shape for his breathing.      Barriers   Psychosocial barriers to participate in program The patient should benefit from training in stress management and relaxation.      Screening Interventions   Interventions To provide support and resources with identified psychosocial needs;Provide feedback about the scores to participant;Encouraged to exercise    Expected Outcomes Short Term goal: Utilizing psychosocial counselor, staff and physician to assist with identification of specific Stressors or current issues interfering with healing process. Setting desired goal for each stressor or current issue identified.;Long Term Goal: Stressors or current issues are controlled or eliminated.;Short Term goal: Identification and review with participant of any Quality of Life or Depression concerns found by scoring the questionnaire.;Long Term goal: The participant improves quality of Life and PHQ9 Scores as seen by post scores and/or verbalization of changes             Quality of Life Scores:  Scores of 19 and below usually indicate a poorer quality of life in these areas.  A difference of  2-3 points is a clinically meaningful difference.  A difference of 2-3 points in the total score of the Quality of Life Index has been associated with significant improvement in overall quality of life, self-image, physical symptoms, and general health in studies assessing change in quality of  life.  PHQ-9: Review Flowsheet       03/02/2022 06/17/2021 04/29/2021 03/30/2021  Depression screen PHQ 2/9  Decreased Interest 0 1 0 0  Down, Depressed, Hopeless 0 0 0 0  PHQ - 2 Score 0 1 0 0  Altered sleeping 0 0 1 2  Tired, decreased energy 1 2 0 3  Change in appetite 1 0 0 1  Feeling bad or failure about yourself  0 0 0 0  Trouble concentrating 0 $RemoveBeforeD'2 3 2  'rgmkhnkjinTjqY$ Moving slowly or fidgety/restless 0 0 0 2  Suicidal thoughts 0 0 0 0  PHQ-9 Score $RemoveBef'2 5 4 10  'bsSAyFFOVM$ Difficult doing work/chores Not difficult at all Not difficult at all Not difficult at all Somewhat difficult   Interpretation of Total Score  Total Score Depression Severity:  1-4 = Minimal depression, 5-9 = Mild depression, 10-14 = Moderate depression, 15-19 = Moderately severe depression, 20-27 = Severe depression   Psychosocial Evaluation and Intervention:  Psychosocial Evaluation - 02/08/22 1019       Psychosocial Evaluation & Interventions   Interventions Encouraged to exercise with the program and follow exercise prescription;Stress management education;Relaxation education    Comments Abdul has a great support system. His wife, daughter that lives down the street and his other sons call him and visit. He has a positive outlook on his health. He has done the program a few times and is looking to get into shape for his breathing.    Expected Outcomes Short: Start [facility] to help with mood. Long: Maintain a healthy mental state    Continue Psychosocial Services  Follow up required by staff             Psychosocial Re-Evaluation:  Psychosocial Re-Evaluation     [place] Name 04/07/22 [phone]             Psychosocial Re-Evaluation   Current issues with None Identified       Comments Patient reports no issues with their current mental states, sleep, stress, depression or anxiety. Will follow up with patient in a few weeks for  any changes.       Expected Outcomes Short: Continue to exercise regularly to support mental health and  notify staff of any changes. Long: maintain mental health and well being through teaching of rehab or prescribed medications independently.       Interventions Encouraged to attend Pulmonary Rehabilitation for the exercise       Continue Psychosocial Services  Follow up required by staff                Psychosocial Discharge (Final Psychosocial Re-Evaluation):  Psychosocial Re-Evaluation - 04/07/22 0937       Psychosocial Re-Evaluation   Current issues with None Identified    Comments Patient reports no issues with their current mental states, sleep, stress, depression or anxiety. Will follow up with patient in a few weeks for any changes.    Expected Outcomes Short: Continue to exercise regularly to support mental health and notify staff of any changes. Long: maintain mental health and well being through teaching of rehab or prescribed medications independently.    Interventions Encouraged to attend Pulmonary Rehabilitation for the exercise    Continue Psychosocial Services  Follow up required by staff             Education: Education Goals: Education classes will be provided on a weekly basis, covering required topics. Participant will state understanding/return demonstration of topics presented.  Learning Barriers/Preferences:  Learning Barriers/Preferences - 02/08/22 1017       Learning Barriers/Preferences   Learning Barriers None    Learning Preferences None             General Pulmonary Education Topics:  Infection Prevention: - Provides verbal and written material to individual with discussion of infection control including proper hand washing and proper equipment cleaning during exercise session. Flowsheet Row Pulmonary Rehab from 04/14/2022 in [facility] Cardiac and Pulmonary Rehab  Date 02/08/22  Educator [facility]  Instruction Review Code 1- Verbalizes Understanding       Falls Prevention: - Provides verbal and written material to individual with discussion of  falls prevention and safety. Flowsheet Row Pulmonary Rehab from 04/14/2022 in [facility] Cardiac and Pulmonary Rehab  Date 02/08/22  Educator [facility]  Instruction Review Code 1- Verbalizes Understanding       Chronic Lung Disease Review: - Group verbal instruction with posters, models, PowerPoint presentations and videos,  to review new updates, new respiratory medications, new advancements in procedures and treatments. Providing information on websites and "800" numbers for continued self-education. Includes information about supplement oxygen, available portable oxygen systems, continuous and intermittent flow rates, oxygen safety, concentrators, and Medicare reimbursement for oxygen. Explanation of Pulmonary Drugs, including class, frequency, complications, importance of spacers, rinsing mouth after steroid MDI's, and proper cleaning methods for nebulizers. Review of basic lung anatomy and physiology related to function, structure, and complications of lung disease. Review of risk factors. Discussion about methods for diagnosing sleep apnea and types of masks and machines for OSA. Includes a review of the use of types of environmental controls: home humidity, furnaces, filters, dust mite/pet prevention, HEPA vacuums. Discussion about weather changes, air quality and the benefits of nasal washing. Instruction on Warning signs, infection symptoms, calling MD promptly, preventive modes, and value of vaccinations. Review of effective airway clearance, coughing and/or vibration techniques. Emphasizing that all should Create an Action Plan. Written material given at graduation. Flowsheet Row Pulmonary Rehab from 04/14/2022 in [facility] Cardiac and Pulmonary Rehab  Education need identified 03/02/22  Date 03/24/22  Educator [facility]  Instruction Review  Code 1- Verbalizes Understanding       AED/CPR: - Group verbal and written instruction with the use of models to demonstrate the basic use of the AED with the basic ABC's of  resuscitation.    Anatomy and Cardiac Procedures: - Group verbal and visual presentation and models provide information about basic cardiac anatomy and function. Reviews the testing methods done to diagnose heart disease and the outcomes of the test results. Describes the treatment choices: Medical Management, Angioplasty, or Coronary Bypass Surgery for treating various heart conditions including Myocardial Infarction, Angina, Valve Disease, and Cardiac Arrhythmias.  Written material given at graduation. Flowsheet Row Pulmonary Rehab from 06/24/2021 in [facility] Cardiac and Pulmonary Rehab  Date 04/08/21  Educator SB  Instruction Review Code 1- Verbalizes Understanding       Medication Safety: - Group verbal and visual instruction to review commonly prescribed medications for heart and lung disease. Reviews the medication, class of the drug, and side effects. Includes the steps to properly store meds and maintain the prescription regimen.  Written material given at graduation. Flowsheet Row Pulmonary Rehab from 06/24/2021 in [facility] Cardiac and Pulmonary Rehab  Date 06/24/21  Educator SB  Instruction Review Code 1- Verbalizes Understanding       Other: -Provides group and verbal instruction on various topics (see comments)   Knowledge Questionnaire Score:  Knowledge Questionnaire Score - 03/02/22 1404       Knowledge Questionnaire Score   Pre Score 15/18              Core Components/Risk Factors/Patient Goals at Admission:  Personal Goals and Risk Factors at Admission - 03/02/22 1402       Core Components/Risk Factors/Patient Goals on Admission    Weight Management Yes;Weight Loss    Intervention Weight Management: Develop a combined nutrition and exercise program designed to reach desired caloric intake, while maintaining appropriate intake of nutrient and fiber, sodium and fats, and appropriate energy expenditure required for the weight goal.;Weight Management: Provide  education and appropriate resources to help participant work on and attain dietary goals.;Weight Management/Obesity: Establish reasonable short term and long term weight goals.    Admit Weight 203 lb (92.1 kg)    Goal Weight: Short Term 198 lb (89.8 kg)    Goal Weight: Long Term 193 lb (87.5 kg)    Expected Outcomes Short Term: Continue to assess and modify interventions until short term weight is achieved;Long Term: Adherence to nutrition and physical activity/exercise program aimed toward attainment of established weight goal;Weight Loss: Understanding of general recommendations for a balanced deficit meal plan, which promotes 1-2 lb weight loss per week and includes a negative energy balance of [phone] kcal/d;Understanding recommendations for meals to include 15-35% energy as protein, 25-35% energy from fat, 35-60% energy from carbohydrates, less than $RemoveB'200mg'KyLznXKm$  of dietary cholesterol, 20-35 gm of total fiber daily;Understanding of distribution of calorie intake throughout the day with the consumption of 4-5 meals/snacks    Improve shortness of breath with ADL's Yes    Intervention Provide education, individualized exercise plan and daily activity instruction to help decrease symptoms of SOB with activities of daily living.    Expected Outcomes Short Term: Improve cardiorespiratory fitness to achieve a reduction of symptoms when performing ADLs;Long Term: Be able to perform more ADLs without symptoms or delay the onset of symptoms    Diabetes Yes    Intervention Provide education about signs/symptoms and action to take for hypo/hyperglycemia.;Provide education about proper nutrition, including hydration, and aerobic/resistive exercise prescription along  with prescribed medications to achieve blood glucose in normal ranges: Fasting glucose 65-99 mg/dL    Expected Outcomes Short Term: Participant verbalizes understanding of the signs/symptoms and immediate care of hyper/hypoglycemia, proper foot care and  importance of medication, aerobic/resistive exercise and nutrition plan for blood glucose control.;Long Term: Attainment of HbA1C < 7%.    Heart Failure Yes    Intervention Provide a combined exercise and nutrition program that is supplemented with education, support and counseling about heart failure. Directed toward relieving symptoms such as shortness of breath, decreased exercise tolerance, and extremity edema.    Expected Outcomes Improve functional capacity of life;Short term: Attendance in program 2-3 days a week with increased exercise capacity. Reported lower sodium intake. Reported increased fruit and vegetable intake. Reports medication compliance.;Short term: Daily weights obtained and reported for increase. Utilizing diuretic protocols set by physician.;Long term: Adoption of self-care skills and reduction of barriers for early signs and symptoms recognition and intervention leading to self-care maintenance.    Hypertension Yes    Intervention Provide education on lifestyle modifcations including regular physical activity/exercise, weight management, moderate sodium restriction and increased consumption of fresh fruit, vegetables, and low fat dairy, alcohol moderation, and smoking cessation.;Monitor prescription use compliance.    Expected Outcomes Short Term: Continued assessment and intervention until BP is < 140/6mm HG in hypertensive participants. < 130/56mm HG in hypertensive participants with diabetes, heart failure or chronic kidney disease.;Long Term: Maintenance of blood pressure at goal levels.    Lipids Yes    Intervention Provide education and support for participant on nutrition & aerobic/resistive exercise along with prescribed medications to achieve LDL '70mg'$ , HDL >$Remo'40mg'sSRyP$ .    Expected Outcomes Short Term: Participant states understanding of desired cholesterol values and is compliant with medications prescribed. Participant is following exercise prescription and nutrition  guidelines.;Long Term: Cholesterol controlled with medications as prescribed, with individualized exercise RX and with personalized nutrition plan. Value goals: LDL < $Rem'70mg'DWBx$ , HDL > 40 mg.             Education:Diabetes - Individual verbal and written instruction to review signs/symptoms of diabetes, desired ranges of glucose level fasting, after meals and with exercise. Acknowledge that pre and post exercise glucose checks will be done for 3 sessions at entry of program. Flowsheet Row Pulmonary Rehab from 04/14/2022 in [facility] Cardiac and Pulmonary Rehab  Date 02/08/22  Educator [facility]  Instruction Review Code 1- Verbalizes Understanding       Know Your Numbers and Heart Failure: - Group verbal and visual instruction to discuss disease risk factors for cardiac and pulmonary disease and treatment options.  Reviews associated critical values for Overweight/Obesity, Hypertension, Cholesterol, and Diabetes.  Discusses basics of heart failure: signs/symptoms and treatments.  Introduces Heart Failure Zone chart for action plan for heart failure.  Written material given at graduation. Flowsheet Row Pulmonary Rehab from 04/14/2022 in [facility] Cardiac and Pulmonary Rehab  Date 03/17/22  Educator SB  Instruction Review Code 1- Verbalizes Understanding       Core Components/Risk Factors/Patient Goals Review:   Goals and Risk Factor Review     Row Name 04/07/22 [phone]             Core Components/Risk Factors/Patient Goals Review   Personal Goals Review Improve shortness of breath with ADL's       Review Spoke to patient about their shortness of breath and what they can do to improve. Patient has been informed of breathing techniques when starting the program. Patient is informed to tell  staff if they have had any med changes and that certain meds they are taking or not taking can be causing shortness of breath.       Expected Outcomes Short: Attend [facility] regularly to improve shortness of breath with  ADL's. Long: maintain independence with ADL's                Core Components/Risk Factors/Patient Goals at Discharge (Final Review):   Goals and Risk Factor Review - 04/07/22 0938       Core Components/Risk Factors/Patient Goals Review   Personal Goals Review Improve shortness of breath with ADL's    Review Spoke to patient about their shortness of breath and what they can do to improve. Patient has been informed of breathing techniques when starting the program. Patient is informed to tell staff if they have had any med changes and that certain meds they are taking or not taking can be causing shortness of breath.    Expected Outcomes Short: Attend [facility] regularly to improve shortness of breath with ADL's. Long: maintain independence with ADL's             ITP Comments:  ITP Comments     Row Name 02/08/22 1026 03/02/22 0947 03/12/22 0943 03/24/22 1236 04/21/22 0812   ITP Comments In patient  Visit completed. Patient informed on EP and RD appointment and 6 Minute walk test. Patient also informed of patient health questionnaires on My Chart. Patient Verbalizes understanding. Visit diagnosis can be found in [facility] 12/08/2021. Completed 6MWT and gym orientation. Initial ITP created and sent for review to Dr. Ottie Glazier,  Medical Director. First full day of exercise!  Patient was oriented to gym and equipment including functions, settings, policies, and procedures.  Patient's individual exercise prescription and treatment plan were reviewed.  All starting workloads were established based on the results of the 6 minute walk test done at initial orientation visit.  The plan for exercise progression was also introduced and progression will be customized based on patient's performance and goals. 30 Day review completed. Medical Director ITP review done, changes made as directed, and signed approval by Medical Director. 30 Day review completed. Medical Director ITP review done, changes made as  directed, and signed approval by Medical Director.            Comments:

## 2022-04-21 NOTE — Progress Notes (Signed)
Daily Session Note  Patient Details  Name: Jose Ray MRN: [id number] Date of Birth: 03-12-1941 Referring Provider:   Flowsheet Row Pulmonary Rehab from 03/02/2022 in [facility] Cardiac and Pulmonary Rehab  Referring Provider Wallene Huh MD       Encounter Date: 04/21/2022  Check In:  Session Check In - 04/21/22 0933       Check-In   Supervising physician immediately available to respond to emergencies See telemetry face sheet for immediately available ER MD    Location [facility]-Cardiac & Pulmonary Rehab    Staff Present Justin Mend, RCP,RRT,BSRT;Ceil Roderick, MPA, RN;Melissa [place], RDN, LDN    Virtual Visit No    Medication changes reported     No    Fall or balance concerns reported    No    Warm-up and Cool-down Performed on first and last piece of equipment    Resistance Training Performed Yes    VAD Patient? No    PAD/SET Patient? No      Pain Assessment   Currently in Pain? No/denies                Social History   Tobacco Use  Smoking Status Former   Packs/day: 1.00   Years: 35.00   Total pack years: 35.00   Types: Cigarettes   Quit date: 1995   Years since quitting: 28.5  Smokeless Tobacco Never    Goals Met:  Independence with exercise equipment Exercise tolerated well No report of concerns or symptoms today Strength training completed today  Goals Unmet:  Not Applicable  Comments: Pt able to follow exercise prescription today without complaint.  Will continue to monitor for progression.    Dr. Emily Filbert is Medical Director for [place].  Dr. Ottie Glazier is Medical Director for [facility] Pulmonary Rehabilitation.

## 2022-04-23 ENCOUNTER — Encounter: Payer: Medicare Other | Admitting: *Deleted

## 2022-04-23 DIAGNOSIS — J439 Emphysema, unspecified: Secondary | ICD-10-CM

## 2022-04-23 NOTE — Progress Notes (Signed)
Daily Session Note  Patient Details  Name: Jose Ray MRN: [id number] Date of Birth: 01/05/1941 Referring Provider:   Flowsheet Row Pulmonary Rehab from 03/02/2022 in [facility] Cardiac and Pulmonary Rehab  Referring Provider Fleming, Herbon MD       Encounter Date: 04/23/2022  Check In:  Session Check In - 04/23/22 1006       Check-In   Supervising physician immediately available to respond to emergencies See telemetry face sheet for immediately available ER MD    Location [facility]-Cardiac & Pulmonary Rehab    Staff Present Jessica Hawkins, MA, RCEP, CCRP, CCET;Susanne Bice, RN, BSN, CCRP;Other   Olinty Richards MS,ACSM CEP,Exercise Physiologist   Virtual Visit No    Medication changes reported     No    Fall or balance concerns reported    No    Warm-up and Cool-down Performed on first and last piece of equipment    Resistance Training Performed Yes    VAD Patient? No    PAD/SET Patient? No      Pain Assessment   Currently in Pain? No/denies                Social History   Tobacco Use  Smoking Status Former   Packs/day: 1.00   Years: 35.00   Total pack years: 35.00   Types: Cigarettes   Quit date: 1995   Years since quitting: 28.5  Smokeless Tobacco Never    Goals Met:  Proper associated with RPD/PD & O2 Sat Independence with exercise equipment Exercise tolerated well No report of concerns or symptoms today  Goals Unmet:  Not Applicable  Comments: Pt able to follow exercise prescription today without complaint.  Will continue to monitor for progression.    Dr. Mark Miller is Medical Director for [facility].  Dr. Fuad Aleskerov is Medical Director for [facility] Pulmonary Rehabilitation. 

## 2022-04-26 ENCOUNTER — Encounter: Payer: Medicare Other | Admitting: *Deleted

## 2022-04-26 DIAGNOSIS — J439 Emphysema, unspecified: Secondary | ICD-10-CM

## 2022-04-26 NOTE — Progress Notes (Signed)
Daily Session Note  Patient Details  Name: Jose Ray MRN: [id number] Date of Birth: 05/13/1941 Referring Provider:   Flowsheet Row Pulmonary Rehab from 03/02/2022 in [facility] Cardiac and Pulmonary Rehab  Referring Provider Wallene Huh MD       Encounter Date: 04/26/2022  Check In:  Session Check In - 04/26/22 0941       Check-In   Supervising physician immediately available to respond to emergencies See telemetry face sheet for immediately available ER MD    Location [facility]-Cardiac & Pulmonary Rehab    Staff Present Heath Lark, RN, BSN, CCRP;Kristen Coble, RN,BC,MSN;Kelly Staples, BS, ACSM CEP, Exercise Physiologist    Virtual Visit No    Medication changes reported     No    Fall or balance concerns reported    No    Warm-up and Cool-down Performed on first and last piece of equipment    Resistance Training Performed Yes    VAD Patient? No    PAD/SET Patient? No      Pain Assessment   Currently in Pain? No/denies                Social History   Tobacco Use  Smoking Status Former   Packs/day: 1.00   Years: 35.00   Total pack years: 35.00   Types: Cigarettes   Quit date: 1995   Years since quitting: 28.5  Smokeless Tobacco Never    Goals Met:  Proper associated with RPD/PD & O2 Sat Independence with exercise equipment Exercise tolerated well No report of concerns or symptoms today  Goals Unmet:  Not Applicable  Comments: Pt able to follow exercise prescription today without complaint.  Will continue to monitor for progression.    Dr. Emily Filbert is Medical Director for [place].  Dr. Ottie Glazier is Medical Director for [facility] Pulmonary Rehabilitation.

## 2022-04-26 NOTE — Progress Notes (Signed)
Completed initial RD consultation ?

## 2022-05-03 ENCOUNTER — Encounter: Payer: Medicare Other | Admitting: *Deleted

## 2022-05-03 DIAGNOSIS — J439 Emphysema, unspecified: Secondary | ICD-10-CM | POA: Diagnosis not present

## 2022-05-03 NOTE — Progress Notes (Signed)
Daily Session Note  Patient Details  Name: Jose Ray MRN: [id number] Date of Birth: 10-19-1940 Referring Provider:   Flowsheet Row Pulmonary Rehab from 03/02/2022 in [facility] Cardiac and Pulmonary Rehab  Referring Provider Wallene Huh MD       Encounter Date: 05/03/2022  Check In:  Session Check In - 05/03/22 0937       Check-In   Supervising physician immediately available to respond to emergencies See telemetry face sheet for immediately available ER MD    Location [facility]-Cardiac & Pulmonary Rehab    Staff Present Heath Lark, RN, BSN, CCRP;Jessica [place], MA, RCEP, CCRP, Deputy, BS, ACSM CEP, Exercise Physiologist    Virtual Visit No    Medication changes reported     No    Fall or balance concerns reported    No    Warm-up and Cool-down Performed on first and last piece of equipment    Resistance Training Performed Yes    VAD Patient? No    PAD/SET Patient? No      Pain Assessment   Currently in Pain? No/denies                Social History   Tobacco Use  Smoking Status Former   Packs/day: 1.00   Years: 35.00   Total pack years: 35.00   Types: Cigarettes   Quit date: 1995   Years since quitting: 28.5  Smokeless Tobacco Never    Goals Met:  Proper associated with RPD/PD & O2 Sat Independence with exercise equipment Exercise tolerated well No report of concerns or symptoms today  Goals Unmet:  Not Applicable  Comments: Pt able to follow exercise prescription today without complaint.  Will continue to monitor for progression. Requiring 4 l/min via nasal cannula to keep oxygen sats 86-88%   Dr. Emily Filbert is Medical Director for [place].  Dr. Ottie Glazier is Medical Director for [facility] Pulmonary Rehabilitation.

## 2022-05-12 ENCOUNTER — Encounter: Payer: Medicare Other | Attending: Specialist

## 2022-05-12 DIAGNOSIS — J439 Emphysema, unspecified: Secondary | ICD-10-CM | POA: Diagnosis present

## 2022-05-12 DIAGNOSIS — I5022 Chronic systolic (congestive) heart failure: Secondary | ICD-10-CM | POA: Insufficient documentation

## 2022-05-12 NOTE — Progress Notes (Signed)
Daily Session Note  Patient Details  Name: Jose Ray MRN: [id number] Date of Birth: 07/07/1941 Referring Provider:   Flowsheet Row Pulmonary Rehab from 03/02/2022 in [facility] Cardiac and Pulmonary Rehab  Referring Provider Wallene Huh MD       Encounter Date: 05/12/2022  Check In:  Session Check In - 05/12/22 0920       Check-In   Supervising physician immediately available to respond to emergencies See telemetry face sheet for immediately available ER MD    Location [facility]-Cardiac & Pulmonary Rehab    Staff Present Carson Myrtle, BS, RRT, CPFT;Tasean Mancha, BS, Exercise Physiologist;Susanne Bice, RN, BSN, CCRP;Melissa [place], RDN, Tawanna Solo, MS, ASCM CEP, Exercise Physiologist    Virtual Visit No    Medication changes reported     No    Fall or balance concerns reported    No    Warm-up and Cool-down Performed on first and last piece of equipment    Resistance Training Performed Yes    VAD Patient? No    PAD/SET Patient? No      Pain Assessment   Currently in Pain? No/denies    Multiple Pain Sites No                Social History   Tobacco Use  Smoking Status Former   Packs/day: 1.00   Years: 35.00   Total pack years: 35.00   Types: Cigarettes   Quit date: 1995   Years since quitting: 28.6  Smokeless Tobacco Never    Goals Met:  Independence with exercise equipment Exercise tolerated well No report of concerns or symptoms today  Goals Unmet:  Not Applicable  Comments: Pt able to follow exercise prescription today without complaint.  Will continue to monitor for progression.    Dr. Emily Filbert is Medical Director for [place].  Dr. Ottie Glazier is Medical Director for [facility] Pulmonary Rehabilitation.

## 2022-05-14 ENCOUNTER — Encounter: Payer: Medicare Other | Admitting: *Deleted

## 2022-05-14 DIAGNOSIS — J439 Emphysema, unspecified: Secondary | ICD-10-CM | POA: Diagnosis not present

## 2022-05-14 NOTE — Progress Notes (Signed)
Daily Session Note  Patient Details  Name: Jose Ray MRN: [id number] Date of Birth: 1941-10-02 Referring Provider:   Flowsheet Row Pulmonary Rehab from 03/02/2022 in [facility] Cardiac and Pulmonary Rehab  Referring Provider Wallene Huh MD       Encounter Date: 05/14/2022  Check In:  Session Check In - 05/14/22 0924       Check-In   Supervising physician immediately available to respond to emergencies See telemetry face sheet for immediately available ER MD    Location [facility]-Cardiac & Pulmonary Rehab    Staff Present Heath Lark, RN, BSN, CCRP;Roylee Chaffin Sherryll Burger, RN BSN;Kristen Coble, RN,BC,MSN    Virtual Visit No    Medication changes reported     No    Fall or balance concerns reported    No    Warm-up and Cool-down Performed on first and last piece of equipment    Resistance Training Performed Yes    VAD Patient? No    PAD/SET Patient? No      Pain Assessment   Currently in Pain? No/denies                Social History   Tobacco Use  Smoking Status Former   Packs/day: 1.00   Years: 35.00   Total pack years: 35.00   Types: Cigarettes   Quit date: 1995   Years since quitting: 28.6  Smokeless Tobacco Never    Goals Met:  Independence with exercise equipment Exercise tolerated well No report of concerns or symptoms today Strength training completed today  Goals Unmet:  Not Applicable  Comments: Pt able to follow exercise prescription today without complaint.  Will continue to monitor for progression.    Dr. Emily Filbert is Medical Director for [place].  Dr. Ottie Glazier is Medical Director for [facility] Pulmonary Rehabilitation.

## 2022-05-17 ENCOUNTER — Encounter: Payer: Medicare Other | Admitting: *Deleted

## 2022-05-17 DIAGNOSIS — J439 Emphysema, unspecified: Secondary | ICD-10-CM

## 2022-05-17 NOTE — Progress Notes (Signed)
Daily Session Note  Patient Details  Name: Jose Ray MRN: [id number] Date of Birth: 03-25-1941 Referring Provider:   Flowsheet Row Pulmonary Rehab from 03/02/2022 in [facility] Cardiac and Pulmonary Rehab  Referring Provider Wallene Huh MD       Encounter Date: 05/17/2022  Check In:  Session Check In - 05/17/22 1010       Check-In   Supervising physician immediately available to respond to emergencies See telemetry face sheet for immediately available ER MD    Location [facility]-Cardiac & Pulmonary Rehab    Staff Present Heath Lark, RN, BSN, Laveda Norman, BS, ACSM CEP, Exercise Physiologist;Kara Eliezer Bottom, MS, ASCM CEP, Exercise Physiologist    Virtual Visit No    Medication changes reported     No    Fall or balance concerns reported    No    Warm-up and Cool-down Performed on first and last piece of equipment    Resistance Training Performed Yes    VAD Patient? No    PAD/SET Patient? No      Pain Assessment   Currently in Pain? No/denies                Social History   Tobacco Use  Smoking Status Former   Packs/day: 1.00   Years: 35.00   Total pack years: 35.00   Types: Cigarettes   Quit date: 1995   Years since quitting: 28.6  Smokeless Tobacco Never    Goals Met:  Proper associated with RPD/PD & O2 Sat Independence with exercise equipment Exercise tolerated well No report of concerns or symptoms today  Goals Unmet:  Not Applicable  Comments: Pt able to follow exercise prescription today without complaint.  Will continue to monitor for progression.    Dr. Emily Filbert is Medical Director for [place].  Dr. Ottie Glazier is Medical Director for [facility] Pulmonary Rehabilitation.

## 2022-05-19 ENCOUNTER — Encounter: Payer: Medicare Other | Admitting: *Deleted

## 2022-05-19 ENCOUNTER — Encounter: Payer: Self-pay | Admitting: *Deleted

## 2022-05-19 DIAGNOSIS — J439 Emphysema, unspecified: Secondary | ICD-10-CM | POA: Diagnosis not present

## 2022-05-19 DIAGNOSIS — I5022 Chronic systolic (congestive) heart failure: Secondary | ICD-10-CM

## 2022-05-19 NOTE — Progress Notes (Signed)
Daily Session Note  Patient Details  Name: Jose Ray MRN: [id number] Date of Birth: 01-29-1941 Referring Provider:   Flowsheet Row Pulmonary Rehab from 03/02/2022 in [facility] Cardiac and Pulmonary Rehab  Referring Provider Wallene Huh MD       Encounter Date: 05/19/2022  Check In:  Session Check In - 05/19/22 1637       Check-In   Supervising physician immediately available to respond to emergencies See telemetry face sheet for immediately available ER MD    Location [facility]-Cardiac & Pulmonary Rehab    Staff Present Nyoka Cowden, RN, BSN, Fenton Foy, BS, Exercise Physiologist;Susanne Bice, RN, BSN, CCRP;Melissa [place], RDN, LDN;Jessica [place], MA, RCEP, CCRP, [place], MS, ASCM CEP, Exercise Physiologist    Virtual Visit No    Medication changes reported     No    Fall or balance concerns reported    No    Tobacco Cessation No Change    Warm-up and Cool-down Performed on first and last piece of equipment    Resistance Training Performed No    VAD Patient? No    PAD/SET Patient? No      Pain Assessment   Currently in Pain? No/denies                Social History   Tobacco Use  Smoking Status Former   Packs/day: 1.00   Years: 35.00   Total pack years: 35.00   Types: Cigarettes   Quit date: 1995   Years since quitting: 28.6  Smokeless Tobacco Never    Goals Met:  Independence with exercise equipment Exercise tolerated well No report of concerns or symptoms today  Goals Unmet:  Not Applicable  Comments: Pt able to follow exercise prescription today without complaint.  Will continue to monitor for progression.    Dr. Emily Filbert is Medical Director for [place].  Dr. Ottie Glazier is Medical Director for [facility] Pulmonary Rehabilitation.

## 2022-05-19 NOTE — Progress Notes (Signed)
Pulmonary Individual Treatment Plan  Patient Details  Name: Jose Ray MRN: [id number] Date of Birth: [birth year] Referring Provider:   Flowsheet Row Pulmonary Rehab from 03/02/2022 in [facility] Cardiac and Pulmonary Rehab  Referring Provider Wallene Huh MD       Initial Encounter Date:  Flowsheet Row Pulmonary Rehab from 03/02/2022 in [facility] Cardiac and Pulmonary Rehab  Date 03/02/22       Visit Diagnosis: Pulmonary emphysema, unspecified emphysema type ([place])  Patient's Home Medications on Admission:  Current Outpatient Medications:    aspirin 81 MG tablet, Take 81 mg by mouth at bedtime., Disp: , Rfl:    atorvastatin (LIPITOR) 80 MG tablet, Take 80 mg by mouth at bedtime. (Patient not taking: Reported on 03/23/2021), Disp: , Rfl:    atorvastatin (LIPITOR) 80 MG tablet, Take 1 tablet by mouth at bedtime. (Patient not taking: Reported on 02/08/2022), Disp: , Rfl:    Cholecalciferol 25 MCG (1000 UT) capsule, Take by mouth., Disp: , Rfl:    cyanocobalamin 1000 MCG tablet, Take by mouth., Disp: , Rfl:    escitalopram (LEXAPRO) 5 MG tablet, Take 5 mg by mouth every morning., Disp: , Rfl:    famotidine (PEPCID) 10 MG tablet, Take 10 mg by mouth daily., Disp: , Rfl:    fexofenadine (ALLEGRA) 180 MG tablet, Take 180 mg by mouth at bedtime., Disp: , Rfl:    fluticasone (FLONASE) 50 MCG/ACT nasal spray, Place 2 sprays into both nostrils at bedtime. (Patient not taking: Reported on 02/08/2022), Disp: , Rfl:    fluticasone (FLONASE) 50 MCG/ACT nasal spray, Place 2 sprays into both nostrils daily., Disp: , Rfl:    fluticasone-salmeterol (ADVAIR) 250-50 MCG/ACT AEPB, Advair Diskus 250 mcg-50 mcg/dose powder for inhalation (Patient not taking: Reported on 02/08/2022), Disp: , Rfl:    Fluticasone-Salmeterol (ADVAIR) 250-50 MCG/DOSE AEPB, Inhale 1 puff into the lungs 2 (two) times daily., Disp: , Rfl:    glipiZIDE (GLUCOTROL) 10 MG tablet, Take 10 mg by mouth daily before breakfast., Disp: , Rfl:     hydrochlorothiazide (HYDRODIURIL) 25 MG tablet, Take 25 mg by mouth daily., Disp: , Rfl:    hydrochlorothiazide (HYDRODIURIL) 25 MG tablet, Take 1 tablet by mouth daily. (Patient not taking: Reported on 03/23/2021), Disp: , Rfl:    icosapent Ethyl (VASCEPA) 1 g capsule, Take 2 g by mouth 2 (two) times daily., Disp: , Rfl:    isosorbide mononitrate (IMDUR) 30 MG 24 hr tablet, Take 30 mg by mouth daily. (Patient not taking: Reported on 02/08/2022), Disp: , Rfl:    isosorbide mononitrate (IMDUR) 30 MG 24 hr tablet, Take 1 tablet by mouth daily., Disp: , Rfl:    losartan (COZAAR) 100 MG tablet, Take 1 tablet by mouth daily., Disp: , Rfl:    losartan (COZAAR) 50 MG tablet, Take 100 mg by mouth daily. (Patient not taking: Reported on 02/08/2022), Disp: , Rfl:    metFORMIN (GLUCOPHAGE) 1000 MG tablet, Take 1,000 mg by mouth 2 (two) times daily., Disp: , Rfl:    metoprolol succinate (TOPROL-XL) 100 MG 24 hr tablet, Take 50 mg by mouth daily. Take with or immediately following a meal. (Patient not taking: Reported on 03/23/2021), Disp: , Rfl:    metoprolol succinate (TOPROL-XL) 25 MG 24 hr tablet, Take 1 tablet by mouth daily., Disp: , Rfl:    Multiple Vitamins-Minerals (PRESERVISION AREDS 2) CAPS, Take 1 capsule by mouth at bedtime., Disp: , Rfl:    OXYGEN, Inhale 2.5-3 L into the lungs See admin instructions. Used while  sleeping and physical activity, Disp: , Rfl:    Polyethyl Glycol-Propyl Glycol 0.4-0.3 % SOLN, Place 1 drop into both eyes See admin instructions. Instill 1 drop into both eyes at night, may use during the day as needed for dry eyes, Disp: , Rfl:    rosuvastatin (CRESTOR) 10 MG tablet, Take 10 mg by mouth every other day., Disp: , Rfl:    tiotropium (SPIRIVA) 18 MCG inhalation capsule, Place 18 mcg into inhaler and inhale daily., Disp: , Rfl:    TRELEGY ELLIPTA 100-62.5-25 MCG/ACT AEPB, SMARTSIG:1 inhalation Via Inhaler Daily, Disp: , Rfl:   Past Medical History: Past Medical History:   Diagnosis Date   Acute MI ([place])    CHF (congestive heart failure) (HCC)    COPD (chronic obstructive pulmonary disease) ([place])    Coronary artery disease    Diabetes mellitus without complication (HCC)    GERD (gastroesophageal reflux disease)    History of hiatal hernia    Hypertension     Tobacco Use: Social History   Tobacco Use  Smoking Status Former   Packs/day: 1.00   Years: 35.00   Total pack years: 35.00   Types: Cigarettes   Quit date: 1995   Years since quitting: 28.6  Smokeless Tobacco Never    Labs: Review Flowsheet       Latest Ref Rng & Units 10/25/2016  Labs for ITP Cardiac and Pulmonary Rehab  Hemoglobin A1c 4.8 - 5.6 % 7.0      Pulmonary Assessment Scores:  Pulmonary Assessment Scores     Row Name 03/02/22 [phone]         ADL UCSD   ADL Phase Entry     SOB Score total 55     Rest 2     Walk 2     Stairs 4     Bath 2     Dress 1     Shop 3       CAT Score   CAT Score 18       mMRC Score   mMRC Score 3              UCSD: Self-administered rating of dyspnea associated with activities of daily living (ADLs) 6-point scale (0 = "not at all" to 5 = "maximal or unable to do because of breathlessness")  Scoring Scores range from 0 to 120.  Minimally important difference is 5 units  CAT: CAT can identify the health impairment of COPD patients and is better correlated with disease progression.  CAT has a scoring range of zero to 40. The CAT score is classified into four groups of low (less than 10), medium (10 - 20), high (21-30) and very high (31-40) based on the impact level of disease on health status. A CAT score over 10 suggests significant symptoms.  A worsening CAT score could be explained by an exacerbation, poor medication adherence, poor inhaler technique, or progression of COPD or comorbid conditions.  CAT MCID is 2 points  mMRC: mMRC (Modified Medical Research Council) Dyspnea Scale is used to assess the degree of baseline  functional disability in patients of respiratory disease due to dyspnea. No minimal important difference is established. A decrease in score of 1 point or greater is considered a positive change.   Pulmonary Function Assessment:  Pulmonary Function Assessment - 02/08/22 1015       Breath   Shortness of Breath Yes;Limiting activity             Exercise Target  Goals: Exercise Program Goal: Individual exercise prescription set using results from initial 6 min walk test and THRR while considering  patient's activity barriers and safety.   Exercise Prescription Goal: Initial exercise prescription builds to 30-45 minutes a day of aerobic activity, 2-3 days per week.  Home exercise guidelines will be given to patient during program as part of exercise prescription that the participant will acknowledge.  Education: Aerobic Exercise: - Group verbal and visual presentation on the components of exercise prescription. Introduces F.I.T.T principle from ACSM for exercise prescriptions.  Reviews F.I.T.T. principles of aerobic exercise including progression. Written material given at graduation. Flowsheet Row Pulmonary Rehab from 06/24/2021 in [facility] Cardiac and Pulmonary Rehab  Date 06/03/21  Educator [facility]  Instruction Review Code 1- Verbalizes Understanding       Education: Resistance Exercise: - Group verbal and visual presentation on the components of exercise prescription. Introduces F.I.T.T principle from ACSM for exercise prescriptions  Reviews F.I.T.T. principles of resistance exercise including progression. Written material given at graduation. Flowsheet Row Pulmonary Rehab from 06/24/2021 in [facility] Cardiac and Pulmonary Rehab  Date 04/08/21  Educator [facility]  Instruction Review Code 1- Verbalizes Understanding        Education: Exercise & Equipment Safety: - Individual verbal instruction and demonstration of equipment use and safety with use of the equipment. Flowsheet Row Pulmonary Rehab  from 05/12/2022 in [facility] Cardiac and Pulmonary Rehab  Date 02/08/22  Educator [facility]  Instruction Review Code 1- Verbalizes Understanding       Education: Exercise Physiology & General Exercise Guidelines: - Group verbal and written instruction with models to review the exercise physiology of the cardiovascular system and associated critical values. Provides general exercise guidelines with specific guidelines to those with heart or lung disease.  Flowsheet Row Pulmonary Rehab from 05/12/2022 in [facility] Cardiac and Pulmonary Rehab  Date 04/07/22  Educator [facility]  Instruction Review Code 1- [employer] Understanding       Education: Flexibility, Balance, Mind/Body Relaxation: - Group verbal and visual presentation with interactive activity on the components of exercise prescription. Introduces F.I.T.T principle from ACSM for exercise prescriptions. Reviews F.I.T.T. principles of flexibility and balance exercise training including progression. Also discusses the mind body connection.  Reviews various relaxation techniques to help reduce and manage stress (i.e. Deep breathing, progressive muscle relaxation, and visualization). Balance handout provided to take home. Written material given at graduation. Flowsheet Row Pulmonary Rehab from 06/24/2021 in [facility] Cardiac and Pulmonary Rehab  Date 06/17/21  Educator [facility]  Instruction Review Code 1- Verbalizes Understanding       Activity Barriers & Risk Stratification:  Activity Barriers & Cardiac Risk Stratification - 03/02/22 1355       Activity Barriers & Cardiac Risk Stratification   Activity Barriers Shortness of Breath;Muscular Weakness             6 Minute Walk:  6 Minute Walk     Row Name 03/02/22 1355         6 Minute Walk   Phase Initial     Distance 1130 feet     Walk Time 6 minutes     # of Rest Breaks 0     MPH 2.14     METS 2.22     RPE 13     Perceived Dyspnea  3     VO2 Peak 7.77     Symptoms Yes (comment)     Comments SOB      Resting HR 75 bpm     Resting  BP 134/72     Resting Oxygen Saturation  92 %     Exercise Oxygen Saturation  during 6 min walk 83 %     Max Ex. HR 96 bpm     Max Ex. BP 150/72     2 Minute Post BP 128/70       Interval HR   1 Minute HR 87     2 Minute HR 93     3 Minute HR 94     4 Minute HR 95     5 Minute HR 96     6 Minute HR 94     2 Minute Post HR 70     Interval Heart Rate? Yes       Interval Oxygen   Interval Oxygen? Yes     Baseline Oxygen Saturation % 92 %  RA: 88%     1 Minute Oxygen Saturation % 89 %     1 Minute Liters of Oxygen 4 L  pulsed     2 Minute Oxygen Saturation % 85 %     2 Minute Liters of Oxygen 4 L     3 Minute Oxygen Saturation % 85 %     3 Minute Liters of Oxygen 4 L     4 Minute Oxygen Saturation % 83 %     4 Minute Liters of Oxygen 4 L     5 Minute Oxygen Saturation % 85 %     5 Minute Liters of Oxygen 4 L     6 Minute Oxygen Saturation % 83 %     6 Minute Liters of Oxygen 4 L     2 Minute Post Oxygen Saturation % 94 %     2 Minute Post Liters of Oxygen 4 L             Oxygen Initial Assessment:  Oxygen Initial Assessment - 03/02/22 0950       Home Oxygen   Home Oxygen Device Portable Concentrator;E-Tanks;Home Concentrator    Sleep Oxygen Prescription Continuous    Liters per minute 3    Home Exercise Oxygen Prescription Continuous    Liters per minute 3    Home Resting Oxygen Prescription Continuous    Liters per minute 0   Uses PRN- encouraged to use all times above 88%   Compliance with Home Oxygen Use Yes      Initial 6 min Walk   Oxygen Used Pulsed    Liters per minute 4      Program Oxygen Prescription   Program Oxygen Prescription Continuous    Liters per minute 2   Reaching out to MD for increase in O2 for prescription     Intervention   Short Term Goals To learn and demonstrate proper pursed lip breathing techniques or other breathing techniques. ;To learn and exhibit compliance with exercise, home and travel O2  prescription;To learn and understand importance of monitoring SPO2 with pulse oximeter and demonstrate accurate use of the pulse oximeter.;To learn and understand importance of maintaining oxygen saturations>88%;To learn and demonstrate proper use of respiratory medications    Long  Term Goals Exhibits compliance with exercise, home  and travel O2 prescription;Verbalizes importance of monitoring SPO2 with pulse oximeter and return demonstration;Maintenance of O2 saturations>88%;Exhibits proper breathing techniques, such as pursed lip breathing or other method taught during program session;Compliance with respiratory medication;Demonstrates proper use of MDI's             Oxygen Re-Evaluation:  Oxygen  Re-Evaluation     Row Name 03/12/22 0945 04/07/22 0933 05/03/22 0957         Program Oxygen Prescription   Program Oxygen Prescription Continuous None Continuous;E-Tanks     Liters per minute $RemoveBe'2 3 4       '[id number]$ Home Oxygen   Home Oxygen Device Portable Concentrator;E-Tanks;Home Concentrator Portable Concentrator;E-Tanks;Home Concentrator Portable Concentrator;E-Tanks;Home Concentrator     Sleep Oxygen Prescription Continuous Continuous Continuous     Liters per minute $RemoveBe'3 3 3     '[id number]$ Home Exercise Oxygen Prescription Continuous Continuous Continuous     Liters per minute $RemoveBe'3 3 3     '[id number]$ Home Resting Oxygen Prescription Continuous None None     Liters per minute 0 0 --     Compliance with Home Oxygen Use Yes Yes Yes       Goals/Expected Outcomes   Short Term Goals To learn and demonstrate proper pursed lip breathing techniques or other breathing techniques.  To learn and understand importance of maintaining oxygen saturations>88%;To learn and understand importance of monitoring SPO2 with pulse oximeter and demonstrate accurate use of the pulse oximeter. To learn and understand importance of maintaining oxygen saturations>88%;To learn and understand importance of monitoring SPO2 with pulse oximeter and  demonstrate accurate use of the pulse oximeter.;To learn and demonstrate proper pursed lip breathing techniques or other breathing techniques. ;To learn and exhibit compliance with exercise, home and travel O2 prescription;To learn and demonstrate proper use of respiratory medications     Long  Term Goals Exhibits proper breathing techniques, such as pursed lip breathing or other method taught during program session Exhibits proper breathing techniques, such as pursed lip breathing or other method taught during program session;Maintenance of O2 saturations>88% Exhibits compliance with exercise, home  and travel O2 prescription;Maintenance of O2 saturations>88%;Verbalizes importance of monitoring SPO2 with pulse oximeter and return demonstration;Exhibits proper breathing techniques, such as pursed lip breathing or other method taught during program session;Demonstrates proper use of MDI's;Compliance with respiratory medication     Comments Reviewed PLB technique with pt.  Talked about how it works and it's importance in maintaining their exercise saturations. Danvhas a pulse oximeter to check his oxygen saturation at home. Informed and explained why it is important to have one. Reviewed that oxygen saturations should be 88 percent and above. He states that he does not check it often and can feel when his oxygen is low. Jose Ray is doing well in rehab.  He left his oxygen in the car today, but is usually good about wearing it.  He keeps it on at home.  He is using his pulse oximeter at home to keep close eye on saturations.  He uses his nebulizers twice a day and doing well with it.     Goals/Expected Outcomes Short: Become more profiecient at using PLB.   Long: Become independent at using PLB. Short: monitor oxygen at home with exertion. Long: maintain oxygen saturations above 88 percent independently. Short: Remember to get oxygen out of car Long: Continue to use PLB for breathing              Oxygen Discharge  (Final Oxygen Re-Evaluation):  Oxygen Re-Evaluation - 05/03/22 0957       Program Oxygen Prescription   Program Oxygen Prescription Continuous;E-Tanks    Liters per minute 4      Home Oxygen   Home Oxygen Device Portable Concentrator;E-Tanks;Home Concentrator    Sleep Oxygen Prescription Continuous    Liters per minute 3    Home  Exercise Oxygen Prescription Continuous    Liters per minute 3    Home Resting Oxygen Prescription None    Compliance with Home Oxygen Use Yes      Goals/Expected Outcomes   Short Term Goals To learn and understand importance of maintaining oxygen saturations>88%;To learn and understand importance of monitoring SPO2 with pulse oximeter and demonstrate accurate use of the pulse oximeter.;To learn and demonstrate proper pursed lip breathing techniques or other breathing techniques. ;To learn and exhibit compliance with exercise, home and travel O2 prescription;To learn and demonstrate proper use of respiratory medications    Long  Term Goals Exhibits compliance with exercise, home  and travel O2 prescription;Maintenance of O2 saturations>88%;Verbalizes importance of monitoring SPO2 with pulse oximeter and return demonstration;Exhibits proper breathing techniques, such as pursed lip breathing or other method taught during program session;Demonstrates proper use of MDI's;Compliance with respiratory medication    Comments Jose Ray is doing well in rehab.  He left his oxygen in the car today, but is usually good about wearing it.  He keeps it on at home.  He is using his pulse oximeter at home to keep close eye on saturations.  He uses his nebulizers twice a day and doing well with it.    Goals/Expected Outcomes Short: Remember to get oxygen out of car Long: Continue to use PLB for breathing             Initial Exercise Prescription:  Initial Exercise Prescription - 03/02/22 1300       Date of Initial Exercise RX and Referring Provider   Date 03/02/22    Referring  Provider Wallene Huh MD      Oxygen   Oxygen Continuous    Liters 2   Sending request to doctor to increase O2 Rx   Maintain Oxygen Saturation 88% or higher      Treadmill   MPH 1.8    Grade 0    Minutes 15    METs 2.38      Recumbant Bike   Level 1    RPM 60    Watts 10    Minutes 15    METs 2.2      REL-XR   Level 1    Speed 50    Minutes 15    METs 2.2      T5 Nustep   Level 1    SPM 80    Minutes 15    METs 2.2      Prescription Details   Frequency (times per week) 3    Duration Progress to 30 minutes of continuous aerobic without signs/symptoms of physical distress      Intensity   THRR 40-80% of Max Heartrate 101 - 127    Ratings of Perceived Exertion 11-13    Perceived Dyspnea 0-4      Progression   Progression Continue to progress workloads to maintain intensity without signs/symptoms of physical distress.      Resistance Training   Training Prescription Yes    Weight 3 lb    Reps 10-15             Perform Capillary Blood Glucose checks as needed.  Exercise Prescription Changes:   Exercise Prescription Changes     Row Name 03/02/22 1300 03/17/22 0700 03/29/22 1500 04/12/22 0800 04/16/22 0900     Response to Exercise   Blood Pressure (Admit) 132/72 124/64 144/74 132/82 --   Blood Pressure (Exercise) 150/72 136/74 138/68 142/78 --   Blood Pressure (  Exit) 128/70 102/60 112/66 122/60 --   Heart Rate (Admit) 75 bpm 86 bpm 83 bpm 92 bpm --   Heart Rate (Exercise) 96 bpm 90 bpm 101 bpm 102 bpm --   Heart Rate (Exit) 70 bpm 85 bpm 90 bpm 83 bpm --   Oxygen Saturation (Admit) 92 % 90 % 97 % 90 % --   Oxygen Saturation (Exercise) 83 % 88 % 88 % 88 % --   Oxygen Saturation (Exit) 94 % 91 % 92 % 93 % --   Rating of Perceived Exertion (Exercise) 13 11 12 13  --   Perceived Dyspnea (Exercise) 3 -- 4 3 --   Symptoms SOB -- SOB SOB --   Comments walk test results first day of exercise -- -- --   Duration -- Progress to 30 minutes of  aerobic  without signs/symptoms of physical distress Continue with 30 min of aerobic exercise without signs/symptoms of physical distress. Continue with 30 min of aerobic exercise without signs/symptoms of physical distress. --   Intensity -- THRR unchanged THRR unchanged THRR unchanged --     Progression   Progression -- Continue to progress workloads to maintain intensity without signs/symptoms of physical distress. Continue to progress workloads to maintain intensity without signs/symptoms of physical distress. Continue to progress workloads to maintain intensity without signs/symptoms of physical distress. --   Average METs -- 2.23 2.56 2.44 --     Resistance Training   Training Prescription -- Yes Yes Yes --   Weight -- 3 lb 3 lb 3 lb --   Reps -- 10-15 10-15 10-15 --     Oxygen   Oxygen -- Continuous Continuous Continuous --   Liters -- 4 4 4  --     Treadmill   MPH -- 1.6 2 2  --   Grade -- 0 0 0 --   Minutes -- 15 15 15  --   METs -- 2.23 2.53 2.53 --     Recumbant Bike   Level -- -- 2 2 --   Watts -- -- 22 22 --   Minutes -- -- 15 15 --   METs -- -- 2.33 2.33 --     REL-XR   Level -- 1 2 2  --   Minutes -- 15 15 15  --   METs -- -- 2 3 --     T5 Nustep   Level -- -- 1 3 --   Minutes -- -- 15 15 --   METs -- -- 2.34 1.9 --     Home Exercise Plan   Plans to continue exercise at -- -- -- -- Home (comment)  walking, staff videos   Frequency -- -- -- -- Add 2 additional days to program exercise sessions.   Initial Home Exercises Provided -- -- -- -- 04/16/22     Oxygen   Maintain Oxygen Saturation -- 88% or higher 88% or higher 88% or higher --    Row Name 04/26/22 0800 05/11/22 0800           Response to Exercise   Blood Pressure (Admit) 122/62 126/64      Blood Pressure (Exercise) 126/70 --      Blood Pressure (Exit) 108/60 122/62      Heart Rate (Admit) 86 bpm 77 bpm      Heart Rate (Exercise) 92 bpm 93 bpm      Heart Rate (Exit) 89 bpm 81 bpm      Oxygen Saturation  (Admit) 93 % 91 %  Oxygen Saturation (Exercise) 88 % 88 %      Oxygen Saturation (Exit) 94 % 93 %      Rating of Perceived Exertion (Exercise) 14 15      Perceived Dyspnea (Exercise) 3 2      Symptoms SOB SOB      Duration Continue with 30 min of aerobic exercise without signs/symptoms of physical distress. Continue with 30 min of aerobic exercise without signs/symptoms of physical distress.      Intensity THRR unchanged THRR unchanged        Progression   Progression Continue to progress workloads to maintain intensity without signs/symptoms of physical distress. Continue to progress workloads to maintain intensity without signs/symptoms of physical distress.      Average METs 2.4 1.89        Resistance Training   Training Prescription Yes Yes      Weight 3 lb 3 lb      Reps 10-15 10-15        Oxygen   Oxygen Continuous Continuous      Liters 4 4        Treadmill   MPH 1.8 1.8      Grade 0 0      Minutes 15 15      METs 2.38 2.38        Recumbant Bike   Level 2 --      Watts 20 --      Minutes 15 --      METs 2.34 --        Arm Ergometer   Level -- 1      Minutes -- 15      METs -- 1        REL-XR   Level 2 2      Minutes 15 15      METs 3.2 --        T5 Nustep   Level 2 --      Minutes 15 --      METs 1.9 --        Home Exercise Plan   Plans to continue exercise at Home (comment)  walking, staff videos Home (comment)  walking, staff videos      Frequency Add 2 additional days to program exercise sessions. Add 2 additional days to program exercise sessions.      Initial Home Exercises Provided 04/16/22 04/16/22        Oxygen   Maintain Oxygen Saturation 88% or higher 88% or higher               Exercise Comments:   Exercise Comments     Row Name 03/12/22 0943           Exercise Comments First full day of exercise!  Patient was oriented to gym and equipment including functions, settings, policies, and procedures.  Patient's individual  exercise prescription and treatment plan were reviewed.  All starting workloads were established based on the results of the 6 minute walk test done at initial orientation visit.  The plan for exercise progression was also introduced and progression will be customized based on patient's performance and goals.                Exercise Goals and Review:   Exercise Goals     Row Name 03/02/22 1402             Exercise Goals   Increase Physical Activity Yes  Intervention Provide advice, education, support and counseling about physical activity/exercise needs.;Develop an individualized exercise prescription for aerobic and resistive training based on initial evaluation findings, risk stratification, comorbidities and participant's personal goals.       Expected Outcomes Short Term: Attend rehab on a regular basis to increase amount of physical activity.;Long Term: Add in home exercise to make exercise part of routine and to increase amount of physical activity.;Long Term: Exercising regularly at least 3-5 days a week.       Increase Strength and Stamina Yes       Intervention Provide advice, education, support and counseling about physical activity/exercise needs.;Develop an individualized exercise prescription for aerobic and resistive training based on initial evaluation findings, risk stratification, comorbidities and participant's personal goals.       Expected Outcomes Short Term: Increase workloads from initial exercise prescription for resistance, speed, and METs.;Short Term: Perform resistance training exercises routinely during rehab and add in resistance training at home;Long Term: Improve cardiorespiratory fitness, muscular endurance and strength as measured by increased METs and functional capacity (6MWT)       Able to understand and use rate of perceived exertion (RPE) scale Yes       Intervention Provide education and explanation on how to use RPE scale       Expected Outcomes  Short Term: Able to use RPE daily in rehab to express subjective intensity level;Long Term:  Able to use RPE to guide intensity level when exercising independently       Able to understand and use Dyspnea scale Yes       Intervention Provide education and explanation on how to use Dyspnea scale       Expected Outcomes Short Term: Able to use Dyspnea scale daily in rehab to express subjective sense of shortness of breath during exertion;Long Term: Able to use Dyspnea scale to guide intensity level when exercising independently       Knowledge and understanding of Target Heart Rate Range (THRR) Yes       Intervention Provide education and explanation of THRR including how the numbers were predicted and where they are located for reference       Expected Outcomes Short Term: Able to state/look up THRR;Short Term: Able to use daily as guideline for intensity in rehab;Long Term: Able to use THRR to govern intensity when exercising independently       Able to check pulse independently Yes       Intervention Provide education and demonstration on how to check pulse in carotid and radial arteries.;Review the importance of being able to check your own pulse for safety during independent exercise       Expected Outcomes Short Term: Able to explain why pulse checking is important during independent exercise;Long Term: Able to check pulse independently and accurately       Understanding of Exercise Prescription Yes       Intervention Provide education, explanation, and written materials on patient's individual exercise prescription       Expected Outcomes Short Term: Able to explain program exercise prescription;Long Term: Able to explain home exercise prescription to exercise independently                Exercise Goals Re-Evaluation :  Exercise Goals Re-Evaluation     Row Name 03/12/22 0943 03/17/22 0730 03/29/22 1517 04/12/22 0905 04/16/22 0937     Exercise Goal Re-Evaluation   Exercise Goals Review  Able to understand and use rate of perceived exertion (RPE) scale;Able  to understand and use Dyspnea scale;Knowledge and understanding of Target Heart Rate Range (THRR);Understanding of Exercise Prescription Understanding of Exercise Prescription;Increase Physical Activity;Increase Strength and Stamina Understanding of Exercise Prescription;Increase Physical Activity;Increase Strength and Stamina Understanding of Exercise Prescription;Increase Physical Activity;Increase Strength and Stamina Understanding of Exercise Prescription;Increase Physical Activity;Increase Strength and Stamina;Able to understand and use rate of perceived exertion (RPE) scale;Able to understand and use Dyspnea scale;Knowledge and understanding of Target Heart Rate Range (THRR);Able to check pulse independently   Comments Reviewed RPE and dyspnea scales, THR and program prescription with pt today.  Pt voiced understanding and was given a copy of goals to take home. Jose Ray is doing well to start rehab. He completed his first day of exercise. He did well on the treadmill and XR. His doctor also prescribed him 4 liters of oxygen during exercise. We will continue to monitor Jose Ray's progress in the program. Jose Ray continues to do well in rehab. He has increased to level 2 on the recumbant bike and XR. He would benefit from adding on a small incline to the treadmill. Will continue to monitor workloads and oxygen saturations to stay above 88%. Jose Ray is doing well in rehab. He recently increased to level 3 on the T5 machine. He also improved to 3 METs on the XR machine. Jose Ray has tolerated using 3 lb hand weights for resistance training as well. We will continue to monitor his progress in the program. Reviewed home exercise with pt today.  Pt plans to walk and use staff videos at home for exercise.  Reviewed THR, pulse, RPE, sign and symptoms, pulse oximetery and when to call 911 or MD.  Also discussed weather considerations and indoor options.  Pt voiced  understanding.   Expected Outcomes Short: Use RPE daily to regulate intensity. Long: Follow program prescription in THR. Short: Continue to come to rehab. Long: Continue to increase strength and stamina. Short: Add incline on treadmill Long: Continue to increase overall MEt level Short: add incline on treadmill. Long: Continue to improve strength and stamina. Short: Add in more walking at home Long: Conitnue to exercise independently    [place] Name 04/26/22 0841 05/03/22 0949 05/11/22 0843         Exercise Goal Re-Evaluation   Exercise Goals Review Increase Physical Activity;Increase Strength and Stamina;Understanding of Exercise Prescription Increase Physical Activity;Increase Strength and Stamina;Understanding of Exercise Prescription Increase Physical Activity;Increase Strength and Stamina;Understanding of Exercise Prescription     Comments Jose Ray is doing well in rehab. He improved back to level 2 on the XR. He also improved to 2.34 METs using the recumbent bike. Jose Ray has also tolerated using 3 lb hand weights for resistance training. We will continue to monitor his progress in the program. Jose Ray is doing well in rehab.  He is walking some at home for 1 mile around the yard with the dog twice a day.  He is getting better with his stamina.  He is feeling better and planning to go to mountains next week. Jose Ray is doing well in rehab. He has done well at a speed of 1.8 mph on the treadmill. He would benefit from adding a little bit of an incline as well. He also began to use the arm ergometer and did well with it as well. We will continue to monitor his progress in the program.     Expected Outcomes Short: increase speed on the treadmill. Long: Continue to improve strength and stamina. Short: Continue to walk on off days Long; continue to improve stamina  Short: add incline on the treadmill. Long: Continue to improve strength and stamina.              Discharge Exercise Prescription (Final Exercise  Prescription Changes):  Exercise Prescription Changes - 05/11/22 0800       Response to Exercise   Blood Pressure (Admit) 126/64    Blood Pressure (Exit) 122/62    Heart Rate (Admit) 77 bpm    Heart Rate (Exercise) 93 bpm    Heart Rate (Exit) 81 bpm    Oxygen Saturation (Admit) 91 %    Oxygen Saturation (Exercise) 88 %    Oxygen Saturation (Exit) 93 %    Rating of Perceived Exertion (Exercise) 15    Perceived Dyspnea (Exercise) 2    Symptoms SOB    Duration Continue with 30 min of aerobic exercise without signs/symptoms of physical distress.    Intensity THRR unchanged      Progression   Progression Continue to progress workloads to maintain intensity without signs/symptoms of physical distress.    Average METs 1.89      Resistance Training   Training Prescription Yes    Weight 3 lb    Reps 10-15      Oxygen   Oxygen Continuous    Liters 4      Treadmill   MPH 1.8    Grade 0    Minutes 15    METs 2.38      Arm Ergometer   Level 1    Minutes 15    METs 1      REL-XR   Level 2    Minutes 15      Home Exercise Plan   Plans to continue exercise at Home (comment)   walking, staff videos   Frequency Add 2 additional days to program exercise sessions.    Initial Home Exercises Provided 04/16/22      Oxygen   Maintain Oxygen Saturation 88% or higher             Nutrition:  Target Goals: Understanding of nutrition guidelines, daily intake of sodium 1500mg , cholesterol 200mg , calories 30% from fat and 7% or less from saturated fats, daily to have 5 or more servings of fruits and vegetables.  Education: All About Nutrition: -Group instruction provided by verbal, written material, interactive activities, discussions, models, and posters to present general guidelines for heart healthy nutrition including fat, fiber, MyPlate, the role of sodium in heart healthy nutrition, utilization of the nutrition label, and utilization of this knowledge for meal planning.  Follow up email sent as well. Written material given at graduation. Flowsheet Row Pulmonary Rehab from 06/24/2021 in [facility] Card[facility] Pul[facility] Rehab  Date 04/22/21  Educator [facility]  Instruction Review Code 1- Verbalizes Understanding       Biometrics:  Pre Biometrics - 03/02/22 1355       Pre Biometrics   Height 6' (1.829 m)    Weight 203 lb 14.4 oz (92.5 kg)    BMI (Calculated) 27.65    Single Leg Stand 1.37 seconds              Nutrition Therapy Plan and Nutrition Goals:  Nutrition Therapy & Goals - 04/26/22 0848       Nutrition Therapy   Diet Heart healthy, low Na, diabetes friendly, pulmonary MNT    Drug/Food Interactions Statins/Certain Fruits    Protein (specify units) 110-115g    Fiber 30 grams    Whole Grain Foods 3 servings  Saturated Fats 16 max. grams    Fruits and Vegetables 8 servings/day    Sodium 2 grams      Personal Nutrition Goals   Nutrition Goal ST: switch up vegetables at dinner. add nuts with breakfast LT: increase overall variety of plant foods, continue with changes made    Comments 81 y.o. M admitted to pulmonary rehab for pulmonary emphysema. PMHx includes CAD, HTN, T2DM. Relevant medications includes lipitor, vit D, vit B12, lexapro, pepcid, glipizide, hydrochlorothiazide, metformin, crestor, B complex vitamin, vit A/vit C/vitE/zinc/copper.  PYP Score: 60. Vegetables & Fruits 10/12. Breads, Grains & Cereals 7/12. Red & Processed Meat 8/12. Poultry 2/2. Fish & Shellfish 0/4. Beans, Nuts & Seeds 3/4. Milk & Dairy Foods 1/6. Toppings, Oils, Seasonings & Salt 13/20. Sweets, Snacks & Restaurant Food 7/14. Beverages 9/10.  He reports that since graduating rehab the last time around he has been trying to eat a well-balanced diet. S: boiled egg before breakfast B: cold cereal (cheerios and rice krispies, chex) with berries and milk (whole milk - he reports not enjoying lower fat milk). on the weekend he will have eggs. OJ with Pills. L: sandwich (ham and  cheese on whole wheat bread) he dilutes his tea 1:4 (unsweeteded) D: meat (chicken, fish, red meat - lean meat) with vegetable (broccoli, mixed frozen vegetables, etc) with small dessert (nothing rich). He does the cooking for himself and his wife; generally he does not use fat with cooking - he uses some olive oil or canola oil or he uses broth to help with cooking, he does not salt his food during or after cooking. He was having difficulty during our conversation remebering some specific things about his diet. He reports not snacking most of the day - this is something he has worked on for a while. Drinks: unsweetened tea he has diluted and sometimes some lemonade in this mixture. He reports his BG "wanders around" - he reports not checking his BG regularly right now. His last A1C was 7.1 in March. Reviewed heart healthy eating, T2DM MNT, and pulmonary MNT. He would not like to make any changes at this time; encouraged to eat good source of protein at each meal and to increase variety of plant foods.      Intervention Plan   Intervention Prescribe, educate and counsel regarding individualized specific dietary modifications aiming towards targeted core components such as weight, hypertension, lipid management, diabetes, heart failure and other comorbidities.    Expected Outcomes Short Term Goal: Understand basic principles of dietary content, such as calories, fat, sodium, cholesterol and nutrients.;Short Term Goal: A plan has been developed with personal nutrition goals set during dietitian appointment.;Long Term Goal: Adherence to prescribed nutrition plan.             Nutrition Assessments:  MEDIFICTS Score Key: ?70 Need to make dietary changes  40-70 Heart Healthy Diet ? 40 Therapeutic Level Cholesterol Diet  Flowsheet Row Pulmonary Rehab from 03/02/2022 in [facility] Cardiac and Pulmonary Rehab  Picture Your Plate Total Score on Admission 60      Picture Your Plate Scores: <92 Unhealthy  dietary pattern with much room for improvement. 41-50 Dietary pattern unlikely to meet recommendations for good health and room for improvement. 51-60 More healthful dietary pattern, with some room for improvement.  >60 Healthy dietary pattern, although there may be some specific behaviors that could be improved.   Nutrition Goals Re-Evaluation:  Nutrition Goals Re-Evaluation     Row Name 04/07/22 [phone] 05/03/22 [phone]  Goals   Current Weight 207 lb (93.9 kg) --      Nutrition Goal lose some weight ST: switch up vegetables at dinner. add nuts with breakfast LT: increase overall variety of plant foods, continue with changes made      Comment Patient was informed on why it is important to maintain a balanced diet when dealing with Respiratory issues. Explained that it takes a lot of energy to breath and when they are short of breath often they will need to have a good diet to help keep up with the calories they are expending for breathing. Dan just met with dietitian and has started to adapt changes by adding in more vegetables at dinner. He has not added the nuts at breakfast consistently yet, but he is using them as a snack in the mornings.      Expected Outcome Short: Choose and plan snacks accordingly to patients caloric intake to improve breathing. Long: Maintain a diet independently that meets their caloric intake to aid in daily shortness of breath. Short; continue to adapt changes Long: conitnue to eat a good vareity.               Nutrition Goals Discharge (Final Nutrition Goals Re-Evaluation):  Nutrition Goals Re-Evaluation - 05/03/22 0953       Goals   Nutrition Goal ST: switch up vegetables at dinner. add nuts with breakfast LT: increase overall variety of plant foods, continue with changes made    Comment Dan just met with dietitian and has started to adapt changes by adding in more vegetables at dinner. He has not added the nuts at breakfast consistently yet, but he  is using them as a snack in the mornings.    Expected Outcome Short; continue to adapt changes Long: conitnue to eat a good vareity.             Psychosocial: Target Goals: Acknowledge presence or absence of significant depression and/or stress, maximize coping skills, provide positive support system. Participant is able to verbalize types and ability to use techniques and skills needed for reducing stress and depression.   Education: Stress, Anxiety, and Depression - Group verbal and visual presentation to define topics covered.  Reviews how body is impacted by stress, anxiety, and depression.  Also discusses healthy ways to reduce stress and to treat/manage anxiety and depression.  Written material given at graduation. Flowsheet Row Pulmonary Rehab from 06/24/2021 in [facility] Cardiac and Pulmonary Rehab  Date 05/20/21  Educator [facility]  Instruction Review Code 1- Verbalizes Understanding       Education: Sleep Hygiene -Provides group verbal and written instruction about how sleep can affect your health.  Define sleep hygiene, discuss sleep cycles and impact of sleep habits. Review good sleep hygiene tips.    Initial Review & Psychosocial Screening:  Initial Psych Review & Screening - 02/08/22 1017       Initial Review   Current issues with Current Psychotropic Meds      Family Dynamics   Good Support System? Yes    Comments Jose Ray has a great support system. His wife, daughter that lives down the street and his other sons call him and visit. He has a positive outlook on his health. He has done the program a few times and is looking to get into shape for his breathing.      Barriers   Psychosocial barriers to participate in program The patient should benefit from training in stress management and relaxation.  Screening Interventions   Interventions To provide support and resources with identified psychosocial needs;Provide feedback about the scores to participant;Encouraged to  exercise    Expected Outcomes Short Term goal: Utilizing psychosocial counselor, staff and physician to assist with identification of specific Stressors or current issues interfering with healing process. Setting desired goal for each stressor or current issue identified.;Long Term Goal: Stressors or current issues are controlled or eliminated.;Short Term goal: Identification and review with participant of any Quality of Life or Depression concerns found by scoring the questionnaire.;Long Term goal: The participant improves quality of Life and PHQ9 Scores as seen by post scores and/or verbalization of changes             Quality of Life Scores:  Scores of 19 and below usually indicate a poorer quality of life in these areas.  A difference of  2-3 points is a clinically meaningful difference.  A difference of 2-3 points in the total score of the Quality of Life Index has been associated with significant improvement in overall quality of life, self-image, physical symptoms, and general health in studies assessing change in quality of life.  PHQ-9: Review Flowsheet       03/02/2022 06/17/2021 04/29/2021 03/30/2021  Depression screen PHQ 2/9  Decreased Interest 0 1 0 0  Down, Depressed, Hopeless 0 0 0 0  PHQ - 2 Score 0 1 0 0  Altered sleeping 0 0 1 2  Tired, decreased energy 1 2 0 3  Change in appetite 1 0 0 1  Feeling bad or failure about yourself  0 0 0 0  Trouble concentrating 0 $RemoveBeforeD'2 3 2  'zZwHNaycSsRpkq$ Moving slowly or fidgety/restless 0 0 0 2  Suicidal thoughts 0 0 0 0  PHQ-9 Score $RemoveBef'2 5 4 10  'CLWqwNlxpC$ Difficult doing work/chores Not difficult at all Not difficult at all Not difficult at all Somewhat difficult   Interpretation of Total Score  Total Score Depression Severity:  1-4 = Minimal depression, 5-9 = Mild depression, 10-14 = Moderate depression, 15-19 = Moderately severe depression, 20-27 = Severe depression   Psychosocial Evaluation and Intervention:  Psychosocial Evaluation - 02/08/22 1019        Psychosocial Evaluation & Interventions   Interventions Encouraged to exercise with the program and follow exercise prescription;Stress management education;Relaxation education    Comments Jose Ray has a great support system. His wife, daughter that lives down the street and his other sons call him and visit. He has a positive outlook on his health. He has done the program a few times and is looking to get into shape for his breathing.    Expected Outcomes Short: Start [facility] to help with mood. Long: Maintain a healthy mental state    Continue Psychosocial Services  Follow up required by staff             Psychosocial Re-Evaluation:  Psychosocial Re-Evaluation     [place] Name 04/07/22 [phone] 05/03/22 7741           Psychosocial Re-Evaluation   Current issues with None Identified None Identified;Current Psychotropic Meds      Comments Patient reports no issues with their current mental states, sleep, stress, depression or anxiety. Will follow up with patient in a few weeks for any changes. Jose Ray is going on vacation to the mountains.  He is looking forward to his trip.  He is feleing good mentally and balanced with his mood.  He tries to stay positive for the most part.  He is sleeping well  even with being up around 6am with dog.      Expected Outcomes Short: Continue to exercise regularly to support mental health and notify staff of any changes. Long: maintain mental health and well being through teaching of rehab or prescribed medications independently. Short: Enjoy trip to [place]: continue to stay positive      Interventions Encouraged to attend Pulmonary Rehabilitation for the exercise Encouraged to attend Pulmonary Rehabilitation for the exercise      Continue Psychosocial Services  Follow up required by staff Follow up required by staff               Psychosocial Discharge (Final Psychosocial Re-Evaluation):  Psychosocial Re-Evaluation - 05/03/22 0952       Psychosocial  Re-Evaluation   Current issues with None Identified;Current Psychotropic Meds    Comments Jose Ray is going on vacation to the mountains.  He is looking forward to his trip.  He is feleing good mentally and balanced with his mood.  He tries to stay positive for the most part.  He is sleeping well even with being up around 6am with dog.    Expected Outcomes Short: Enjoy trip to mountains Long: continue to stay positive    Interventions Encouraged to attend Pulmonary Rehabilitation for the exercise    Continue Psychosocial Services  Follow up required by staff             Education: Education Goals: Education classes will be provided on a weekly basis, covering required topics. Participant will state understanding/return demonstration of topics presented.  Learning Barriers/Preferences:  Learning Barriers/Preferences - 02/08/22 1017       Learning Barriers/Preferences   Learning Barriers None    Learning Preferences None             General Pulmonary Education Topics:  Infection Prevention: - Provides verbal and written material to individual with discussion of infection control including proper hand washing and proper equipment cleaning during exercise session. Flowsheet Row Pulmonary Rehab from 05/12/2022 in [facility] Cardiac and Pulmonary Rehab  Date 02/08/22  Educator [facility]  Instruction Review Code 1- Verbalizes Understanding       Falls Prevention: - Provides verbal and written material to individual with discussion of falls prevention and safety. Flowsheet Row Pulmonary Rehab from 05/12/2022 in [facility] Cardiac and Pulmonary Rehab  Date 02/08/22  Educator [facility]  Instruction Review Code 1- Verbalizes Understanding       Chronic Lung Disease Review: - Group verbal instruction with posters, models, PowerPoint presentations and videos,  to review new updates, new respiratory medications, new advancements in procedures and treatments. Providing information on websites and "800" numbers  for continued self-education. Includes information about supplement oxygen, available portable oxygen systems, continuous and intermittent flow rates, oxygen safety, concentrators, and Medicare reimbursement for oxygen. Explanation of Pulmonary Drugs, including class, frequency, complications, importance of spacers, rinsing mouth after steroid MDI's, and proper cleaning methods for nebulizers. Review of basic lung anatomy and physiology related to function, structure, and complications of lung disease. Review of risk factors. Discussion about methods for diagnosing sleep apnea and types of masks and machines for OSA. Includes a review of the use of types of environmental controls: home humidity, furnaces, filters, dust mite/pet prevention, HEPA vacuums. Discussion about weather changes, air quality and the benefits of nasal washing. Instruction on Warning signs, infection symptoms, calling MD promptly, preventive modes, and value of vaccinations. Review of effective airway clearance, coughing and/or vibration techniques. Emphasizing that all should Create an Action Plan.  Written material given at graduation. Flowsheet Row Pulmonary Rehab from 05/12/2022 in [facility] Cardiac and Pulmonary Rehab  Education need identified 03/02/22  Date 03/24/22  Educator [facility]  Instruction Review Code 1- Verbalizes Understanding       AED/CPR: - Group verbal and written instruction with the use of models to demonstrate the basic use of the AED with the basic ABC's of resuscitation.    Anatomy and Cardiac Procedures: - Group verbal and visual presentation and models provide information about basic cardiac anatomy and function. Reviews the testing methods done to diagnose heart disease and the outcomes of the test results. Describes the treatment choices: Medical Management, Angioplasty, or Coronary Bypass Surgery for treating various heart conditions including Myocardial Infarction, Angina, Valve Disease, and Cardiac  Arrhythmias.  Written material given at graduation. Flowsheet Row Pulmonary Rehab from 05/12/2022 in [facility] Cardiac and Pulmonary Rehab  Date 04/21/22  Educator SB  Instruction Review Code 1- Verbalizes Understanding       Medication Safety: - Group verbal and visual instruction to review commonly prescribed medications for heart and lung disease. Reviews the medication, class of the drug, and side effects. Includes the steps to properly store meds and maintain the prescription regimen.  Written material given at graduation. Flowsheet Row Pulmonary Rehab from 05/12/2022 in [facility] Cardiac and Pulmonary Rehab  Date 05/12/22  Educator SB  Instruction Review Code 1- Verbalizes Understanding       Other: -Provides group and verbal instruction on various topics (see comments)   Knowledge Questionnaire Score:  Knowledge Questionnaire Score - 03/02/22 1404       Knowledge Questionnaire Score   Pre Score 15/18              Core Components/Risk Factors/Patient Goals at Admission:  Personal Goals and Risk Factors at Admission - 03/02/22 1402       Core Components/Risk Factors/Patient Goals on Admission    Weight Management Yes;Weight Loss    Intervention Weight Management: Develop a combined nutrition and exercise program designed to reach desired caloric intake, while maintaining appropriate intake of nutrient and fiber, sodium and fats, and appropriate energy expenditure required for the weight goal.;Weight Management: Provide education and appropriate resources to help participant work on and attain dietary goals.;Weight Management/Obesity: Establish reasonable short term and long term weight goals.    Admit Weight 203 lb (92.1 kg)    Goal Weight: Short Term 198 lb (89.8 kg)    Goal Weight: Long Term 193 lb (87.5 kg)    Expected Outcomes Short Term: Continue to assess and modify interventions until short term weight is achieved;Long Term: Adherence to nutrition and physical  activity/exercise program aimed toward attainment of established weight goal;Weight Loss: Understanding of general recommendations for a balanced deficit meal plan, which promotes 1-2 lb weight loss per week and includes a negative energy balance of [id number] kcal/d;Understanding recommendations for meals to include 15-35% energy as protein, 25-35% energy from fat, 35-60% energy from carbohydrates, less than $RemoveB'200mg'qLGgkJjX$  of dietary cholesterol, 20-35 gm of total fiber daily;Understanding of distribution of calorie intake throughout the day with the consumption of 4-5 meals/snacks    Improve shortness of breath with ADL's Yes    Intervention Provide education, individualized exercise plan and daily activity instruction to help decrease symptoms of SOB with activities of daily living.    Expected Outcomes Short Term: Improve cardiorespiratory fitness to achieve a reduction of symptoms when performing ADLs;Long Term: Be able to perform more ADLs without symptoms or delay the onset of  symptoms    Diabetes Yes    Intervention Provide education about signs/symptoms and action to take for hypo/hyperglycemia.;Provide education about proper nutrition, including hydration, and aerobic/resistive exercise prescription along with prescribed medications to achieve blood glucose in normal ranges: Fasting glucose 65-99 mg/dL    Expected Outcomes Short Term: Participant verbalizes understanding of the signs/symptoms and immediate care of hyper/hypoglycemia, proper foot care and importance of medication, aerobic/resistive exercise and nutrition plan for blood glucose control.;Long Term: Attainment of HbA1C < 7%.    Heart Failure Yes    Intervention Provide a combined exercise and nutrition program that is supplemented with education, support and counseling about heart failure. Directed toward relieving symptoms such as shortness of breath, decreased exercise tolerance, and extremity edema.    Expected Outcomes Improve functional  capacity of life;Short term: Attendance in program 2-3 days a week with increased exercise capacity. Reported lower sodium intake. Reported increased fruit and vegetable intake. Reports medication compliance.;Short term: Daily weights obtained and reported for increase. Utilizing diuretic protocols set by physician.;Long term: Adoption of self-care skills and reduction of barriers for early signs and symptoms recognition and intervention leading to self-care maintenance.    Hypertension Yes    Intervention Provide education on lifestyle modifcations including regular physical activity/exercise, weight management, moderate sodium restriction and increased consumption of fresh fruit, vegetables, and low fat dairy, alcohol moderation, and smoking cessation.;Monitor prescription use compliance.    Expected Outcomes Short Term: Continued assessment and intervention until BP is < 140/20mm HG in hypertensive participants. < 130/69mm HG in hypertensive participants with diabetes, heart failure or chronic kidney disease.;Long Term: Maintenance of blood pressure at goal levels.    Lipids Yes    Intervention Provide education and support for participant on nutrition & aerobic/resistive exercise along with prescribed medications to achieve LDL '70mg'$ , HDL >$Remo'40mg'orASt$ .    Expected Outcomes Short Term: Participant states understanding of desired cholesterol values and is compliant with medications prescribed. Participant is following exercise prescription and nutrition guidelines.;Long Term: Cholesterol controlled with medications as prescribed, with individualized exercise RX and with personalized nutrition plan. Value goals: LDL < $Rem'70mg'SevV$ , HDL > 40 mg.             Education:Diabetes - Individual verbal and written instruction to review signs/symptoms of diabetes, desired ranges of glucose level fasting, after meals and with exercise. Acknowledge that pre and post exercise glucose checks will be done for 3 sessions at entry  of program. Flowsheet Row Pulmonary Rehab from 05/12/2022 in [facility] Cardiac and Pulmonary Rehab  Date 02/08/22  Educator [facility]  Instruction Review Code 1- Verbalizes Understanding       Know Your Numbers and Heart Failure: - Group verbal and visual instruction to discuss disease risk factors for cardiac and pulmonary disease and treatment options.  Reviews associated critical values for Overweight/Obesity, Hypertension, Cholesterol, and Diabetes.  Discusses basics of heart failure: signs/symptoms and treatments.  Introduces Heart Failure Zone chart for action plan for heart failure.  Written material given at graduation. Flowsheet Row Pulmonary Rehab from 05/12/2022 in [facility] Cardiac and Pulmonary Rehab  Date 03/17/22  Educator SB  Instruction Review Code 1- Verbalizes Understanding       Core Components/Risk Factors/Patient Goals Review:   Goals and Risk Factor Review     Row Name 04/07/22 [id number] 05/03/22 0955           Core Components/Risk Factors/Patient Goals Review   Personal Goals Review Improve shortness of breath with ADL's Improve shortness of breath with ADL's;Weight Management/Obesity;Increase  knowledge of respiratory medications and ability to use respiratory devices properly.;Hypertension;Diabetes      Review Spoke to patient about their shortness of breath and what they can do to improve. Patient has been informed of breathing techniques when starting the program. Patient is informed to tell staff if they have had any med changes and that certain meds they are taking or not taking can be causing shortness of breath. Jose Ray is doing well in rehab.  His weight has been holding steady recently.  HIs sugars are doing well and he checks them on occasion at home.  His pressures are doing well and he is keeping a close eye on them.  He is doing better with his SOB, but does tend to forget to bring his oxygen into class with him.  He is doing well on his meds.  He is using his inhalers and  nebulizers routinely      Expected Outcomes Short: Attend [facility] regularly to improve shortness of breath with ADL's. Long: maintain independence with ADL's Short: Conitnue to maintain weight Long: Conitnue to montior risk factors.               Core Components/Risk Factors/Patient Goals at Discharge (Final Review):   Goals and Risk Factor Review - 05/03/22 0955       Core Components/Risk Factors/Patient Goals Review   Personal Goals Review Improve shortness of breath with ADL's;Weight Management/Obesity;Increase knowledge of respiratory medications and ability to use respiratory devices properly.;Hypertension;Diabetes    Review Jose Ray is doing well in rehab.  His weight has been holding steady recently.  HIs sugars are doing well and he checks them on occasion at home.  His pressures are doing well and he is keeping a close eye on them.  He is doing better with his SOB, but does tend to forget to bring his oxygen into class with him.  He is doing well on his meds.  He is using his inhalers and nebulizers routinely    Expected Outcomes Short: Conitnue to maintain weight Long: Conitnue to montior risk factors.             ITP Comments:  ITP Comments     Row Name 02/08/22 1026 03/02/22 0947 03/12/22 0943 03/24/22 1236 04/21/22 0812   ITP Comments In patient  Visit completed. Patient informed on EP and RD appointment and 6 Minute walk test. Patient also informed of patient health questionnaires on My Chart. Patient Verbalizes understanding. Visit diagnosis can be found in [facility] 12/08/2021. Completed 6MWT and gym orientation. Initial ITP created and sent for review to Dr. Ottie Glazier,  Medical Director. First full day of exercise!  Patient was oriented to gym and equipment including functions, settings, policies, and procedures.  Patient's individual exercise prescription and treatment plan were reviewed.  All starting workloads were established based on the results of the 6 minute walk test  done at initial orientation visit.  The plan for exercise progression was also introduced and progression will be customized based on patient's performance and goals. 30 Day review completed. Medical Director ITP review done, changes made as directed, and signed approval by Medical Director. 30 Day review completed. Medical Director ITP review done, changes made as directed, and signed approval by Medical Director.    Titus Name 04/26/22 1258 05/19/22 0717         ITP Comments Completed initial RD consultation 30 Day review completed. Medical Director ITP review done, changes made as directed, and signed approval by Medical Director.  Comments:

## 2022-05-24 ENCOUNTER — Encounter: Payer: Medicare Other | Admitting: *Deleted

## 2022-05-24 DIAGNOSIS — J439 Emphysema, unspecified: Secondary | ICD-10-CM | POA: Diagnosis not present

## 2022-05-24 NOTE — Progress Notes (Signed)
Daily Session Note  Patient Details  Name: Jose Ray MRN: [id number] Date of Birth: 06/12/1941 Referring Provider:   Flowsheet Row Pulmonary Rehab from 03/02/2022 in [facility] Cardiac and Pulmonary Rehab  Referring Provider Fleming, Herbon MD       Encounter Date: 05/24/2022  Check In:  Session Check In - 05/24/22 0930       Check-In   Supervising physician immediately available to respond to emergencies See telemetry face sheet for immediately available ER MD    Location [facility]-Cardiac & Pulmonary Rehab    Staff Present Susanne Bice, RN, BSN, CCRP;Kelly Hayes, BS, ACSM CEP, Exercise Physiologist;Laureen Brown, BS, RRT, CPFT    Virtual Visit No    Medication changes reported     No    Fall or balance concerns reported    No    Warm-up and Cool-down Performed on first and last piece of equipment    Resistance Training Performed Yes    VAD Patient? No    PAD/SET Patient? No      Pain Assessment   Currently in Pain? No/denies                Social History   Tobacco Use  Smoking Status Former   Packs/day: 1.00   Years: 35.00   Total pack years: 35.00   Types: Cigarettes   Quit date: 1995   Years since quitting: 28.6  Smokeless Tobacco Never    Goals Met:  Proper associated with RPD/PD & O2 Sat Independence with exercise equipment Exercise tolerated well No report of concerns or symptoms today  Goals Unmet:  Not Applicable  Comments: Pt able to follow exercise prescription today without complaint.  Will continue to monitor for progression.    Dr. Mark Miller is Medical Director for [facility].  Dr. Fuad Aleskerov is Medical Director for [facility]. 

## 2022-05-26 ENCOUNTER — Encounter: Payer: Medicare Other | Admitting: *Deleted

## 2022-05-26 DIAGNOSIS — J439 Emphysema, unspecified: Secondary | ICD-10-CM

## 2022-05-26 NOTE — Progress Notes (Signed)
Daily Session Note  Patient Details  Name: Jose Ray MRN: [id number] Date of Birth: 1940-11-08 Referring Provider:   Flowsheet Row Pulmonary Rehab from 03/02/2022 in [facility] Cardiac and Pulmonary Rehab  Referring Provider Wallene Huh MD       Encounter Date: 05/26/2022  Check In:  Session Check In - 05/26/22 0945       Check-In   Supervising physician immediately available to respond to emergencies See telemetry face sheet for immediately available ER MD    Location [facility]-Cardiac & Pulmonary Rehab    Staff Present Heath Lark, RN, BSN, CCRP;Jessica Dalton, MA, RCEP, CCRP, CCET;Noah Tickle, BS, Exercise Physiologist    Virtual Visit No    Medication changes reported     No    Fall or balance concerns reported    No    Warm-up and Cool-down Performed on first and last piece of equipment    Resistance Training Performed Yes    VAD Patient? No    PAD/SET Patient? No      Pain Assessment   Currently in Pain? No/denies                Social History   Tobacco Use  Smoking Status Former   Packs/day: 1.00   Years: 35.00   Total pack years: 35.00   Types: Cigarettes   Quit date: 1995   Years since quitting: 28.6  Smokeless Tobacco Never    Goals Met:  Proper associated with RPD/PD & O2 Sat Independence with exercise equipment Exercise tolerated well No report of concerns or symptoms today  Goals Unmet:  Not Applicable  Comments: Pt able to follow exercise prescription today without complaint.  Will continue to monitor for progression.    Dr. Emily Filbert is Medical Director for [place].  Dr. Ottie Glazier is Medical Director for [facility] Pulmonary Rehabilitation.

## 2022-05-28 ENCOUNTER — Encounter: Payer: Medicare Other | Admitting: *Deleted

## 2022-05-28 DIAGNOSIS — J439 Emphysema, unspecified: Secondary | ICD-10-CM | POA: Diagnosis not present

## 2022-05-28 NOTE — Progress Notes (Signed)
Daily Session Note  Patient Details  Name: Jose Ray MRN: [id number] Date of Birth: 1941/01/12 Referring Provider:   Flowsheet Row Pulmonary Rehab from 03/02/2022 in [facility] Cardiac and Pulmonary Rehab  Referring Provider Wallene Huh MD       Encounter Date: 05/28/2022  Check In:  Session Check In - 05/28/22 0923       Check-In   Supervising physician immediately available to respond to emergencies See telemetry face sheet for immediately available ER MD    Location [facility]-Cardiac & Pulmonary Rehab    Staff Present Alberteen Sam, MA, RCEP, CCRP, CCET;Joseph [place], RCP,RRT,BSRT;Valencia [place], RN BSN    Virtual Visit No    Medication changes reported     No    Fall or balance concerns reported    No    Warm-up and Cool-down Performed on first and last piece of equipment    Resistance Training Performed Yes    VAD Patient? No    PAD/SET Patient? No      Pain Assessment   Currently in Pain? No/denies                Social History   Tobacco Use  Smoking Status Former   Packs/day: 1.00   Years: 35.00   Total pack years: 35.00   Types: Cigarettes   Quit date: 1995   Years since quitting: 28.6  Smokeless Tobacco Never    Goals Met:  Independence with exercise equipment Exercise tolerated well No report of concerns or symptoms today Strength training completed today  Goals Unmet:  Not Applicable  Comments: Pt able to follow exercise prescription today without complaint.  Will continue to monitor for progression.    Dr. Emily Filbert is Medical Director for [place].  Dr. Ottie Glazier is Medical Director for [facility] Pulmonary Rehabilitation.

## 2022-05-31 ENCOUNTER — Encounter: Payer: Medicare Other | Admitting: *Deleted

## 2022-05-31 DIAGNOSIS — J439 Emphysema, unspecified: Secondary | ICD-10-CM | POA: Diagnosis not present

## 2022-05-31 NOTE — Progress Notes (Signed)
Daily Session Note  Patient Details  Name: Jose Ray MRN: [id number] Date of Birth: December 12, 1940 Referring Provider:   Flowsheet Row Pulmonary Rehab from 03/02/2022 in [facility] Cardiac and Pulmonary Rehab  Referring Provider Wallene Huh MD       Encounter Date: 05/31/2022  Check In:  Session Check In - 05/31/22 1004       Check-In   Supervising physician immediately available to respond to emergencies See telemetry face sheet for immediately available ER MD    Location [facility]-Cardiac & Pulmonary Rehab    Staff Present Heath Lark, RN, BSN, CCRP;Joseph [place], RCP,RRT,BSRT;Kelly [place], BS, ACSM CEP, Exercise Physiologist    Virtual Visit No    Medication changes reported     No    Fall or balance concerns reported    No    Warm-up and Cool-down Performed on first and last piece of equipment    Resistance Training Performed Yes    VAD Patient? No    PAD/SET Patient? No      Pain Assessment   Currently in Pain? No/denies                Social History   Tobacco Use  Smoking Status Former   Packs/day: 1.00   Years: 35.00   Total pack years: 35.00   Types: Cigarettes   Quit date: 1995   Years since quitting: 28.6  Smokeless Tobacco Never    Goals Met:  Proper associated with RPD/PD & O2 Sat Independence with exercise equipment Exercise tolerated well No report of concerns or symptoms today  Goals Unmet:  Not Applicable  Comments: Pt able to follow exercise prescription today without complaint.  Will continue to monitor for progression.    Dr. Emily Filbert is Medical Director for [place].  Dr. Ottie Glazier is Medical Director for [facility] Pulmonary Rehabilitation.

## 2022-06-02 ENCOUNTER — Encounter: Payer: Medicare Other | Admitting: *Deleted

## 2022-06-02 DIAGNOSIS — J439 Emphysema, unspecified: Secondary | ICD-10-CM | POA: Diagnosis not present

## 2022-06-02 NOTE — Progress Notes (Signed)
Daily Session Note  Patient Details  Name: Jose Ray MRN: [id number] Date of Birth: Mar 02, 1941 Referring Provider:   Flowsheet Row Pulmonary Rehab from 03/02/2022 in [facility] Cardiac and Pulmonary Rehab  Referring Provider Wallene Huh MD       Encounter Date: 06/02/2022  Check In:  Session Check In - 06/02/22 1012       Check-In   Supervising physician immediately available to respond to emergencies See telemetry face sheet for immediately available ER MD    Location [facility]-Cardiac & Pulmonary Rehab    Staff Present Heath Lark, RN, BSN, CCRP;Jessica [place], MA, RCEP, CCRP, CCET;Melissa Bynum, [place], LDN;Joseph [place], [place]    Virtual Visit No    Medication changes reported     No    Fall or balance concerns reported    No    Warm-up and Cool-down Performed on first and last piece of equipment    Resistance Training Performed Yes    VAD Patient? No    PAD/SET Patient? No      Pain Assessment   Currently in Pain? No/denies                Social History   Tobacco Use  Smoking Status Former   Packs/day: 1.00   Years: 35.00   Total pack years: 35.00   Types: Cigarettes   Quit date: 1995   Years since quitting: 28.6  Smokeless Tobacco Never    Goals Met:  Proper associated with RPD/PD & O2 Sat Independence with exercise equipment Exercise tolerated well No report of concerns or symptoms today  Goals Unmet:  Not Applicable  Comments: Pt able to follow exercise prescription today without complaint.  Will continue to monitor for progression. Forgot to bring  his oxygen today     Dr. Emily Filbert is Medical Director for [place].  Dr. Ottie Glazier is Medical Director for [facility] Pulmonary Rehabilitation.

## 2022-06-04 ENCOUNTER — Encounter: Payer: Medicare Other | Admitting: *Deleted

## 2022-06-04 DIAGNOSIS — J439 Emphysema, unspecified: Secondary | ICD-10-CM | POA: Diagnosis not present

## 2022-06-04 NOTE — Progress Notes (Signed)
Daily Session Note  Patient Details  Name: Jose Ray MRN: [id number] Date of Birth: 11/08/1940 Referring Provider:   Flowsheet Row Pulmonary Rehab from 03/02/2022 in [facility] Cardiac and Pulmonary Rehab  Referring Provider Fleming, Herbon MD       Encounter Date: 06/04/2022  Check In:  Session Check In - 06/04/22 0926       Check-In   Supervising physician immediately available to respond to emergencies See telemetry face sheet for immediately available ER MD    Location [facility]-Cardiac & Pulmonary Rehab    Staff Present Meredith Craven, RN BSN;Joseph [place], RCP,RRT,BSRT;Jessica [place], MA, RCEP, CCRP, CCET    Virtual Visit No    Medication changes reported     No    Fall or balance concerns reported    No    Warm-up and Cool-down Performed on first and last piece of equipment    Resistance Training Performed Yes    VAD Patient? No    PAD/SET Patient? No      Pain Assessment   Currently in Pain? No/denies                Social History   Tobacco Use  Smoking Status Former   Packs/day: 1.00   Years: 35.00   Total pack years: 35.00   Types: Cigarettes   Quit date: 1995   Years since quitting: 28.6  Smokeless Tobacco Never    Goals Met:  Independence with exercise equipment Exercise tolerated well No report of concerns or symptoms today Strength training completed today  Goals Unmet:  Not Applicable  Comments: Pt able to follow exercise prescription today without complaint.  Will continue to monitor for progression.    Dr. Mark Miller is Medical Director for [facility].  Dr. Fuad Aleskerov is Medical Director for [facility]. 

## 2022-06-07 ENCOUNTER — Encounter: Payer: Medicare Other | Admitting: *Deleted

## 2022-06-07 DIAGNOSIS — J439 Emphysema, unspecified: Secondary | ICD-10-CM | POA: Diagnosis not present

## 2022-06-07 NOTE — Progress Notes (Signed)
Daily Session Note  Patient Details  Name: Jose Ray MRN: [id number] Date of Birth: 05/16/1941 Referring Provider:   Flowsheet Row Pulmonary Rehab from 03/02/2022 in [facility] Cardiac and Pulmonary Rehab  Referring Provider Wallene Huh MD       Encounter Date: 06/07/2022  Check In:  Session Check In - 06/07/22 0936       Check-In   Supervising physician immediately available to respond to emergencies See telemetry face sheet for immediately available ER MD    Location [facility]-Cardiac & Pulmonary Rehab    Staff Present Heath Lark, RN, BSN, CCRP;Joseph [place], RCP,RRT,BSRT;Kelly [place], [place], ACSM CEP, Exercise Physiologist    Virtual Visit No    Medication changes reported     No    Fall or balance concerns reported    No    Warm-up and Cool-down Performed on first and last piece of equipment    Resistance Training Performed Yes    VAD Patient? No    PAD/SET Patient? No      Pain Assessment   Currently in Pain? No/denies                Social History   Tobacco Use  Smoking Status Former   Packs/day: 1.00   Years: 35.00   Total pack years: 35.00   Types: Cigarettes   Quit date: 1995   Years since quitting: 28.6  Smokeless Tobacco Never    Goals Met:  Proper associated with RPD/PD & O2 Sat Independence with exercise equipment Exercise tolerated well No report of concerns or symptoms today  Goals Unmet:  Not Applicable  Comments: Pt able to follow exercise prescription today without complaint.  Will continue to monitor for progression.    Dr. Emily Filbert is Medical Director for [place].  Dr. Ottie Glazier is Medical Director for [facility] Pulmonary Rehabilitation.

## 2022-06-09 ENCOUNTER — Encounter: Payer: Medicare Other | Admitting: *Deleted

## 2022-06-09 DIAGNOSIS — I5022 Chronic systolic (congestive) heart failure: Secondary | ICD-10-CM

## 2022-06-09 DIAGNOSIS — J439 Emphysema, unspecified: Secondary | ICD-10-CM | POA: Diagnosis not present

## 2022-06-09 NOTE — Progress Notes (Signed)
Daily Session Note  Patient Details  Name: Jose Ray MRN: [id number] Date of Birth: 1941-06-16 Referring Provider:   Flowsheet Row Pulmonary Rehab from 03/02/2022 in [facility] Cardiac and Pulmonary Rehab  Referring Provider Wallene Huh MD       Encounter Date: 06/09/2022  Check In:  Session Check In - 06/09/22 0955       Check-In   Supervising physician immediately available to respond to emergencies See telemetry face sheet for immediately available ER MD    Location [facility]-Cardiac & Pulmonary Rehab    Staff Present Nyoka Cowden, RN, BSN, Fenton Foy, BS, Exercise Physiologist;Joseph [place], RCP,RRT,BSRT;Melissa [place], [place], LDN    Virtual Visit No    Medication changes reported     No    Fall or balance concerns reported    No    Tobacco Cessation No Change    Warm-up and Cool-down Performed on first and last piece of equipment    Resistance Training Performed Yes    VAD Patient? No    PAD/SET Patient? No      Pain Assessment   Currently in Pain? No/denies                Social History   Tobacco Use  Smoking Status Former   Packs/day: 1.00   Years: 35.00   Total pack years: 35.00   Types: Cigarettes   Quit date: 1995   Years since quitting: 28.6  Smokeless Tobacco Never    Goals Met:  Independence with exercise equipment Exercise tolerated well No report of concerns or symptoms today  Goals Unmet:  Not Applicable  Comments: Pt able to follow exercise prescription today without complaint.  Will continue to monitor for progression.    Dr. Emily Filbert is Medical Director for [place].  Dr. Ottie Glazier is Medical Director for [facility].

## 2022-06-11 ENCOUNTER — Encounter: Payer: Medicare Other | Attending: Specialist | Admitting: *Deleted

## 2022-06-11 DIAGNOSIS — J439 Emphysema, unspecified: Secondary | ICD-10-CM | POA: Diagnosis present

## 2022-06-11 DIAGNOSIS — I5022 Chronic systolic (congestive) heart failure: Secondary | ICD-10-CM

## 2022-06-11 DIAGNOSIS — R0602 Shortness of breath: Secondary | ICD-10-CM | POA: Diagnosis not present

## 2022-06-11 NOTE — Progress Notes (Signed)
Daily Session Note  Patient Details  Name: Jose Ray MRN: [id number] Date of Birth: Feb 22, 1941 Referring Provider:   Flowsheet Row Pulmonary Rehab from 03/02/2022 in [facility] Cardiac and Pulmonary Rehab  Referring Provider Wallene Huh MD       Encounter Date: 06/11/2022  Check In:      Social History   Tobacco Use  Smoking Status Former   Packs/day: 1.00   Years: 35.00   Total pack years: 35.00   Types: Cigarettes   Quit date: 1995   Years since quitting: 28.6  Smokeless Tobacco Never    Goals Met:  Independence with exercise equipment Exercise tolerated well No report of concerns or symptoms today  Goals Unmet:  Not Applicable  Comments: Pt able to follow exercise prescription today without complaint.  Will continue to monitor for progression.    Dr. Emily Filbert is Medical Director for [place].  Dr. Ottie Glazier is Medical Director for [facility] Pulmonary Rehabilitation.

## 2022-06-16 ENCOUNTER — Encounter: Payer: Medicare Other | Admitting: *Deleted

## 2022-06-16 ENCOUNTER — Encounter: Payer: Self-pay | Admitting: *Deleted

## 2022-06-16 VITALS — Ht 72.0 in | Wt 210.5 lb

## 2022-06-16 DIAGNOSIS — J439 Emphysema, unspecified: Secondary | ICD-10-CM

## 2022-06-16 NOTE — Progress Notes (Signed)
Pulmonary Individual Treatment Plan  Patient Details  Name: Jose Ray MRN: [id number] Date of Birth: 07/29/1941 Referring Provider:   Flowsheet Row Pulmonary Rehab from 03/02/2022 in [facility] Cardiac and Pulmonary Rehab  Referring Provider Wallene Huh MD       Initial Encounter Date:  Flowsheet Row Pulmonary Rehab from 03/02/2022 in [facility] Cardiac and Pulmonary Rehab  Date 03/02/22       Visit Diagnosis: Pulmonary emphysema, unspecified emphysema type ([place])  Patient's Home Medications on Admission:  Current Outpatient Medications:    aspirin 81 MG tablet, Take 81 mg by mouth at bedtime., Disp: , Rfl:    atorvastatin (LIPITOR) 80 MG tablet, Take 80 mg by mouth at bedtime. (Patient not taking: Reported on 03/23/2021), Disp: , Rfl:    atorvastatin (LIPITOR) 80 MG tablet, Take 1 tablet by mouth at bedtime. (Patient not taking: Reported on 02/08/2022), Disp: , Rfl:    Cholecalciferol 25 MCG (1000 UT) capsule, Take by mouth., Disp: , Rfl:    cyanocobalamin 1000 MCG tablet, Take by mouth., Disp: , Rfl:    escitalopram (LEXAPRO) 5 MG tablet, Take 5 mg by mouth every morning., Disp: , Rfl:    famotidine (PEPCID) 10 MG tablet, Take 10 mg by mouth daily., Disp: , Rfl:    fexofenadine (ALLEGRA) 180 MG tablet, Take 180 mg by mouth at bedtime., Disp: , Rfl:    fluticasone (FLONASE) 50 MCG/ACT nasal spray, Place 2 sprays into both nostrils at bedtime. (Patient not taking: Reported on 02/08/2022), Disp: , Rfl:    fluticasone (FLONASE) 50 MCG/ACT nasal spray, Place 2 sprays into both nostrils daily., Disp: , Rfl:    fluticasone-salmeterol (ADVAIR) 250-50 MCG/ACT AEPB, Advair Diskus 250 mcg-50 mcg/dose powder for inhalation (Patient not taking: Reported on 02/08/2022), Disp: , Rfl:    Fluticasone-Salmeterol (ADVAIR) 250-50 MCG/DOSE AEPB, Inhale 1 puff into the lungs 2 (two) times daily., Disp: , Rfl:    glipiZIDE (GLUCOTROL) 10 MG tablet, Take 10 mg by mouth daily before breakfast., Disp: , Rfl:     hydrochlorothiazide (HYDRODIURIL) 25 MG tablet, Take 25 mg by mouth daily., Disp: , Rfl:    hydrochlorothiazide (HYDRODIURIL) 25 MG tablet, Take 1 tablet by mouth daily. (Patient not taking: Reported on 03/23/2021), Disp: , Rfl:    icosapent Ethyl (VASCEPA) 1 g capsule, Take 2 g by mouth 2 (two) times daily., Disp: , Rfl:    isosorbide mononitrate (IMDUR) 30 MG 24 hr tablet, Take 30 mg by mouth daily. (Patient not taking: Reported on 02/08/2022), Disp: , Rfl:    isosorbide mononitrate (IMDUR) 30 MG 24 hr tablet, Take 1 tablet by mouth daily., Disp: , Rfl:    losartan (COZAAR) 100 MG tablet, Take 1 tablet by mouth daily., Disp: , Rfl:    losartan (COZAAR) 50 MG tablet, Take 100 mg by mouth daily. (Patient not taking: Reported on 02/08/2022), Disp: , Rfl:    metFORMIN (GLUCOPHAGE) 1000 MG tablet, Take 1,000 mg by mouth 2 (two) times daily., Disp: , Rfl:    metoprolol succinate (TOPROL-XL) 100 MG 24 hr tablet, Take 50 mg by mouth daily. Take with or immediately following a meal. (Patient not taking: Reported on 03/23/2021), Disp: , Rfl:    metoprolol succinate (TOPROL-XL) 25 MG 24 hr tablet, Take 1 tablet by mouth daily., Disp: , Rfl:    Multiple Vitamins-Minerals (PRESERVISION AREDS 2) CAPS, Take 1 capsule by mouth at bedtime., Disp: , Rfl:    OXYGEN, Inhale 2.5-3 L into the lungs See admin instructions. Used while  sleeping and physical activity, Disp: , Rfl:    Polyethyl Glycol-Propyl Glycol 0.4-0.3 % SOLN, Place 1 drop into both eyes See admin instructions. Instill 1 drop into both eyes at night, may use during the day as needed for dry eyes, Disp: , Rfl:    rosuvastatin (CRESTOR) 10 MG tablet, Take 10 mg by mouth every other day., Disp: , Rfl:    tiotropium (SPIRIVA) 18 MCG inhalation capsule, Place 18 mcg into inhaler and inhale daily., Disp: , Rfl:    TRELEGY ELLIPTA 100-62.5-25 MCG/ACT AEPB, SMARTSIG:1 inhalation Via Inhaler Daily, Disp: , Rfl:   Past Medical History: Past Medical History:   Diagnosis Date   Acute MI ([place])    CHF (congestive heart failure) (HCC)    COPD (chronic obstructive pulmonary disease) ([place])    Coronary artery disease    Diabetes mellitus without complication (HCC)    GERD (gastroesophageal reflux disease)    History of hiatal hernia    Hypertension     Tobacco Use: Social History   Tobacco Use  Smoking Status Former   Packs/day: 1.00   Years: 35.00   Total pack years: 35.00   Types: Cigarettes   Quit date: 1995   Years since quitting: 28.6  Smokeless Tobacco Never    Labs: Review Flowsheet       Latest Ref Rng & Units 10/25/2016  Labs for ITP Cardiac and Pulmonary Rehab  Hemoglobin A1c 4.8 - 5.6 % 7.0      Pulmonary Assessment Scores:  Pulmonary Assessment Scores     Row Name 03/02/22 [phone] 06/16/22 1158       ADL UCSD   ADL Phase Entry Exit    SOB Score total 55 --    Rest 2 --    Walk 2 --    Stairs 4 --    Bath 2 --    Dress 1 --    Shop 3 --      CAT Score   CAT Score 18 --      mMRC Score   mMRC Score 3 1             UCSD: Self-administered rating of dyspnea associated with activities of daily living (ADLs) 6-point scale (0 = "not at all" to 5 = "maximal or unable to do because of breathlessness")  Scoring Scores range from 0 to 120.  Minimally important difference is 5 units  CAT: CAT can identify the health impairment of COPD patients and is better correlated with disease progression.  CAT has a scoring range of zero to 40. The CAT score is classified into four groups of low (less than 10), medium (10 - 20), high (21-30) and very high (31-40) based on the impact level of disease on health status. A CAT score over 10 suggests significant symptoms.  A worsening CAT score could be explained by an exacerbation, poor medication adherence, poor inhaler technique, or progression of COPD or comorbid conditions.  CAT MCID is 2 points  mMRC: mMRC (Modified Medical Research Council) Dyspnea Scale is used to  assess the degree of baseline functional disability in patients of respiratory disease due to dyspnea. No minimal important difference is established. A decrease in score of 1 point or greater is considered a positive change.   Pulmonary Function Assessment:  Pulmonary Function Assessment - 02/08/22 1015       Breath   Shortness of Breath Yes;Limiting activity             Exercise Target  Goals: Exercise Program Goal: Individual exercise prescription set using results from initial 6 min walk test and THRR while considering  patient's activity barriers and safety.   Exercise Prescription Goal: Initial exercise prescription builds to 30-45 minutes a day of aerobic activity, 2-3 days per week.  Home exercise guidelines will be given to patient during program as part of exercise prescription that the participant will acknowledge.  Education: Aerobic Exercise: - Group verbal and visual presentation on the components of exercise prescription. Introduces F.I.T.T principle from ACSM for exercise prescriptions.  Reviews F.I.T.T. principles of aerobic exercise including progression. Written material given at graduation. Flowsheet Row Pulmonary Rehab from 06/24/2021 in [facility] Cardiac and Pulmonary Rehab  Date 06/03/21  Educator [facility]  Instruction Review Code 1- Verbalizes Understanding       Education: Resistance Exercise: - Group verbal and visual presentation on the components of exercise prescription. Introduces F.I.T.T principle from ACSM for exercise prescriptions  Reviews F.I.T.T. principles of resistance exercise including progression. Written material given at graduation. Flowsheet Row Pulmonary Rehab from 06/24/2021 in [facility] Cardiac and Pulmonary Rehab  Date 04/08/21  Educator [facility]  Instruction Review Code 1- Verbalizes Understanding        Education: Exercise & Equipment Safety: - Individual verbal instruction and demonstration of equipment use and safety with use of the  equipment. Flowsheet Row Pulmonary Rehab from 06/09/2022 in [facility] Cardiac and Pulmonary Rehab  Date 02/08/22  Educator [facility]  Instruction Review Code 1- Verbalizes Understanding       Education: Exercise Physiology & General Exercise Guidelines: - Group verbal and written instruction with models to review the exercise physiology of the cardiovascular system and associated critical values. Provides general exercise guidelines with specific guidelines to those with heart or lung disease.  Flowsheet Row Pulmonary Rehab from 06/09/2022 in [facility] Cardiac and Pulmonary Rehab  Date 04/07/22  Educator [facility]  Instruction Review Code 1- [employer] Understanding       Education: Flexibility, Balance, Mind/Body Relaxation: - Group verbal and visual presentation with interactive activity on the components of exercise prescription. Introduces F.I.T.T principle from ACSM for exercise prescriptions. Reviews F.I.T.T. principles of flexibility and balance exercise training including progression. Also discusses the mind body connection.  Reviews various relaxation techniques to help reduce and manage stress (i.e. Deep breathing, progressive muscle relaxation, and visualization). Balance handout provided to take home. Written material given at graduation. Flowsheet Row Pulmonary Rehab from 06/24/2021 in [facility] Cardiac and Pulmonary Rehab  Date 06/17/21  Educator [facility]  Instruction Review Code 1- Verbalizes Understanding       Activity Barriers & Risk Stratification:  Activity Barriers & Cardiac Risk Stratification - 03/02/22 1355       Activity Barriers & Cardiac Risk Stratification   Activity Barriers Shortness of Breath;Muscular Weakness             6 Minute Walk:  6 Minute Walk     Row Name 03/02/22 1355 06/16/22 1156       6 Minute Walk   Phase Initial Discharge    Distance 1130 feet 1220 feet    Distance % Change -- 7.9 %    Distance Feet Change -- 90 ft    Walk Time 6 minutes 6 minutes    # of  Rest Breaks 0 0    MPH 2.14 2.31    METS 2.22 2.23    RPE 13 15    Perceived Dyspnea  3 3    VO2 Peak 7.77 7.82    Symptoms Yes (  comment) Yes (comment)    Comments SOB SOB    Resting HR 75 bpm 82 bpm    Resting BP 134/72 104/60    Resting Oxygen Saturation  92 % 90 %    Exercise Oxygen Saturation  during 6 min walk 83 % 83 %    Max Ex. HR 96 bpm 106 bpm    Max Ex. BP 150/72 136/72    2 Minute Post BP 128/70 112/64      Interval HR   1 Minute HR 87 98    2 Minute HR 93 102    3 Minute HR 94 105    4 Minute HR 95 105    5 Minute HR 96 106    6 Minute HR 94 106    2 Minute Post HR 70 92    Interval Heart Rate? Yes Yes      Interval Oxygen   Interval Oxygen? Yes Yes    Baseline Oxygen Saturation % 92 %  RA: 88% 90 %    1 Minute Oxygen Saturation % 89 % 86 %    1 Minute Liters of Oxygen 4 L  pulsed 4 L  pulsed    2 Minute Oxygen Saturation % 85 % 86 %    2 Minute Liters of Oxygen 4 L 4 L    3 Minute Oxygen Saturation % 85 % 85 %    3 Minute Liters of Oxygen 4 L 4 L    4 Minute Oxygen Saturation % 83 % 83 %    4 Minute Liters of Oxygen 4 L 4 L    5 Minute Oxygen Saturation % 85 % 84 %    5 Minute Liters of Oxygen 4 L 4 L    6 Minute Oxygen Saturation % 83 % 83 %    6 Minute Liters of Oxygen 4 L 4 L    2 Minute Post Oxygen Saturation % 94 % 90 %    2 Minute Post Liters of Oxygen 4 L 4 L            Oxygen Initial Assessment:  Oxygen Initial Assessment - 03/02/22 0950       Home Oxygen   Home Oxygen Device Portable Concentrator;E-Tanks;Home Concentrator    Sleep Oxygen Prescription Continuous    Liters per minute 3    Home Exercise Oxygen Prescription Continuous    Liters per minute 3    Home Resting Oxygen Prescription Continuous    Liters per minute 0   Uses PRN- encouraged to use all times above 88%   Compliance with Home Oxygen Use Yes      Initial 6 min Walk   Oxygen Used Pulsed    Liters per minute 4      Program Oxygen Prescription   Program Oxygen  Prescription Continuous    Liters per minute 2   Reaching out to MD for increase in O2 for prescription     Intervention   Short Term Goals To learn and demonstrate proper pursed lip breathing techniques or other breathing techniques. ;To learn and exhibit compliance with exercise, home and travel O2 prescription;To learn and understand importance of monitoring SPO2 with pulse oximeter and demonstrate accurate use of the pulse oximeter.;To learn and understand importance of maintaining oxygen saturations>88%;To learn and demonstrate proper use of respiratory medications    Long  Term Goals Exhibits compliance with exercise, home  and travel O2 prescription;Verbalizes importance of monitoring SPO2 with pulse oximeter and return  demonstration;Maintenance of O2 saturations>88%;Exhibits proper breathing techniques, such as pursed lip breathing or other method taught during program session;Compliance with respiratory medication;Demonstrates proper use of MDI's             Oxygen Re-Evaluation:  Oxygen Re-Evaluation     Row Name 03/12/22 0945 04/07/22 0933 05/03/22 0957 05/26/22 0951       Program Oxygen Prescription   Program Oxygen Prescription Continuous None Continuous;E-Tanks Continuous;E-Tanks    Liters per minute _0 Comments -- -- -- He may go up to 4L occasionally when experiencing SOB.      Home Oxygen   Home Oxygen Device Portable Concentrator;E-Tanks;Home Concentrator Portable Concentrator;E-Tanks;Home Concentrator Portable Concentrator;E-Tanks;Home Concentrator Portable Concentrator;E-Tanks;Home Concentrator    Sleep Oxygen Prescription Continuous Continuous Continuous Continuous    Liters per minute _1 Home Exercise Oxygen Prescription Continuous Continuous Continuous Continuous    Liters per minute _2 Home Resting Oxygen Prescription Continuous None None None    Liters per minute 0 0 -- 0    Compliance with Home Oxygen Use Yes Yes Yes Yes       Goals/Expected Outcomes   Short Term Goals To learn and demonstrate proper pursed lip breathing techniques or other breathing techniques.  To learn and understand importance of maintaining oxygen saturations>88%;To learn and understand importance of monitoring SPO2 with pulse oximeter and demonstrate accurate use of the pulse oximeter. To learn and understand importance of maintaining oxygen saturations>88%;To learn and understand importance of monitoring SPO2 with pulse oximeter and demonstrate accurate use of the pulse oximeter.;To learn and demonstrate proper pursed lip breathing techniques or other breathing techniques. ;To learn and exhibit compliance with exercise, home and travel O2 prescription;To learn and demonstrate proper use of respiratory medications To learn and understand importance of maintaining oxygen saturations>88%;To learn and understand importance of monitoring SPO2 with pulse oximeter and demonstrate accurate use of the pulse oximeter.;To learn and demonstrate proper pursed lip breathing techniques or other breathing techniques. ;To learn and exhibit compliance with exercise, home and travel O2 prescription;To learn and demonstrate proper use of respiratory medications    Long  Term Goals Exhibits proper breathing techniques, such as pursed lip breathing or other method taught during program session Exhibits proper breathing techniques, such as pursed lip breathing or other method taught during program session;Maintenance of O2 saturations>88% Exhibits compliance with exercise, home  and travel O2 prescription;Maintenance of O2 saturations>88%;Verbalizes importance of monitoring SPO2 with pulse oximeter and return demonstration;Exhibits proper breathing techniques, such as pursed lip breathing or other method taught during program session;Demonstrates proper use of MDI's;Compliance with respiratory medication Exhibits compliance with exercise, home  and travel O2 prescription;Maintenance of  O2 saturations>88%;Verbalizes importance of monitoring SPO2 with pulse oximeter and return demonstration;Exhibits proper breathing techniques, such as pursed lip breathing or other method taught during program session;Demonstrates proper use of MDI's;Compliance with respiratory medication    Comments Reviewed PLB technique with pt.  Talked about how it works and it's importance in maintaining their exercise saturations. Danvhas a pulse oximeter to check his oxygen saturation at home. Informed and explained why it is important to have one. Reviewed that oxygen saturations should be 88 percent and above. He states that he does not check it often and can feel when his oxygen is low. Jose Ray is doing well in rehab.  He left his oxygen in the car today, but is usually good about wearing it.  He  keeps it on at home.  He is using his pulse oximeter at home to keep close eye on saturations.  He uses his nebulizers twice a day and doing well with it. Jose Ray is doing well in rehab. He has been using his oxygen during rehab and uses his oxygen at home during activity. He states that he has been using inhalers routinely at home. He does not experience SOB with ADLs, but does experience some SOB with vigorous activity. He also states that he has been using his pulse oximeter at home to monitor his oxygen saturation.    Goals/Expected Outcomes Short: Become more profiecient at using PLB.   Long: Become independent at using PLB. Short: monitor oxygen at home with exertion. Long: maintain oxygen saturations above 88 percent independently. Short: Remember to get oxygen out of car Long: Continue to use PLB for breathing Short: Continue to monitor oxygen levels at home. Long: Continue to practice PLB for breathing             Oxygen Discharge (Final Oxygen Re-Evaluation):  Oxygen Re-Evaluation - 05/26/22 0951       Program Oxygen Prescription   Program Oxygen Prescription Continuous;E-Tanks    Liters per minute 3    Comments  He may go up to 4L occasionally when experiencing SOB.      Home Oxygen   Home Oxygen Device Portable Concentrator;E-Tanks;Home Concentrator    Sleep Oxygen Prescription Continuous    Liters per minute 3    Home Exercise Oxygen Prescription Continuous    Liters per minute 3    Home Resting Oxygen Prescription None    Liters per minute 0    Compliance with Home Oxygen Use Yes      Goals/Expected Outcomes   Short Term Goals To learn and understand importance of maintaining oxygen saturations>88%;To learn and understand importance of monitoring SPO2 with pulse oximeter and demonstrate accurate use of the pulse oximeter.;To learn and demonstrate proper pursed lip breathing techniques or other breathing techniques. ;To learn and exhibit compliance with exercise, home and travel O2 prescription;To learn and demonstrate proper use of respiratory medications    Long  Term Goals Exhibits compliance with exercise, home  and travel O2 prescription;Maintenance of O2 saturations>88%;Verbalizes importance of monitoring SPO2 with pulse oximeter and return demonstration;Exhibits proper breathing techniques, such as pursed lip breathing or other method taught during program session;Demonstrates proper use of MDI's;Compliance with respiratory medication    Comments Jose Ray is doing well in rehab. He has been using his oxygen during rehab and uses his oxygen at home during activity. He states that he has been using inhalers routinely at home. He does not experience SOB with ADLs, but does experience some SOB with vigorous activity. He also states that he has been using his pulse oximeter at home to monitor his oxygen saturation.    Goals/Expected Outcomes Short: Continue to monitor oxygen levels at home. Long: Continue to practice PLB for breathing             Initial Exercise Prescription:  Initial Exercise Prescription - 03/02/22 1300       Date of Initial Exercise RX and Referring Provider   Date 03/02/22     Referring Provider Wallene Huh MD      Oxygen   Oxygen Continuous    Liters 2   Sending request to doctor to increase O2 Rx   Maintain Oxygen Saturation 88% or higher      Treadmill   MPH 1.8  Grade 0    Minutes 15    METs 2.38      Recumbant Bike   Level 1    RPM 60    Watts 10    Minutes 15    METs 2.2      REL-XR   Level 1    Speed 50    Minutes 15    METs 2.2      T5 Nustep   Level 1    SPM 80    Minutes 15    METs 2.2      Prescription Details   Frequency (times per week) 3    Duration Progress to 30 minutes of continuous aerobic without signs/symptoms of physical distress      Intensity   THRR 40-80% of Max Heartrate 101 - 127    Ratings of Perceived Exertion 11-13    Perceived Dyspnea 0-4      Progression   Progression Continue to progress workloads to maintain intensity without signs/symptoms of physical distress.      Resistance Training   Training Prescription Yes    Weight 3 lb    Reps 10-15             Perform Capillary Blood Glucose checks as needed.  Exercise Prescription Changes:   Exercise Prescription Changes     Row Name 03/02/22 1300 03/17/22 0700 03/29/22 1500 04/12/22 0800 04/16/22 0900     Response to Exercise   Blood Pressure (Admit) 132/72 124/64 144/74 132/82 --   Blood Pressure (Exercise) 150/72 136/74 138/68 142/78 --   Blood Pressure (Exit) 128/70 102/60 112/66 122/60 --   Heart Rate (Admit) 75 bpm 86 bpm 83 bpm 92 bpm --   Heart Rate (Exercise) 96 bpm 90 bpm 101 bpm 102 bpm --   Heart Rate (Exit) 70 bpm 85 bpm 90 bpm 83 bpm --   Oxygen Saturation (Admit) 92 % 90 % 97 % 90 % --   Oxygen Saturation (Exercise) 83 % 88 % 88 % 88 % --   Oxygen Saturation (Exit) 94 % 91 % 92 % 93 % --   Rating of Perceived Exertion (Exercise) _0 --   Perceived Dyspnea (Exercise) 3 -- 4 3 --   Symptoms SOB -- SOB SOB --   Comments walk test results first day of exercise -- -- --   Duration -- Progress to 30 minutes of   aerobic without signs/symptoms of physical distress Continue with 30 min of aerobic exercise without signs/symptoms of physical distress. Continue with 30 min of aerobic exercise without signs/symptoms of physical distress. --   Intensity -- THRR unchanged THRR unchanged THRR unchanged --     Progression   Progression -- Continue to progress workloads to maintain intensity without signs/symptoms of physical distress. Continue to progress workloads to maintain intensity without signs/symptoms of physical distress. Continue to progress workloads to maintain intensity without signs/symptoms of physical distress. --   Average METs -- 2.23 2.56 2.44 --     Resistance Training   Training Prescription -- Yes Yes Yes --   Weight -- 3 lb 3 lb 3 lb --   Reps -- 10-15 10-15 10-15 --     Oxygen   Oxygen -- Continuous Continuous Continuous --   Liters -- _1 --     Treadmill   MPH -- 1._2 --   Grade -- 0 0 0 --   Minutes -- _3 --  METs -- 2.23 2.53 2.53 --     Recumbant Bike   Level -- -- 2 2 --   Watts -- -- 22 22 --   Minutes -- -- 15 15 --   METs -- -- 2.33 2.33 --     REL-XR   Level -- _0 --   Minutes -- _1 --   METs -- -- 2 3 --     T5 Nustep   Level -- -- 1 3 --   Minutes -- -- 15 15 --   METs -- -- 2.34 1.9 --     Home Exercise Plan   Plans to continue exercise at -- -- -- -- Home (comment)  walking, staff videos   Frequency -- -- -- -- Add 2 additional days to program exercise sessions.   Initial Home Exercises Provided -- -- -- -- 04/16/22     Oxygen   Maintain Oxygen Saturation -- 88% or higher 88% or higher 88% or higher --    Row Name 04/26/22 0800 05/11/22 0800 05/24/22 1000 06/07/22 1000       Response to Exercise   Blood Pressure (Admit) 122/62 126/64 118/62 112/72    Blood Pressure (Exercise) 126/70 -- -- --    Blood Pressure (Exit) 108/60 122/62 120/68 116/66    Heart Rate (Admit) 86 bpm 77 bpm 75 bpm 83 bpm    Heart Rate (Exercise) 92  bpm 93 bpm 92 bpm 105 bpm    Heart Rate (Exit) 89 bpm 81 bpm 83 bpm 80 bpm    Oxygen Saturation (Admit) 93 % 91 % 89 % 92 %    Oxygen Saturation (Exercise) 88 % 88 % 88 % 88 %    Oxygen Saturation (Exit) 94 % 93 % 91 % 94 %    Rating of Perceived Exertion (Exercise) _2 Perceived Dyspnea (Exercise) _3 Symptoms SOB SOB SOB SOB    Duration Continue with 30 min of aerobic exercise without signs/symptoms of physical distress. Continue with 30 min of aerobic exercise without signs/symptoms of physical distress. Continue with 30 min of aerobic exercise without signs/symptoms of physical distress. Continue with 30 min of aerobic exercise without signs/symptoms of physical distress.    Intensity THRR unchanged THRR unchanged THRR unchanged THRR unchanged      Progression   Progression Continue to progress workloads to maintain intensity without signs/symptoms of physical distress. Continue to progress workloads to maintain intensity without signs/symptoms of physical distress. Continue to progress workloads to maintain intensity without signs/symptoms of physical distress. Continue to progress workloads to maintain intensity without signs/symptoms of physical distress.    Average METs 2.4 1.89 2.42 2.37      Resistance Training   Training Prescription Yes Yes Yes Yes    Weight 3 lb 3 lb 3 lb 3 lb    Reps 10-15 10-15 10-15 10-15      Oxygen   Oxygen Continuous Continuous Continuous Continuous    Liters _4 Treadmill   MPH 1.8 1.8 1.7 1.7    Grade 0 0 0 0    Minutes _5 METs 2.38 2.38 2.3 2.3      Recumbant Bike   Level 2 -- 2 2    Watts 20 -- -- --    Minutes 15 -- 15 15    METs 2.34 -- 2.33  2.33      Arm Ergometer   Level -- 1 -- --    Minutes -- 15 -- --    METs -- 1 -- --      REL-XR   Level _0 Minutes _1 METs 3.2 -- 3.3 3.1      T5 Nustep   Level 2 -- 2 2    Minutes 15 -- 15 15    METs 1.9 -- 2 2      Home  Exercise Plan   Plans to continue exercise at Home (comment)  walking, staff videos Home (comment)  walking, staff videos Home (comment)  walking, staff videos Home (comment)  walking, staff videos    Frequency Add 2 additional days to program exercise sessions. Add 2 additional days to program exercise sessions. Add 2 additional days to program exercise sessions. Add 2 additional days to program exercise sessions.    Initial Home Exercises Provided 04/16/22 04/16/22 04/16/22 04/16/22      Oxygen   Maintain Oxygen Saturation 88% or higher 88% or higher 88% or higher 88% or higher             Exercise Comments:   Exercise Comments     Row Name 03/12/22 0943           Exercise Comments First full day of exercise!  Patient was oriented to gym and equipment including functions, settings, policies, and procedures.  Patient's individual exercise prescription and treatment plan were reviewed.  All starting workloads were established based on the results of the 6 minute walk test done at initial orientation visit.  The plan for exercise progression was also introduced and progression will be customized based on patient's performance and goals.                Exercise Goals and Review:   Exercise Goals     Row Name 03/02/22 1402             Exercise Goals   Increase Physical Activity Yes       Intervention Provide advice, education, support and counseling about physical activity/exercise needs.;Develop an individualized exercise prescription for aerobic and resistive training based on initial evaluation findings, risk stratification, comorbidities and participant's personal goals.       Expected Outcomes Short Term: Attend rehab on a regular basis to increase amount of physical activity.;Long Term: Add in home exercise to make exercise part of routine and to increase amount of physical activity.;Long Term: Exercising regularly at least 3-5 days a week.       Increase Strength and  Stamina Yes       Intervention Provide advice, education, support and counseling about physical activity/exercise needs.;Develop an individualized exercise prescription for aerobic and resistive training based on initial evaluation findings, risk stratification, comorbidities and participant's personal goals.       Expected Outcomes Short Term: Increase workloads from initial exercise prescription for resistance, speed, and METs.;Short Term: Perform resistance training exercises routinely during rehab and add in resistance training at home;Long Term: Improve cardiorespiratory fitness, muscular endurance and strength as measured by increased METs and functional capacity (6MWT)       Able to understand and use rate of perceived exertion (RPE) scale Yes       Intervention Provide education and explanation on how to use RPE scale       Expected Outcomes Short Term: Able to use RPE daily in  rehab to express subjective intensity level;Long Term:  Able to use RPE to guide intensity level when exercising independently       Able to understand and use Dyspnea scale Yes       Intervention Provide education and explanation on how to use Dyspnea scale       Expected Outcomes Short Term: Able to use Dyspnea scale daily in rehab to express subjective sense of shortness of breath during exertion;Long Term: Able to use Dyspnea scale to guide intensity level when exercising independently       Knowledge and understanding of Target Heart Rate Range (THRR) Yes       Intervention Provide education and explanation of THRR including how the numbers were predicted and where they are located for reference       Expected Outcomes Short Term: Able to state/look up THRR;Short Term: Able to use daily as guideline for intensity in rehab;Long Term: Able to use THRR to govern intensity when exercising independently       Able to check pulse independently Yes       Intervention Provide education and demonstration on how to check pulse  in carotid and radial arteries.;Review the importance of being able to check your own pulse for safety during independent exercise       Expected Outcomes Short Term: Able to explain why pulse checking is important during independent exercise;Long Term: Able to check pulse independently and accurately       Understanding of Exercise Prescription Yes       Intervention Provide education, explanation, and written materials on patient's individual exercise prescription       Expected Outcomes Short Term: Able to explain program exercise prescription;Long Term: Able to explain home exercise prescription to exercise independently                Exercise Goals Re-Evaluation :  Exercise Goals Re-Evaluation     Row Name 03/12/22 0943 03/17/22 0730 03/29/22 1517 04/12/22 0905 04/16/22 0937     Exercise Goal Re-Evaluation   Exercise Goals Review Able to understand and use rate of perceived exertion (RPE) scale;Able to understand and use Dyspnea scale;Knowledge and understanding of Target Heart Rate Range (THRR);Understanding of Exercise Prescription Understanding of Exercise Prescription;Increase Physical Activity;Increase Strength and Stamina Understanding of Exercise Prescription;Increase Physical Activity;Increase Strength and Stamina Understanding of Exercise Prescription;Increase Physical Activity;Increase Strength and Stamina Understanding of Exercise Prescription;Increase Physical Activity;Increase Strength and Stamina;Able to understand and use rate of perceived exertion (RPE) scale;Able to understand and use Dyspnea scale;Knowledge and understanding of Target Heart Rate Range (THRR);Able to check pulse independently   Comments Reviewed RPE and dyspnea scales, THR and program prescription with pt today.  Pt voiced understanding and was given a copy of goals to take home. Jayvan is doing well to start rehab. He completed his first day of exercise. He did well on the treadmill and XR. His doctor also  prescribed him 4 liters of oxygen during exercise. We will continue to monitor Calton's progress in the program. Jose Ray continues to do well in rehab. He has increased to level 2 on the recumbant bike and XR. He would benefit from adding on a small incline to the treadmill. Will continue to monitor workloads and oxygen saturations to stay above 88%. Jose Ray is doing well in rehab. He recently increased to level 3 on the T5 machine. He also improved to 3 METs on the XR machine. Jose Ray has tolerated using 3 lb hand weights for resistance training as  well. We will continue to monitor his progress in the program. Reviewed home exercise with pt today.  Pt plans to walk and use staff videos at home for exercise.  Reviewed THR, pulse, RPE, sign and symptoms, pulse oximetery and when to call 911 or MD.  Also discussed weather considerations and indoor options.  Pt voiced understanding.   Expected Outcomes Short: Use RPE daily to regulate intensity. Long: Follow program prescription in THR. Short: Continue to come to rehab. Long: Continue to increase strength and stamina. Short: Add incline on treadmill Long: Continue to increase overall MEt level Short: add incline on treadmill. Long: Continue to improve strength and stamina. Short: Add in more walking at home Long: Conitnue to exercise independently    [place] Name 04/26/22 0841 05/03/22 0949 05/11/22 0843 05/24/22 1034 05/26/22 0936     Exercise Goal Re-Evaluation   Exercise Goals Review Increase Physical Activity;Increase Strength and Stamina;Understanding of Exercise Prescription Increase Physical Activity;Increase Strength and Stamina;Understanding of Exercise Prescription Increase Physical Activity;Increase Strength and Stamina;Understanding of Exercise Prescription Increase Physical Activity;Increase Strength and Stamina;Understanding of Exercise Prescription Increase Physical Activity;Increase Strength and Stamina;Understanding of Exercise Prescription   Comments Knight is  doing well in rehab. He improved back to level 2 on the XR. He also improved to 2.34 METs using the recumbent bike. Mitsugi has also tolerated using 3 lb hand weights for resistance training. We will continue to monitor his progress in the program. Jose Ray is doing well in rehab.  He is walking some at home for 1 mile around the yard with the dog twice a day.  He is getting better with his stamina.  He is feeling better and planning to go to mountains next week. Jose Ray is doing well in rehab. He has done well at a speed of 1.8 mph on the treadmill. He would benefit from adding a little bit of an incline as well. He also began to use the arm ergometer and did well with it as well. We will continue to monitor his progress in the program. Jose Ray is doing well in rehab. He has been consistently at an overall average MET level above 2.4 METs. He also improved to level 3 on the XR. He did lower his speed slightly to 1.7 mph on the treadmill as well. We will continue to monitor his progress in the program. Jose Ray is doing well in rehab. He states that his walking has improved and his legs feel stronger. He also states that the leg pain he was experiencing has diminished since starting the program. He also states that exercise has improved his breathing as well. Jose Ray states that on his days away from rehab he has been walking as a way of exercise.   Expected Outcomes Short: increase speed on the treadmill. Long: Continue to improve strength and stamina. Short: Continue to walk on off days Long; continue to improve stamina Short: add incline on the treadmill. Long: Continue to improve strength and stamina. Short: Continue to increase workload on treadmill. Long: Continue to improve strength and stamina. Short: Continue to walk on days away from rehab. Long: Continue to improve stamina.    [place] Name 06/07/22 1038             Exercise Goal Re-Evaluation   Exercise Goals Review Increase Physical Activity;Increase Strength and  Stamina;Understanding of Exercise Prescription       Comments Jose Ray continues to do well in rehab. He has continued to keep his average overall MET level above 2.37 METs.  He also has tolerated the treadmill at a speed of 1.7 mph and no incline. He has continued to use 3 lb hand weights for resistance training as well. We will continue to monitor his progress in the program.       Expected Outcomes Short: Increase workloads. Long: Continue to improve strength and stamina.                Discharge Exercise Prescription (Final Exercise Prescription Changes):  Exercise Prescription Changes - 06/07/22 1000       Response to Exercise   Blood Pressure (Admit) 112/72    Blood Pressure (Exit) 116/66    Heart Rate (Admit) 83 bpm    Heart Rate (Exercise) 105 bpm    Heart Rate (Exit) 80 bpm    Oxygen Saturation (Admit) 92 %    Oxygen Saturation (Exercise) 88 %    Oxygen Saturation (Exit) 94 %    Rating of Perceived Exertion (Exercise) 15    Perceived Dyspnea (Exercise) 3    Symptoms SOB    Duration Continue with 30 min of aerobic exercise without signs/symptoms of physical distress.    Intensity THRR unchanged      Progression   Progression Continue to progress workloads to maintain intensity without signs/symptoms of physical distress.    Average METs 2.37      Resistance Training   Training Prescription Yes    Weight 3 lb    Reps 10-15      Oxygen   Oxygen Continuous    Liters 4      Treadmill   MPH 1.7    Grade 0    Minutes 15    METs 2.3      Recumbant Bike   Level 2    Minutes 15    METs 2.33      REL-XR   Level 3    Minutes 15    METs 3.1      T5 Nustep   Level 2    Minutes 15    METs 2      Home Exercise Plan   Plans to continue exercise at Home (comment)   walking, staff videos   Frequency Add 2 additional days to program exercise sessions.    Initial Home Exercises Provided 04/16/22      Oxygen   Maintain Oxygen Saturation 88% or higher              Nutrition:  Target Goals: Understanding of nutrition guidelines, daily intake of sodium <156m, cholesterol <2090m calories 30% from fat and 7% or less from saturated fats, daily to have 5 or more servings of fruits and vegetables.  Education: All About Nutrition: -Group instruction provided by verbal, written material, interactive activities, discussions, models, and posters to present general guidelines for heart healthy nutrition including fat, fiber, MyPlate, the role of sodium in heart healthy nutrition, utilization of the nutrition label, and utilization of this knowledge for meal planning. Follow up email sent as well. Written material given at graduation. [facility]  Date 04/22/21  [facility] 1- Verbalizes Understanding       Biometrics:  Pre Biometrics - 03/02/22 1355       Pre Biometrics   Height 6' (1.829 m)    Weight 203 lb 14.4 oz (92.5 kg)    BMI (Calculated) 27.65    Single Leg Stand 1.37 seconds  Post Biometrics - 06/16/22 1158        Post  Biometrics   Height 6' (1.829 m)    Weight 210 lb 8 oz (95.5 kg)    BMI (Calculated) 28.54             Nutrition Therapy Plan and Nutrition Goals:  Nutrition Therapy & Goals - 04/26/22 0848       Nutrition Therapy   Diet Heart healthy, low Na, diabetes friendly, pulmonary MNT    Drug/Food Interactions Statins/Certain Fruits    Protein (specify units) 110-115g    Fiber 30 grams    Whole Grain Foods 3 servings    Saturated Fats 16 max. grams    Fruits and Vegetables 8 servings/day    Sodium 2 grams      Personal Nutrition Goals   Nutrition Goal ST: switch up vegetables at dinner. add nuts with breakfast LT: increase overall variety of plant foods, continue with changes made    Comments 81 y.o. M admitted to pulmonary rehab for pulmonary emphysema. PMHx includes CAD, HTN, T2DM. Relevant  medications includes lipitor, vit D, vit B12, lexapro, pepcid, glipizide, hydrochlorothiazide, metformin, crestor, B complex vitamin, vit A/vit C/vitE/zinc/copper.  PYP Score: 60. Vegetables & Fruits 10/12. Breads, Grains & Cereals 7/12. Red & Processed Meat 8/12. Poultry 2/2. Fish & Shellfish 0/4. Beans, Nuts & Seeds 3/4. Milk & Dairy Foods 1/6. Toppings, Oils, Seasonings & Salt 13/20. Sweets, Snacks & Restaurant Food 7/14. Beverages 9/10.  He reports that since graduating rehab the last time around he has been trying to eat a well-balanced diet. S: boiled egg before breakfast B: cold cereal (cheerios and rice krispies, chex) with berries and milk (whole milk - he reports not enjoying lower fat milk). on the weekend he will have eggs. OJ with Pills. L: sandwich (ham and cheese on whole wheat bread) he dilutes his tea 1:4 (unsweeteded) D: meat (chicken, fish, red meat - lean meat) with vegetable (broccoli, mixed frozen vegetables, etc) with small dessert (nothing rich). He does the cooking for himself and his wife; generally he does not use fat with cooking - he uses some olive oil or canola oil or he uses broth to help with cooking, he does not salt his food during or after cooking. He was having difficulty during our conversation remebering some specific things about his diet. He reports not snacking most of the day - this is something he has worked on for a while. Drinks: unsweetened tea he has diluted and sometimes some lemonade in this mixture. He reports his BG "wanders around" - he reports not checking his BG regularly right now. His last A1C was 7.1 in March. Reviewed heart healthy eating, T2DM MNT, and pulmonary MNT. He would not like to make any changes at this time; encouraged to eat good source of protein at each meal and to increase variety of plant foods.      Intervention Plan   Intervention Prescribe, educate and counsel regarding individualized specific dietary modifications aiming towards  targeted core components such as weight, hypertension, lipid management, diabetes, heart failure and other comorbidities.    Expected Outcomes Short Term Goal: Understand basic principles of dietary content, such as calories, fat, sodium, cholesterol and nutrients.;Short Term Goal: A plan has been developed with personal nutrition goals set during dietitian appointment.;Long Term Goal: Adherence to prescribed nutrition plan.             Nutrition Assessments:  MEDIFICTS Score Key: ?70 Need to make dietary  changes  40-70 Heart Healthy Diet ? 40 Therapeutic Level Cholesterol Diet  Flowsheet Row Pulmonary Rehab from 03/02/2022 in [facility]  Picture Your Plate Total Score on Admission 60      Picture Your Plate Scores: <09 Unhealthy dietary pattern with much room for improvement. 41-50 Dietary pattern unlikely to meet recommendations for good health and room for improvement. 51-60 More healthful dietary pattern, with some room for improvement.  >60 Healthy dietary pattern, although there may be some specific behaviors that could be improved.   Nutrition Goals Re-Evaluation:  Nutrition Goals Re-Evaluation     [place] Name 04/07/22 0936 05/03/22 0953 05/26/22 0940         Goals   Current Weight 207 lb (93.9 kg) -- --     Nutrition Goal lose some weight ST: switch up vegetables at dinner. add nuts with breakfast LT: increase overall variety of plant foods, continue with changes made ST: Continue to add and switch up vegetables at dinner LT: Continue with dietary changes discussed with RD     Comment Patient was informed on why it is important to maintain a balanced diet when dealing with Respiratory issues. Explained that it takes a lot of energy to breath and when they are short of breath often they will need to have a good diet to help keep up with the calories they are expending for breathing. Dan just met with dietitian and has started to adapt changes by adding in  more vegetables at dinner. He has not added the nuts at breakfast consistently yet, but he is using them as a snack in the mornings. Jose Ray states that he has still been working on incorporating some of the dietary changes he discussed with the RD. Specifically, he states that he has been adding more vegetables at dinners. He has also incorporated nuts as a part of his breakfast.     Expected Outcome Short: Choose and plan snacks accordingly to patients caloric intake to improve breathing. Long: Maintain a diet independently that meets their caloric intake to aid in daily shortness of breath. Short; continue to adapt changes Long: conitnue to eat a good vareity. Short; continue to adapt changes Long: conitnue to eat a good vareity.              Nutrition Goals Discharge (Final Nutrition Goals Re-Evaluation):  Nutrition Goals Re-Evaluation - 05/26/22 0940       Goals   Nutrition Goal ST: Continue to add and switch up vegetables at dinner LT: Continue with dietary changes discussed with RD    Comment Jose Ray states that he has still been working on incorporating some of the dietary changes he discussed with the RD. Specifically, he states that he has been adding more vegetables at dinners. He has also incorporated nuts as a part of his breakfast.    Expected Outcome Short; continue to adapt changes Long: conitnue to eat a good vareity.             Psychosocial: Target Goals: Acknowledge presence or absence of significant depression and/or stress, maximize coping skills, provide positive support system. Participant is able to verbalize types and ability to use techniques and skills needed for reducing stress and depression.   Education: Stress, Anxiety, and Depression - Group verbal and visual presentation to define topics covered.  Reviews how body is impacted by stress, anxiety, and depression.  Also discusses healthy ways to reduce stress and to treat/manage anxiety and depression.  Written  material  given at graduation. Flowsheet Row Pulmonary Rehab from 06/09/2022 in [facility]  Date 06/02/22  Educator [facility]  Instruction Review Code 1- [employer] Understanding       Education: Sleep Hygiene -Provides group verbal and written instruction about how sleep can affect your health.  Define sleep hygiene, discuss sleep cycles and impact of sleep habits. Review good sleep hygiene tips.    Initial Review & Psychosocial Screening:  Initial Psych Review & Screening - 02/08/22 1017       Initial Review   Current issues with Current Psychotropic Meds      Family Dynamics   Good Support System? Yes    Comments Kaycen has a great support system. His wife, daughter that lives down the street and his other sons call him and visit. He has a positive outlook on his health. He has done the program a few times and is looking to get into shape for his breathing.      Barriers   Psychosocial barriers to participate in program The patient should benefit from training in stress management and relaxation.      Screening Interventions   Interventions To provide support and resources with identified psychosocial needs;Provide feedback about the scores to participant;Encouraged to exercise    Expected Outcomes Short Term goal: Utilizing psychosocial counselor, staff and physician to assist with identification of specific Stressors or current issues interfering with healing process. Setting desired goal for each stressor or current issue identified.;Long Term Goal: Stressors or current issues are controlled or eliminated.;Short Term goal: Identification and review with participant of any Quality of Life or Depression concerns found by scoring the questionnaire.;Long Term goal: The participant improves quality of Life and PHQ9 Scores as seen by post scores and/or verbalization of changes             Quality of Life Scores:  Scores of 19 and below usually indicate a poorer  quality of life in these areas.  A difference of  2-3 points is a clinically meaningful difference.  A difference of 2-3 points in the total score of the Quality of Life Index has been associated with significant improvement in overall quality of life, self-image, physical symptoms, and general health in studies assessing change in quality of life.  PHQ-9: Review Flowsheet       03/02/2022 06/17/2021 04/29/2021 03/30/2021  Depression screen PHQ 2/9  Decreased Interest 0 1 0 0  Down, Depressed, Hopeless 0 0 0 0  PHQ - 2 Score 0 1 0 0  Altered sleeping 0 0 1 2  Tired, decreased energy 1 2 0 3  Change in appetite 1 0 0 1  Feeling bad or failure about yourself  0 0 0 0  Trouble concentrating 0 _0 Moving slowly or fidgety/restless 0 0 0 2  Suicidal thoughts 0 0 0 0  PHQ-9 Score _1 Difficult doing work/chores Not difficult at all Not difficult at all Not difficult at all Somewhat difficult   Interpretation of Total Score  Total Score Depression Severity:  1-4 = Minimal depression, 5-9 = Mild depression, 10-14 = Moderate depression, 15-19 = Moderately severe depression, 20-27 = Severe depression   Psychosocial Evaluation and Intervention:  Psychosocial Evaluation - 02/08/22 1019       Psychosocial Evaluation & Interventions   Interventions Encouraged to exercise with the program and follow exercise prescription;Stress management education;Relaxation education    Comments Aceton has a great support system. His wife,  daughter that lives down the street and his other sons call him and visit. He has a positive outlook on his health. He has done the program a few times and is looking to get into shape for his breathing.    Expected Outcomes Short: Start [facility] to help with mood. Long: Maintain a healthy mental state    Continue Psychosocial Services  Follow up required by staff             Psychosocial Re-Evaluation:  Psychosocial Re-Evaluation     [place] Name 04/07/22 [phone]  05/03/22 0952 05/26/22 0943         Psychosocial Re-Evaluation   Current issues with None Identified None Identified;Current Psychotropic Meds None Identified     Comments Patient reports no issues with their current mental states, sleep, stress, depression or anxiety. Will follow up with patient in a few weeks for any changes. Jose Ray is going on vacation to the mountains.  He is looking forward to his trip.  He is feleing good mentally and balanced with his mood.  He tries to stay positive for the most part.  He is sleeping well even with being up around 6am with dog. Jose Ray denies any major stressors at this time. He also states that he is doing very well with his sleep. Jose Ray enjoys spending time with his dog and cooking for stress relief. He also states that exercise is a good stress reliever as well. Jose Ray states that he has a good support system made up by his wife and three children.     Expected Outcomes Short: Continue to exercise regularly to support mental health and notify staff of any changes. Long: maintain mental health and well being through teaching of rehab or prescribed medications independently. Short: Enjoy trip to mountains Long: continue to stay positive Short: Continue to use attend rehab for stress relief. Long: Continue to maintain positive outlook.     Interventions Encouraged to attend Pulmonary Rehabilitation for the exercise Encouraged to attend Pulmonary Rehabilitation for the exercise Encouraged to attend Pulmonary Rehabilitation for the exercise     Continue Psychosocial Services  Follow up required by staff Follow up required by staff Follow up required by staff              Psychosocial Discharge (Final Psychosocial Re-Evaluation):  Psychosocial Re-Evaluation - 05/26/22 0943       Psychosocial Re-Evaluation   Current issues with None Identified    Comments Jose Ray denies any major stressors at this time. He also states that he is doing very well with his sleep. Jose Ray enjoys  spending time with his dog and cooking for stress relief. He also states that exercise is a good stress reliever as well. Jose Ray states that he has a good support system made up by his wife and three children.    Expected Outcomes Short: Continue to use attend rehab for stress relief. Long: Continue to maintain positive outlook.    Interventions Encouraged to attend Pulmonary Rehabilitation for the exercise    Continue Psychosocial Services  Follow up required by staff             Education: Education Goals: Education classes will be provided on a weekly basis, covering required topics. Participant will state understanding/return demonstration of topics presented.  Learning Barriers/Preferences:  Learning Barriers/Preferences - 02/08/22 1017       Learning Barriers/Preferences   Learning Barriers None    Learning Preferences None  General Pulmonary Education Topics:  Infection Prevention: - Provides verbal and written material to individual with discussion of infection control including proper hand washing and proper equipment cleaning during exercise session. Flowsheet Row Pulmonary Rehab from 06/09/2022 in [facility] Cardiac and Pulmonary Rehab  Date 02/08/22  Educator [facility]  Instruction Review Code 1- Verbalizes Understanding       Falls Prevention: - Provides verbal and written material to individual with discussion of falls prevention and safety. Flowsheet Row Pulmonary Rehab from 06/09/2022 in [facility] Cardiac and Pulmonary Rehab  Date 02/08/22  Educator [facility]  Instruction Review Code 1- Verbalizes Understanding       Chronic Lung Disease Review: - Group verbal instruction with posters, models, PowerPoint presentations and videos,  to review new updates, new respiratory medications, new advancements in procedures and treatments. Providing information on websites and "800" numbers for continued self-education. Includes information about supplement oxygen, available  portable oxygen systems, continuous and intermittent flow rates, oxygen safety, concentrators, and Medicare reimbursement for oxygen. Explanation of Pulmonary Drugs, including class, frequency, complications, importance of spacers, rinsing mouth after steroid MDI's, and proper cleaning methods for nebulizers. Review of basic lung anatomy and physiology related to function, structure, and complications of lung disease. Review of risk factors. Discussion about methods for diagnosing sleep apnea and types of masks and machines for OSA. Includes a review of the use of types of environmental controls: home humidity, furnaces, filters, dust mite/pet prevention, HEPA vacuums. Discussion about weather changes, air quality and the benefits of nasal washing. Instruction on Warning signs, infection symptoms, calling MD promptly, preventive modes, and value of vaccinations. Review of effective airway clearance, coughing and/or vibration techniques. Emphasizing that all should Create an Action Plan. Written material given at graduation. Flowsheet Row Pulmonary Rehab from 06/09/2022 in [facility] Cardiac and Pulmonary Rehab  Education need identified 03/02/22  Date 03/24/22  Educator [facility]  Instruction Review Code 1- Verbalizes Understanding       AED/CPR: - Group verbal and written instruction with the use of models to demonstrate the basic use of the AED with the basic ABC's of resuscitation.    Anatomy and Cardiac Procedures: - Group verbal and visual presentation and models provide information about basic cardiac anatomy and function. Reviews the testing methods done to diagnose heart disease and the outcomes of the test results. Describes the treatment choices: Medical Management, Angioplasty, or Coronary Bypass Surgery for treating various heart conditions including Myocardial Infarction, Angina, Valve Disease, and Cardiac Arrhythmias.  Written material given at graduation. Flowsheet Row Pulmonary Rehab from  06/09/2022 in [facility] Cardiac and Pulmonary Rehab  Date 04/21/22  Educator SB  Instruction Review Code 1- Verbalizes Understanding       Medication Safety: - Group verbal and visual instruction to review commonly prescribed medications for heart and lung disease. Reviews the medication, class of the drug, and side effects. Includes the steps to properly store meds and maintain the prescription regimen.  Written material given at graduation. Flowsheet Row Pulmonary Rehab from 06/09/2022 in [facility] Cardiac and Pulmonary Rehab  Date 05/12/22  Educator SB  Instruction Review Code 1- Verbalizes Understanding       Other: -Provides group and verbal instruction on various topics (see comments)   Knowledge Questionnaire Score:  Knowledge Questionnaire Score - 03/02/22 1404       Knowledge Questionnaire Score   Pre Score 15/18              Core Components/Risk Factors/Patient Goals at Admission:  Personal Goals and Risk  Factors at Admission - 03/02/22 1402       Core Components/Risk Factors/Patient Goals on Admission    Weight Management Yes;Weight Loss    Intervention Weight Management: Develop a combined nutrition and exercise program designed to reach desired caloric intake, while maintaining appropriate intake of nutrient and fiber, sodium and fats, and appropriate energy expenditure required for the weight goal.;Weight Management: Provide education and appropriate resources to help participant work on and attain dietary goals.;Weight Management/Obesity: Establish reasonable short term and long term weight goals.    Admit Weight 203 lb (92.1 kg)    Goal Weight: Short Term 198 lb (89.8 kg)    Goal Weight: Long Term 193 lb (87.5 kg)    Expected Outcomes Short Term: Continue to assess and modify interventions until short term weight is achieved;Long Term: Adherence to nutrition and physical activity/exercise program aimed toward attainment of established weight goal;Weight Loss:  Understanding of general recommendations for a balanced deficit meal plan, which promotes 1-2 lb weight loss per week and includes a negative energy balance of [phone] kcal/d;Understanding recommendations for meals to include 15-35% energy as protein, 25-35% energy from fat, 35-60% energy from carbohydrates, less than 227m of dietary cholesterol, 20-35 gm of total fiber daily;Understanding of distribution of calorie intake throughout the day with the consumption of 4-5 meals/snacks    Improve shortness of breath with ADL's Yes    Intervention Provide education, individualized exercise plan and daily activity instruction to help decrease symptoms of SOB with activities of daily living.    Expected Outcomes Short Term: Improve cardiorespiratory fitness to achieve a reduction of symptoms when performing ADLs;Long Term: Be able to perform more ADLs without symptoms or delay the onset of symptoms    Diabetes Yes    Intervention Provide education about signs/symptoms and action to take for hypo/hyperglycemia.;Provide education about proper nutrition, including hydration, and aerobic/resistive exercise prescription along with prescribed medications to achieve blood glucose in normal ranges: Fasting glucose 65-99 mg/dL    Expected Outcomes Short Term: Participant verbalizes understanding of the signs/symptoms and immediate care of hyper/hypoglycemia, proper foot care and importance of medication, aerobic/resistive exercise and nutrition plan for blood glucose control.;Long Term: Attainment of HbA1C < 7%.    Heart Failure Yes    Intervention Provide a combined exercise and nutrition program that is supplemented with education, support and counseling about heart failure. Directed toward relieving symptoms such as shortness of breath, decreased exercise tolerance, and extremity edema.    Expected Outcomes Improve functional capacity of life;Short term: Attendance in program 2-3 days a week with increased exercise  capacity. Reported lower sodium intake. Reported increased fruit and vegetable intake. Reports medication compliance.;Short term: Daily weights obtained and reported for increase. Utilizing diuretic protocols set by physician.;Long term: Adoption of self-care skills and reduction of barriers for early signs and symptoms recognition and intervention leading to self-care maintenance.    Hypertension Yes    Intervention Provide education on lifestyle modifcations including regular physical activity/exercise, weight management, moderate sodium restriction and increased consumption of fresh fruit, vegetables, and low fat dairy, alcohol moderation, and smoking cessation.;Monitor prescription use compliance.    Expected Outcomes Short Term: Continued assessment and intervention until BP is < 140/948mHG in hypertensive participants. < 130/8081mG in hypertensive participants with diabetes, heart failure or chronic kidney disease.;Long Term: Maintenance of blood pressure at goal levels.    Lipids Yes    Intervention Provide education and support for participant on nutrition & aerobic/resistive exercise along with prescribed medications  to achieve LDL <57m, HDL >473m    Expected Outcomes Short Term: Participant states understanding of desired cholesterol values and is compliant with medications prescribed. Participant is following exercise prescription and nutrition guidelines.;Long Term: Cholesterol controlled with medications as prescribed, with individualized exercise RX and with personalized nutrition plan. Value goals: LDL < 70107mHDL > 40 mg.             Education:Diabetes - Individual verbal and written instruction to review signs/symptoms of diabetes, desired ranges of glucose level fasting, after meals and with exercise. Acknowledge that pre and post exercise glucose checks will be done for 3 sessions at entry of program. Flowsheet [facility]  Date 02/08/22  [facility] Code 1- Verbalizes Understanding       Know Your Numbers and Heart Failure: - Group verbal and visual instruction to discuss disease risk factors for cardiac and pulmonary disease and treatment options.  Reviews associated critical values for Overweight/Obesity, Hypertension, Cholesterol, and Diabetes.  Discusses basics of heart failure: signs/symptoms and treatments.  Introduces Heart Failure Zone chart for action plan for heart failure.  Written material given at graduation. Flowsheet [facility]  Date 03/17/22  Educator SB  Instruction Review Code 1- Verbalizes Understanding       Core Components/Risk Factors/Patient Goals Review:   Goals and Risk Factor Review     Row Name 04/07/22 [phone]/24/23 0955 05/26/22 0947         Core Components/Risk Factors/Patient Goals Review   Personal Goals Review Improve shortness of breath with ADL's Improve shortness of breath with ADL's;Weight Management/Obesity;Increase knowledge of respiratory medications and ability to use respiratory devices properly.;Hypertension;Diabetes Improve shortness of breath with ADL's;Weight Management/Obesity;Hypertension;Diabetes     Review Spoke to patient about their shortness of breath and what they can do to improve. Patient has been informed of breathing techniques when starting the program. Patient is informed to tell staff if they have had any med changes and that certain meds they are taking or not taking can be causing shortness of breath. DanLinna Ray doing well in rehab.  His weight has been holding steady recently.  HIs sugars are doing well and he checks them on occasion at home.  His pressures are doing well and he is keeping a close eye on them.  He is doing better with his SOB, but does tend to forget to bring his oxygen into class with him.  He is doing well on his meds.  He is using his inhalers  and nebulizers routinely DanLinna Hoffates that he is comfortable with where his weight is at this time. He states that he has not been checking his blood sugars at home but does have a way to check his glucose levels. He was encouraged to do so. DanLinna Hoffates that he is not SOB at home with ADLs, but experiences some with vigorous activity. He also states that he is using his inhalers routinely and uses oxygen when doing activity.     Expected Outcomes Short: Attend [facility] regularly to improve shortness of breath with ADL's. Long: maintain independence with ADL's Short: Conitnue to maintain weight Long: Conitnue to montior risk factors. Short: begin to monitor blood sugars at home. Long: Continue to monitor risk factors.              Core Components/Risk Factors/Patient Goals at Discharge (Final Review):   Goals  and Risk Factor Review - 05/26/22 0947       Core Components/Risk Factors/Patient Goals Review   Personal Goals Review Improve shortness of breath with ADL's;Weight Management/Obesity;Hypertension;Diabetes    Review Jose Ray states that he is comfortable with where his weight is at this time. He states that he has not been checking his blood sugars at home but does have a way to check his glucose levels. He was encouraged to do so. Jose Ray states that he is not SOB at home with ADLs, but experiences some with vigorous activity. He also states that he is using his inhalers routinely and uses oxygen when doing activity.    Expected Outcomes Short: begin to monitor blood sugars at home. Long: Continue to monitor risk factors.             ITP Comments:  ITP Comments     Row Name 02/08/22 1026 03/02/22 0947 03/12/22 0943 03/24/22 1236 04/21/22 0812   ITP Comments In patient  Visit completed. Patient informed on EP and RD appointment and 6 Minute walk test. Patient also informed of patient health questionnaires on My Chart. Patient Verbalizes understanding. Visit diagnosis can be found in [facility]  12/08/2021. Completed 6MWT and gym orientation. Initial ITP created and sent for review to Dr. Ottie Glazier,  Medical Director. First full day of exercise!  Patient was oriented to gym and equipment including functions, settings, policies, and procedures.  Patient's individual exercise prescription and treatment plan were reviewed.  All starting workloads were established based on the results of the 6 minute walk test done at initial orientation visit.  The plan for exercise progression was also introduced and progression will be customized based on patient's performance and goals. 30 Day review completed. Medical Director ITP review done, changes made as directed, and signed approval by Medical Director. 30 Day review completed. Medical Director ITP review done, changes made as directed, and signed approval by Medical Director.    [place] Name 04/26/22 1258 05/19/22 0717 06/16/22 1318       ITP Comments Completed initial RD consultation 30 Day review completed. Medical Director ITP review done, changes made as directed, and signed approval by Medical Director. 30 Day review completed. Medical Director ITP review done, changes made as directed, and signed approval by Medical Director.              Comments:

## 2022-06-16 NOTE — Progress Notes (Signed)
Daily Session Note  Patient Details  Name: Jose Ray MRN: [id number] Date of Birth: June 29, 1941 Referring Provider:   Flowsheet Row Pulmonary Rehab from 03/02/2022 in [facility] Cardiac and Pulmonary Rehab  Referring Provider Wallene Huh MD       Encounter Date: 06/16/2022  Check In:  Session Check In - 06/16/22 1012       Check-In   Supervising physician immediately available to respond to emergencies See telemetry face sheet for immediately available ER MD    Location [facility]-Cardiac & Pulmonary Rehab    Staff Present Heath Lark, RN, BSN, CCRP;Joseph Hood, RCP,RRT,BSRT;Noah Tickle, [place], Exercise Physiologist    Virtual Visit No    Medication changes reported     No    Fall or balance concerns reported    No    Warm-up and Cool-down Performed on first and last piece of equipment    Resistance Training Performed Yes    VAD Patient? No    PAD/SET Patient? No      Pain Assessment   Currently in Pain? No/denies                Social History   Tobacco Use  Smoking Status Former   Packs/day: 1.00   Years: 35.00   Total pack years: 35.00   Types: Cigarettes   Quit date: 1995   Years since quitting: 28.6  Smokeless Tobacco Never    Goals Met:  Proper associated with RPD/PD & O2 Sat Independence with exercise equipment Exercise tolerated well No report of concerns or symptoms today  Goals Unmet:  Not Applicable  Comments: Pt able to follow exercise prescription today without complaint.  Will continue to monitor for progression.    Dr. Emily Filbert is Medical Director for [place] [facility].  Dr. Ottie Glazier is Medical Director for [facility] Pulmonary Rehabilitation.

## 2022-06-16 NOTE — Patient Instructions (Signed)
Discharge Patient Instructions  Patient Details  Name: Jose Ray MRN: [id number] Date of Birth: [birth year] Referring Provider:  Dion Body, MD   Number of Visits: 18  Reason for Discharge:  Patient reached a stable level of exercise. Patient independent in their exercise. Patient has met program and personal goals.  Smoking History:  Social History   Tobacco Use  Smoking Status Former   Packs/day: 1.00   Years: 35.00   Total pack years: 35.00   Types: Cigarettes   Quit date: 1995   Years since quitting: 28.6  Smokeless Tobacco Never    Diagnosis:  Pulmonary emphysema, unspecified emphysema type ([place])  Initial Exercise Prescription:  Initial Exercise Prescription - 03/02/22 1300       Date of Initial Exercise RX and Referring Provider   Date 03/02/22    Referring Provider Wallene Huh MD      Oxygen   Oxygen Continuous    Liters 2   Sending request to doctor to increase O2 Rx   Maintain Oxygen Saturation 88% or higher      Treadmill   MPH 1.8    Grade 0    Minutes 15    METs 2.38      Recumbant Bike   Level 1    RPM 60    Watts 10    Minutes 15    METs 2.2      REL-XR   Level 1    Speed 50    Minutes 15    METs 2.2      T5 Nustep   Level 1    SPM 80    Minutes 15    METs 2.2      Prescription Details   Frequency (times per week) 3    Duration Progress to 30 minutes of continuous aerobic without signs/symptoms of physical distress      Intensity   THRR 40-80% of Max Heartrate 101 - 127    Ratings of Perceived Exertion 11-13    Perceived Dyspnea 0-4      Progression   Progression Continue to progress workloads to maintain intensity without signs/symptoms of physical distress.      Resistance Training   Training Prescription Yes    Weight 3 lb    Reps 10-15             Discharge Exercise Prescription (Final Exercise Prescription Changes):  Exercise Prescription Changes - 06/07/22 1000       Response to  Exercise   Blood Pressure (Admit) 112/72    Blood Pressure (Exit) 116/66    Heart Rate (Admit) 83 bpm    Heart Rate (Exercise) 105 bpm    Heart Rate (Exit) 80 bpm    Oxygen Saturation (Admit) 92 %    Oxygen Saturation (Exercise) 88 %    Oxygen Saturation (Exit) 94 %    Rating of Perceived Exertion (Exercise) 15    Perceived Dyspnea (Exercise) 3    Symptoms SOB    Duration Continue with 30 min of aerobic exercise without signs/symptoms of physical distress.    Intensity THRR unchanged      Progression   Progression Continue to progress workloads to maintain intensity without signs/symptoms of physical distress.    Average METs 2.37      Resistance Training   Training Prescription Yes    Weight 3 lb    Reps 10-15      Oxygen   Oxygen Continuous    Liters 4  Treadmill   MPH 1.7    Grade 0    Minutes 15    METs 2.3      Recumbant Bike   Level 2    Minutes 15    METs 2.33      REL-XR   Level 3    Minutes 15    METs 3.1      T5 Nustep   Level 2    Minutes 15    METs 2      Home Exercise Plan   Plans to continue exercise at Home (comment)   walking, staff videos   Frequency Add 2 additional days to program exercise sessions.    Initial Home Exercises Provided 04/16/22      Oxygen   Maintain Oxygen Saturation 88% or higher             Functional Capacity:  6 Minute Walk     Row Name 03/02/22 1355 06/16/22 1156       6 Minute Walk   Phase Initial Discharge    Distance 1130 feet 1220 feet    Distance % Change -- 7.9 %    Distance Feet Change -- 90 ft    Walk Time 6 minutes 6 minutes    # of Rest Breaks 0 0    MPH 2.14 2.31    METS 2.22 2.23    RPE 13 15    Perceived Dyspnea  3 3    VO2 Peak 7.77 7.82    Symptoms Yes (comment) Yes (comment)    Comments SOB SOB    Resting HR 75 bpm 82 bpm    Resting BP 134/72 104/60    Resting Oxygen Saturation  92 % 90 %    Exercise Oxygen Saturation  during 6 min walk 83 % 83 %    Max Ex. HR 96 bpm 106  bpm    Max Ex. BP 150/72 136/72    2 Minute Post BP 128/70 112/64      Interval HR   1 Minute HR 87 98    2 Minute HR 93 102    3 Minute HR 94 105    4 Minute HR 95 105    5 Minute HR 96 106    6 Minute HR 94 106    2 Minute Post HR 70 92    Interval Heart Rate? Yes Yes      Interval Oxygen   Interval Oxygen? Yes Yes    Baseline Oxygen Saturation % 92 %  RA: 88% 90 %    1 Minute Oxygen Saturation % 89 % 86 %    1 Minute Liters of Oxygen 4 L  pulsed 4 L  pulsed    2 Minute Oxygen Saturation % 85 % 86 %    2 Minute Liters of Oxygen 4 L 4 L    3 Minute Oxygen Saturation % 85 % 85 %    3 Minute Liters of Oxygen 4 L 4 L    4 Minute Oxygen Saturation % 83 % 83 %    4 Minute Liters of Oxygen 4 L 4 L    5 Minute Oxygen Saturation % 85 % 84 %    5 Minute Liters of Oxygen 4 L 4 L    6 Minute Oxygen Saturation % 83 % 83 %    6 Minute Liters of Oxygen 4 L 4 L    2 Minute Post Oxygen Saturation % 94 % 90 %  2 Minute Post Liters of Oxygen 4 L 4 L            Nutrition & Weight - Outcomes:  Pre Biometrics - 03/02/22 1355       Pre Biometrics   Height 6' (1.829 m)    Weight 203 lb 14.4 oz (92.5 kg)    BMI (Calculated) 27.65    Single Leg Stand 1.37 seconds             Post Biometrics - 06/16/22 1158        Post  Biometrics   Height 6' (1.829 m)    Weight 210 lb 8 oz (95.5 kg)    BMI (Calculated) 28.54             Nutrition:  Nutrition Therapy & Goals - 04/26/22 0848       Nutrition Therapy   Diet Heart healthy, low Na, diabetes friendly, pulmonary MNT    Drug/Food Interactions Statins/Certain Fruits    Protein (specify units) 110-115g    Fiber 30 grams    Whole Grain Foods 3 servings    Saturated Fats 16 max. grams    Fruits and Vegetables 8 servings/day    Sodium 2 grams      Personal Nutrition Goals   Nutrition Goal ST: switch up vegetables at dinner. add nuts with breakfast LT: increase overall variety of plant foods, continue with changes made     Comments 81 y.o. M admitted to pulmonary rehab for pulmonary emphysema. PMHx includes CAD, HTN, T2DM. Relevant medications includes lipitor, vit D, vit B12, lexapro, pepcid, glipizide, hydrochlorothiazide, metformin, crestor, B complex vitamin, vit A/vit C/vitE/zinc/copper.  PYP Score: 60. Vegetables & Fruits 10/12. Breads, Grains & Cereals 7/12. Red & Processed Meat 8/12. Poultry 2/2. Fish & Shellfish 0/4. Beans, Nuts & Seeds 3/4. Milk & Dairy Foods 1/6. Toppings, Oils, Seasonings & Salt 13/20. Sweets, Snacks & Restaurant Food 7/14. Beverages 9/10.  He reports that since graduating rehab the last time around he has been trying to eat a well-balanced diet. S: boiled egg before breakfast B: cold cereal (cheerios and rice krispies, chex) with berries and milk (whole milk - he reports not enjoying lower fat milk). on the weekend he will have eggs. OJ with Pills. L: sandwich (ham and cheese on whole wheat bread) he dilutes his tea 1:4 (unsweeteded) D: meat (chicken, fish, red meat - lean meat) with vegetable (broccoli, mixed frozen vegetables, etc) with small dessert (nothing rich). He does the cooking for himself and his wife; generally he does not use fat with cooking - he uses some olive oil or canola oil or he uses broth to help with cooking, he does not salt his food during or after cooking. He was having difficulty during our conversation remebering some specific things about his diet. He reports not snacking most of the day - this is something he has worked on for a while. Drinks: unsweetened tea he has diluted and sometimes some lemonade in this mixture. He reports his BG "wanders around" - he reports not checking his BG regularly right now. His last A1C was 7.1 in March. Reviewed heart healthy eating, T2DM MNT, and pulmonary MNT. He would not like to make any changes at this time; encouraged to eat good source of protein at each meal and to increase variety of plant foods.      Intervention Plan    Intervention Prescribe, educate and counsel regarding individualized specific dietary modifications aiming towards targeted core components such as  weight, hypertension, lipid management, diabetes, heart failure and other comorbidities.    Expected Outcomes Short Term Goal: Understand basic principles of dietary content, such as calories, fat, sodium, cholesterol and nutrients.;Short Term Goal: A plan has been developed with personal nutrition goals set during dietitian appointment.;Long Term Goal: Adherence to prescribed nutrition plan.           Goals reviewed with patient; copy given to patient.

## 2022-06-18 ENCOUNTER — Encounter: Payer: Medicare Other | Admitting: *Deleted

## 2022-06-18 DIAGNOSIS — J439 Emphysema, unspecified: Secondary | ICD-10-CM | POA: Diagnosis not present

## 2022-06-18 NOTE — Progress Notes (Signed)
Daily Session Note  Patient Details  Name: Jose Ray MRN: [id number] Date of Birth: 22-Jun-1941 Referring Provider:   Flowsheet Row Pulmonary Rehab from 03/02/2022 in [facility] Cardiac and Pulmonary Rehab  Referring Provider Wallene Huh MD       Encounter Date: 06/18/2022  Check In:  Session Check In - 06/18/22 0934       Check-In   Supervising physician immediately available to respond to emergencies See telemetry face sheet for immediately available ER MD    Location [facility]-Cardiac & Pulmonary Rehab    Staff Present Heath Lark, RN, BSN, CCRP;Jessica [place], MA, RCEP, CCRP, CCET;Joseph [place], [place]    Virtual Visit No    Medication changes reported     No    Fall or balance concerns reported    No    Warm-up and Cool-down Performed on first and last piece of equipment    Resistance Training Performed Yes    VAD Patient? No    PAD/SET Patient? No      Pain Assessment   Currently in Pain? No/denies                Social History   Tobacco Use  Smoking Status Former   Packs/day: 1.00   Years: 35.00   Total pack years: 35.00   Types: Cigarettes   Quit date: 1995   Years since quitting: 28.7  Smokeless Tobacco Never    Goals Met:  Proper associated with RPD/PD & O2 Sat Independence with exercise equipment Exercise tolerated well No report of concerns or symptoms today  Goals Unmet:  Not Applicable  Comments: Pt able to follow exercise prescription today without complaint.  Will continue to monitor for progression.    Dr. Emily Filbert is Medical Director for [place].  Dr. Ottie Glazier is Medical Director for [facility] Pulmonary Rehabilitation.

## 2022-06-21 ENCOUNTER — Encounter: Payer: Medicare Other | Admitting: *Deleted

## 2022-06-21 DIAGNOSIS — J439 Emphysema, unspecified: Secondary | ICD-10-CM | POA: Diagnosis not present

## 2022-06-21 NOTE — Progress Notes (Signed)
Daily Session Note  Patient Details  Name: Jose Ray MRN: [id number] Date of Birth: 1941-04-29 Referring Provider:   Flowsheet Row Pulmonary Rehab from 03/02/2022 in [facility] Cardiac and Pulmonary Rehab  Referring Provider Wallene Huh MD       Encounter Date: 06/21/2022  Check In:  Session Check In - 06/21/22 1027       Check-In   Supervising physician immediately available to respond to emergencies See telemetry face sheet for immediately available ER MD    Location [facility]-Cardiac & Pulmonary Rehab    Staff Present Heath Lark, RN, BSN, Laveda Norman, BS, ACSM CEP, Exercise Physiologist;Noah Tickle, BS, Exercise Physiologist    Virtual Visit No    Medication changes reported     No    Fall or balance concerns reported    No    Warm-up and Cool-down Performed on first and last piece of equipment    Resistance Training Performed Yes    VAD Patient? No    PAD/SET Patient? No      Pain Assessment   Currently in Pain? No/denies                Social History   Tobacco Use  Smoking Status Former   Packs/day: 1.00   Years: 35.00   Total pack years: 35.00   Types: Cigarettes   Quit date: 1995   Years since quitting: 28.7  Smokeless Tobacco Never    Goals Met:  Proper associated with RPD/PD & O2 Sat Independence with exercise equipment Exercise tolerated well No report of concerns or symptoms today  Goals Unmet:  Not Applicable  Comments: Pt able to follow exercise prescription today without complaint.  Will continue to monitor for progression.    Dr. Emily Filbert is Medical Director for [place].  Dr. Ottie Glazier is Medical Director for [facility] Pulmonary Rehabilitation.

## 2022-06-23 ENCOUNTER — Encounter: Payer: Medicare Other | Admitting: *Deleted

## 2022-06-23 DIAGNOSIS — J439 Emphysema, unspecified: Secondary | ICD-10-CM | POA: Diagnosis not present

## 2022-06-23 NOTE — Progress Notes (Signed)
Daily Session Note  Patient Details  Name: WOODLEY PETZOLD MRN: [id number] Date of Birth: May 09, 1941 Referring Provider:   Flowsheet Row Pulmonary Rehab from 03/02/2022 in [facility] Cardiac and Pulmonary Rehab  Referring Provider Wallene Huh MD       Encounter Date: 06/23/2022  Check In:  Session Check In - 06/23/22 1040       Check-In   Supervising physician immediately available to respond to emergencies See telemetry face sheet for immediately available ER MD    Location [facility]-Cardiac & Pulmonary Rehab    Staff Present Heath Lark, RN, BSN, CCRP;Joseph [place], RCP,RRT,BSRT;Noah Tickle, [place], Exercise Physiologist;Other   Kathee Delton, BS, Exercise Physiologist     Darlyne Russian RN ADN   Virtual Visit No    Medication changes reported     No    Fall or balance concerns reported    No    Warm-up and Cool-down Performed on first and last piece of equipment    Resistance Training Performed Yes    VAD Patient? No    PAD/SET Patient? No      Pain Assessment   Currently in Pain? No/denies                Social History   Tobacco Use  Smoking Status Former   Packs/day: 1.00   Years: 35.00   Total pack years: 35.00   Types: Cigarettes   Quit date: 1995   Years since quitting: 28.7  Smokeless Tobacco Never    Goals Met:  Proper associated with RPD/PD & O2 Sat Independence with exercise equipment Exercise tolerated well No report of concerns or symptoms today  Goals Unmet:  Not Applicable  Comments: Pt able to follow exercise prescription today without complaint.  Will continue to monitor for progression.    Dr. Emily Filbert is Medical Director for [place].  Dr. Ottie Glazier is Medical Director for [facility] Pulmonary Rehabilitation.

## 2022-06-25 ENCOUNTER — Encounter: Payer: Medicare Other | Admitting: *Deleted

## 2022-06-25 DIAGNOSIS — J439 Emphysema, unspecified: Secondary | ICD-10-CM

## 2022-06-25 NOTE — Progress Notes (Signed)
Daily Session Note  Patient Details  Name: Jose Ray MRN: [id number] Date of Birth: [birth year] Referring Provider:   Flowsheet Row Pulmonary Rehab from 03/02/2022 in [facility] Cardiac and Pulmonary Rehab  Referring Provider Wallene Huh MD       Encounter Date: 06/25/2022  Check In:  Session Check In - 06/25/22 1014       Check-In   Supervising physician immediately available to respond to emergencies See telemetry face sheet for immediately available ER MD    Location [facility]-Cardiac & Pulmonary Rehab    Staff Present Heath Lark, RN, BSN, CCRP;Joseph Tessie Fass, RCP,RRT,BSRT;Other   Kathee Delton BS,Exercise Physiologist   Virtual Visit No    Medication changes reported     No    Fall or balance concerns reported    No    Warm-up and Cool-down Performed on first and last piece of equipment    Resistance Training Performed Yes    VAD Patient? No    PAD/SET Patient? No      Pain Assessment   Currently in Pain? No/denies                Social History   Tobacco Use  Smoking Status Former   Packs/day: 1.00   Years: 35.00   Total pack years: 35.00   Types: Cigarettes   Quit date: 1995   Years since quitting: 28.7  Smokeless Tobacco Never    Goals Met:  Proper associated with RPD/PD & O2 Sat Independence with exercise equipment Exercise tolerated well No report of concerns or symptoms today  Goals Unmet:  Not Applicable  Comments: Pt able to follow exercise prescription today without complaint.  Will continue to monitor for progression.    Dr. Emily Filbert is Medical Director for [place].  Dr. Ottie Glazier is Medical Director for [facility] Pulmonary Rehabilitation.

## 2022-06-28 ENCOUNTER — Encounter: Payer: Medicare Other | Admitting: *Deleted

## 2022-06-28 DIAGNOSIS — J439 Emphysema, unspecified: Secondary | ICD-10-CM

## 2022-06-28 NOTE — Progress Notes (Signed)
Daily Session Note  Patient Details  Name: Jose Ray MRN: [id number] Date of Birth: [birth year] Referring Provider:   Flowsheet Row Pulmonary Rehab from 03/02/2022 in [facility] Cardiac and Pulmonary Rehab  Referring Provider Wallene Huh MD       Encounter Date: 06/28/2022  Check In:  Session Check In - 06/28/22 1015       Check-In   Supervising physician immediately available to respond to emergencies See telemetry face sheet for immediately available ER MD    Location [facility]-Cardiac & Pulmonary Rehab    Staff Present Earlean Shawl, BS, ACSM CEP, Exercise Physiologist;Noah Tickle, BS, Exercise Physiologist;Susanne Bice, RN, BSN, CCRP    Virtual Visit No    Medication changes reported     No    Fall or balance concerns reported    No    Warm-up and Cool-down Performed on first and last piece of equipment    Resistance Training Performed Yes    VAD Patient? No    PAD/SET Patient? No      Pain Assessment   Currently in Pain? No/denies                Social History   Tobacco Use  Smoking Status Former   Packs/day: 1.00   Years: 35.00   Total pack years: 35.00   Types: Cigarettes   Quit date: 1995   Years since quitting: 28.7  Smokeless Tobacco Never    Goals Met:  Proper associated with RPD/PD & O2 Sat Independence with exercise equipment Exercise tolerated well No report of concerns or symptoms today Strength training completed today  Goals Unmet:  Not Applicable  Comments:  Jose Ray graduated today from  rehab with 36 sessions completed.  Details of the patient's exercise prescription and what He needs to do in order to continue the prescription and progress were discussed with patient.  Patient was given a copy of prescription and goals.  Patient verbalized understanding.  Jose Ray plans to continue to exercise by walking.    Dr. Emily Filbert is Medical Director for [place].  Dr. Ottie Glazier is Medical Director for [facility]  Pulmonary Rehabilitation.

## 2022-06-28 NOTE — Progress Notes (Signed)
Discharge Summary: Jose Ray (DOB: August 26, 2041)  [name] graduated today from  rehab with 36 sessions completed.  Details of the patient's exercise prescription and what He needs to do in order to continue the prescription and progress were discussed with patient.  Patient was given a copy of prescription and goals.  Patient verbalized understanding.  Rilen plans to continue to exercise by walking.    [place] Name 03/02/22 1355 06/16/22 1156       6 Minute Walk   Phase Initial Discharge    Distance 1130 feet 1220 feet    Distance % Change -- 7.9 %    Distance Feet Change -- 90 ft    Walk Time 6 minutes 6 minutes    # of Rest Breaks 0 0    MPH 2.14 2.31    METS 2.22 2.23    RPE 13 15    Perceived Dyspnea  3 3    VO2 Peak 7.77 7.82    Symptoms Yes (comment) Yes (comment)    Comments SOB SOB    Resting HR 75 bpm 82 bpm    Resting BP 134/72 104/60    Resting Oxygen Saturation  92 % 90 %    Exercise Oxygen Saturation  during 6 min walk 83 % 83 %    Max Ex. HR 96 bpm 106 bpm    Max Ex. BP 150/72 136/72    2 Minute Post BP 128/70 112/64      Interval HR   1 Minute HR 87 98    2 Minute HR 93 102    3 Minute HR 94 105    4 Minute HR 95 105    5 Minute HR 96 106    6 Minute HR 94 106    2 Minute Post HR 70 92    Interval Heart Rate? Yes Yes      Interval Oxygen   Interval Oxygen? Yes Yes    Baseline Oxygen Saturation % 92 %  RA: 88% 90 %    1 Minute Oxygen Saturation % 89 % 86 %    1 Minute Liters of Oxygen 4 L  pulsed 4 L  pulsed    2 Minute Oxygen Saturation % 85 % 86 %    2 Minute Liters of Oxygen 4 L 4 L    3 Minute Oxygen Saturation % 85 % 85 %    3 Minute Liters of Oxygen 4 L 4 L    4 Minute Oxygen Saturation % 83 % 83 %    4 Minute Liters of Oxygen 4 L 4 L    5 Minute Oxygen Saturation % 85 % 84 %    5 Minute Liters of Oxygen 4 L 4 L    6 Minute Oxygen Saturation % 83 % 83 %    6 Minute Liters of Oxygen 4 L 4 L    2 Minute Post Oxygen Saturation % 94  % 90 %    2 Minute Post Liters of Oxygen 4 L 4 L

## 2022-06-28 NOTE — Progress Notes (Signed)
Pulmonary Individual Treatment Plan  Patient Details  Name: Jose Ray MRN: [id number] Date of Birth: [birth year] Referring Provider:   Flowsheet Row Pulmonary Rehab from 03/02/2022 in [facility] Cardiac and Pulmonary Rehab  Referring Provider Wallene Huh MD       Initial Encounter Date:  Flowsheet Row Pulmonary Rehab from 03/02/2022 in [facility] Cardiac and Pulmonary Rehab  Date 03/02/22       Visit Diagnosis: Pulmonary emphysema, unspecified emphysema type ([place])  Patient's Home Medications on Admission:  Current Outpatient Medications:    aspirin 81 MG tablet, Take 81 mg by mouth at bedtime., Disp: , Rfl:    atorvastatin (LIPITOR) 80 MG tablet, Take 80 mg by mouth at bedtime. (Patient not taking: Reported on 03/23/2021), Disp: , Rfl:    atorvastatin (LIPITOR) 80 MG tablet, Take 1 tablet by mouth at bedtime. (Patient not taking: Reported on 02/08/2022), Disp: , Rfl:    Cholecalciferol 25 MCG (1000 UT) capsule, Take by mouth., Disp: , Rfl:    cyanocobalamin 1000 MCG tablet, Take by mouth., Disp: , Rfl:    escitalopram (LEXAPRO) 5 MG tablet, Take 5 mg by mouth every morning., Disp: , Rfl:    famotidine (PEPCID) 10 MG tablet, Take 10 mg by mouth daily., Disp: , Rfl:    fexofenadine (ALLEGRA) 180 MG tablet, Take 180 mg by mouth at bedtime., Disp: , Rfl:    fluticasone (FLONASE) 50 MCG/ACT nasal spray, Place 2 sprays into both nostrils at bedtime. (Patient not taking: Reported on 02/08/2022), Disp: , Rfl:    fluticasone (FLONASE) 50 MCG/ACT nasal spray, Place 2 sprays into both nostrils daily., Disp: , Rfl:    fluticasone-salmeterol (ADVAIR) 250-50 MCG/ACT AEPB, Advair Diskus 250 mcg-50 mcg/dose powder for inhalation (Patient not taking: Reported on 02/08/2022), Disp: , Rfl:    Fluticasone-Salmeterol (ADVAIR) 250-50 MCG/DOSE AEPB, Inhale 1 puff into the lungs 2 (two) times daily., Disp: , Rfl:    glipiZIDE (GLUCOTROL) 10 MG tablet, Take 10 mg by mouth daily before breakfast., Disp: , Rfl:     hydrochlorothiazide (HYDRODIURIL) 25 MG tablet, Take 25 mg by mouth daily., Disp: , Rfl:    hydrochlorothiazide (HYDRODIURIL) 25 MG tablet, Take 1 tablet by mouth daily. (Patient not taking: Reported on 03/23/2021), Disp: , Rfl:    icosapent Ethyl (VASCEPA) 1 g capsule, Take 2 g by mouth 2 (two) times daily., Disp: , Rfl:    isosorbide mononitrate (IMDUR) 30 MG 24 hr tablet, Take 30 mg by mouth daily. (Patient not taking: Reported on 02/08/2022), Disp: , Rfl:    isosorbide mononitrate (IMDUR) 30 MG 24 hr tablet, Take 1 tablet by mouth daily., Disp: , Rfl:    losartan (COZAAR) 100 MG tablet, Take 1 tablet by mouth daily., Disp: , Rfl:    losartan (COZAAR) 50 MG tablet, Take 100 mg by mouth daily. (Patient not taking: Reported on 02/08/2022), Disp: , Rfl:    metFORMIN (GLUCOPHAGE) 1000 MG tablet, Take 1,000 mg by mouth 2 (two) times daily., Disp: , Rfl:    metoprolol succinate (TOPROL-XL) 100 MG 24 hr tablet, Take 50 mg by mouth daily. Take with or immediately following a meal. (Patient not taking: Reported on 03/23/2021), Disp: , Rfl:    metoprolol succinate (TOPROL-XL) 25 MG 24 hr tablet, Take 1 tablet by mouth daily., Disp: , Rfl:    Multiple Vitamins-Minerals (PRESERVISION AREDS 2) CAPS, Take 1 capsule by mouth at bedtime., Disp: , Rfl:    OXYGEN, Inhale 2.5-3 L into the lungs See admin instructions. Used while  sleeping and physical activity, Disp: , Rfl:    Polyethyl Glycol-Propyl Glycol 0.4-0.3 % SOLN, Place 1 drop into both eyes See admin instructions. Instill 1 drop into both eyes at night, may use during the day as needed for dry eyes, Disp: , Rfl:    rosuvastatin (CRESTOR) 10 MG tablet, Take 10 mg by mouth every other day., Disp: , Rfl:    tiotropium (SPIRIVA) 18 MCG inhalation capsule, Place 18 mcg into inhaler and inhale daily., Disp: , Rfl:    TRELEGY ELLIPTA 100-62.5-25 MCG/ACT AEPB, SMARTSIG:1 inhalation Via Inhaler Daily, Disp: , Rfl:   Past Medical History: Past Medical History:   Diagnosis Date   Acute MI ([place])    CHF (congestive heart failure) (HCC)    COPD (chronic obstructive pulmonary disease) ([place])    Coronary artery disease    Diabetes mellitus without complication (HCC)    GERD (gastroesophageal reflux disease)    History of hiatal hernia    Hypertension     Tobacco Use: Social History   Tobacco Use  Smoking Status Former   Packs/day: 1.00   Years: 35.00   Total pack years: 35.00   Types: Cigarettes   Quit date: 1995   Years since quitting: 28.7  Smokeless Tobacco Never    Labs: Review Flowsheet       Latest Ref Rng & Units 10/25/2016  Labs for ITP Cardiac and Pulmonary Rehab  Hemoglobin A1c 4.8 - 5.6 % 7.0      Pulmonary Assessment Scores:  Pulmonary Assessment Scores     Row Name 03/02/22 [phone] 06/16/22 1158       ADL UCSD   ADL Phase Entry Exit    SOB Score total 55 --    Rest 2 --    Walk 2 --    Stairs 4 --    Bath 2 --    Dress 1 --    Shop 3 --      CAT Score   CAT Score 18 --      mMRC Score   mMRC Score 3 1             UCSD: Self-administered rating of dyspnea associated with activities of daily living (ADLs) 6-point scale (0 = "not at all" to 5 = "maximal or unable to do because of breathlessness")  Scoring Scores range from 0 to 120.  Minimally important difference is 5 units  CAT: CAT can identify the health impairment of COPD patients and is better correlated with disease progression.  CAT has a scoring range of zero to 40. The CAT score is classified into four groups of low (less than 10), medium (10 - 20), high (21-30) and very high (31-40) based on the impact level of disease on health status. A CAT score over 10 suggests significant symptoms.  A worsening CAT score could be explained by an exacerbation, poor medication adherence, poor inhaler technique, or progression of COPD or comorbid conditions.  CAT MCID is 2 points  mMRC: mMRC (Modified Medical Research Council) Dyspnea Scale is used to  assess the degree of baseline functional disability in patients of respiratory disease due to dyspnea. No minimal important difference is established. A decrease in score of 1 point or greater is considered a positive change.   Pulmonary Function Assessment:  Pulmonary Function Assessment - 02/08/22 1015       Breath   Shortness of Breath Yes;Limiting activity             Exercise Target  Goals: Exercise Program Goal: Individual exercise prescription set using results from initial 6 min walk test and THRR while considering  patient's activity barriers and safety.   Exercise Prescription Goal: Initial exercise prescription builds to 30-45 minutes a day of aerobic activity, 2-3 days per week.  Home exercise guidelines will be given to patient during program as part of exercise prescription that the participant will acknowledge.  Education: Aerobic Exercise: - Group verbal and visual presentation on the components of exercise prescription. Introduces F.I.T.T principle from ACSM for exercise prescriptions.  Reviews F.I.T.T. principles of aerobic exercise including progression. Written material given at graduation. Flowsheet Row Pulmonary Rehab from 06/24/2021 in [facility] Cardiac and Pulmonary Rehab  Date 06/03/21  Educator [facility]  Instruction Review Code 1- Verbalizes Understanding       Education: Resistance Exercise: - Group verbal and visual presentation on the components of exercise prescription. Introduces F.I.T.T principle from ACSM for exercise prescriptions  Reviews F.I.T.T. principles of resistance exercise including progression. Written material given at graduation. Flowsheet Row Pulmonary Rehab from 06/23/2022 in [facility] Cardiac and Pulmonary Rehab  Date 06/23/22  Educator NT  Instruction Review Code 1- Verbalizes Understanding        Education: Exercise & Equipment Safety: - Individual verbal instruction and demonstration of equipment use and safety with use of the  equipment. Flowsheet Row Pulmonary Rehab from 06/23/2022 in [facility] Cardiac and Pulmonary Rehab  Date 02/08/22  Educator [facility]  Instruction Review Code 1- Verbalizes Understanding       Education: Exercise Physiology & General Exercise Guidelines: - Group verbal and written instruction with models to review the exercise physiology of the cardiovascular system and associated critical values. Provides general exercise guidelines with specific guidelines to those with heart or lung disease.  Flowsheet Row Pulmonary Rehab from 06/23/2022 in [facility] Cardiac and Pulmonary Rehab  Date 04/07/22  Educator NT  Instruction Review Code 1- [employer] Understanding       Education: Flexibility, Balance, Mind/Body Relaxation: - Group verbal and visual presentation with interactive activity on the components of exercise prescription. Introduces F.I.T.T principle from ACSM for exercise prescriptions. Reviews F.I.T.T. principles of flexibility and balance exercise training including progression. Also discusses the mind body connection.  Reviews various relaxation techniques to help reduce and manage stress (i.e. Deep breathing, progressive muscle relaxation, and visualization). Balance handout provided to take home. Written material given at graduation. Flowsheet Row Pulmonary Rehab from 06/23/2022 in [facility] Cardiac and Pulmonary Rehab  Date 06/23/22  Educator NT  Instruction Review Code 1- Verbalizes Understanding       Activity Barriers & Risk Stratification:  Activity Barriers & Cardiac Risk Stratification - 03/02/22 1355       Activity Barriers & Cardiac Risk Stratification   Activity Barriers Shortness of Breath;Muscular Weakness             6 Minute Walk:  6 Minute Walk     Row Name 03/02/22 1355 06/16/22 1156       6 Minute Walk   Phase Initial Discharge    Distance 1130 feet 1220 feet    Distance % Change -- 7.9 %    Distance Feet Change -- 90 ft    Walk Time 6 minutes 6 minutes    # of  Rest Breaks 0 0    MPH 2.14 2.31    METS 2.22 2.23    RPE 13 15    Perceived Dyspnea  3 3    VO2 Peak 7.77 7.82    Symptoms Yes (  comment) Yes (comment)    Comments SOB SOB    Resting HR 75 bpm 82 bpm    Resting BP 134/72 104/60    Resting Oxygen Saturation  92 % 90 %    Exercise Oxygen Saturation  during 6 min walk 83 % 83 %    Max Ex. HR 96 bpm 106 bpm    Max Ex. BP 150/72 136/72    2 Minute Post BP 128/70 112/64      Interval HR   1 Minute HR 87 98    2 Minute HR 93 102    3 Minute HR 94 105    4 Minute HR 95 105    5 Minute HR 96 106    6 Minute HR 94 106    2 Minute Post HR 70 92    Interval Heart Rate? Yes Yes      Interval Oxygen   Interval Oxygen? Yes Yes    Baseline Oxygen Saturation % 92 %  RA: 88% 90 %    1 Minute Oxygen Saturation % 89 % 86 %    1 Minute Liters of Oxygen 4 L  pulsed 4 L  pulsed    2 Minute Oxygen Saturation % 85 % 86 %    2 Minute Liters of Oxygen 4 L 4 L    3 Minute Oxygen Saturation % 85 % 85 %    3 Minute Liters of Oxygen 4 L 4 L    4 Minute Oxygen Saturation % 83 % 83 %    4 Minute Liters of Oxygen 4 L 4 L    5 Minute Oxygen Saturation % 85 % 84 %    5 Minute Liters of Oxygen 4 L 4 L    6 Minute Oxygen Saturation % 83 % 83 %    6 Minute Liters of Oxygen 4 L 4 L    2 Minute Post Oxygen Saturation % 94 % 90 %    2 Minute Post Liters of Oxygen 4 L 4 L            Oxygen Initial Assessment:  Oxygen Initial Assessment - 03/02/22 0950       Home Oxygen   Home Oxygen Device Portable Concentrator;E-Tanks;Home Concentrator    Sleep Oxygen Prescription Continuous    Liters per minute 3    Home Exercise Oxygen Prescription Continuous    Liters per minute 3    Home Resting Oxygen Prescription Continuous    Liters per minute 0   Uses PRN- encouraged to use all times above 88%   Compliance with Home Oxygen Use Yes      Initial 6 min Walk   Oxygen Used Pulsed    Liters per minute 4      Program Oxygen Prescription   Program Oxygen  Prescription Continuous    Liters per minute 2   Reaching out to MD for increase in O2 for prescription     Intervention   Short Term Goals To learn and demonstrate proper pursed lip breathing techniques or other breathing techniques. ;To learn and exhibit compliance with exercise, home and travel O2 prescription;To learn and understand importance of monitoring SPO2 with pulse oximeter and demonstrate accurate use of the pulse oximeter.;To learn and understand importance of maintaining oxygen saturations>88%;To learn and demonstrate proper use of respiratory medications    Long  Term Goals Exhibits compliance with exercise, home  and travel O2 prescription;Verbalizes importance of monitoring SPO2 with pulse oximeter and return  demonstration;Maintenance of O2 saturations>88%;Exhibits proper breathing techniques, such as pursed lip breathing or other method taught during program session;Compliance with respiratory medication;Demonstrates proper use of MDI's             Oxygen Re-Evaluation:  Oxygen Re-Evaluation     Row Name 03/12/22 0945 04/07/22 0933 05/03/22 0957 05/26/22 0951       Program Oxygen Prescription   Program Oxygen Prescription Continuous None Continuous;E-Tanks Continuous;E-Tanks    Liters per minute _0 Comments -- -- -- He may go up to 4L occasionally when experiencing SOB.      Home Oxygen   Home Oxygen Device Portable Concentrator;E-Tanks;Home Concentrator Portable Concentrator;E-Tanks;Home Concentrator Portable Concentrator;E-Tanks;Home Concentrator Portable Concentrator;E-Tanks;Home Concentrator    Sleep Oxygen Prescription Continuous Continuous Continuous Continuous    Liters per minute _1 Home Exercise Oxygen Prescription Continuous Continuous Continuous Continuous    Liters per minute _2 Home Resting Oxygen Prescription Continuous None None None    Liters per minute 0 0 -- 0    Compliance with Home Oxygen Use Yes Yes Yes Yes       Goals/Expected Outcomes   Short Term Goals To learn and demonstrate proper pursed lip breathing techniques or other breathing techniques.  To learn and understand importance of maintaining oxygen saturations>88%;To learn and understand importance of monitoring SPO2 with pulse oximeter and demonstrate accurate use of the pulse oximeter. To learn and understand importance of maintaining oxygen saturations>88%;To learn and understand importance of monitoring SPO2 with pulse oximeter and demonstrate accurate use of the pulse oximeter.;To learn and demonstrate proper pursed lip breathing techniques or other breathing techniques. ;To learn and exhibit compliance with exercise, home and travel O2 prescription;To learn and demonstrate proper use of respiratory medications To learn and understand importance of maintaining oxygen saturations>88%;To learn and understand importance of monitoring SPO2 with pulse oximeter and demonstrate accurate use of the pulse oximeter.;To learn and demonstrate proper pursed lip breathing techniques or other breathing techniques. ;To learn and exhibit compliance with exercise, home and travel O2 prescription;To learn and demonstrate proper use of respiratory medications    Long  Term Goals Exhibits proper breathing techniques, such as pursed lip breathing or other method taught during program session Exhibits proper breathing techniques, such as pursed lip breathing or other method taught during program session;Maintenance of O2 saturations>88% Exhibits compliance with exercise, home  and travel O2 prescription;Maintenance of O2 saturations>88%;Verbalizes importance of monitoring SPO2 with pulse oximeter and return demonstration;Exhibits proper breathing techniques, such as pursed lip breathing or other method taught during program session;Demonstrates proper use of MDI's;Compliance with respiratory medication Exhibits compliance with exercise, home  and travel O2 prescription;Maintenance of  O2 saturations>88%;Verbalizes importance of monitoring SPO2 with pulse oximeter and return demonstration;Exhibits proper breathing techniques, such as pursed lip breathing or other method taught during program session;Demonstrates proper use of MDI's;Compliance with respiratory medication    Comments Reviewed PLB technique with pt.  Talked about how it works and it's importance in maintaining their exercise saturations. Danvhas a pulse oximeter to check his oxygen saturation at home. Informed and explained why it is important to have one. Reviewed that oxygen saturations should be 88 percent and above. He states that he does not check it often and can feel when his oxygen is low. Jose Ray is doing well in rehab.  He left his oxygen in the car today, but is usually good about wearing it.  He  keeps it on at home.  He is using his pulse oximeter at home to keep close eye on saturations.  He uses his nebulizers twice a day and doing well with it. Jose Ray is doing well in rehab. He has been using his oxygen during rehab and uses his oxygen at home during activity. He states that he has been using inhalers routinely at home. He does not experience SOB with ADLs, but does experience some SOB with vigorous activity. He also states that he has been using his pulse oximeter at home to monitor his oxygen saturation.    Goals/Expected Outcomes Short: Become more profiecient at using PLB.   Long: Become independent at using PLB. Short: monitor oxygen at home with exertion. Long: maintain oxygen saturations above 88 percent independently. Short: Remember to get oxygen out of car Long: Continue to use PLB for breathing Short: Continue to monitor oxygen levels at home. Long: Continue to practice PLB for breathing             Oxygen Discharge (Final Oxygen Re-Evaluation):  Oxygen Re-Evaluation - 05/26/22 0951       Program Oxygen Prescription   Program Oxygen Prescription Continuous;E-Tanks    Liters per minute 3    Comments  He may go up to 4L occasionally when experiencing SOB.      Home Oxygen   Home Oxygen Device Portable Concentrator;E-Tanks;Home Concentrator    Sleep Oxygen Prescription Continuous    Liters per minute 3    Home Exercise Oxygen Prescription Continuous    Liters per minute 3    Home Resting Oxygen Prescription None    Liters per minute 0    Compliance with Home Oxygen Use Yes      Goals/Expected Outcomes   Short Term Goals To learn and understand importance of maintaining oxygen saturations>88%;To learn and understand importance of monitoring SPO2 with pulse oximeter and demonstrate accurate use of the pulse oximeter.;To learn and demonstrate proper pursed lip breathing techniques or other breathing techniques. ;To learn and exhibit compliance with exercise, home and travel O2 prescription;To learn and demonstrate proper use of respiratory medications    Long  Term Goals Exhibits compliance with exercise, home  and travel O2 prescription;Maintenance of O2 saturations>88%;Verbalizes importance of monitoring SPO2 with pulse oximeter and return demonstration;Exhibits proper breathing techniques, such as pursed lip breathing or other method taught during program session;Demonstrates proper use of MDI's;Compliance with respiratory medication    Comments Jose Ray is doing well in rehab. He has been using his oxygen during rehab and uses his oxygen at home during activity. He states that he has been using inhalers routinely at home. He does not experience SOB with ADLs, but does experience some SOB with vigorous activity. He also states that he has been using his pulse oximeter at home to monitor his oxygen saturation.    Goals/Expected Outcomes Short: Continue to monitor oxygen levels at home. Long: Continue to practice PLB for breathing             Initial Exercise Prescription:  Initial Exercise Prescription - 03/02/22 1300       Date of Initial Exercise RX and Referring Provider   Date 03/02/22     Referring Provider Wallene Huh MD      Oxygen   Oxygen Continuous    Liters 2   Sending request to doctor to increase O2 Rx   Maintain Oxygen Saturation 88% or higher      Treadmill   MPH 1.8  Grade 0    Minutes 15    METs 2.38      Recumbant Bike   Level 1    RPM 60    Watts 10    Minutes 15    METs 2.2      REL-XR   Level 1    Speed 50    Minutes 15    METs 2.2      T5 Nustep   Level 1    SPM 80    Minutes 15    METs 2.2      Prescription Details   Frequency (times per week) 3    Duration Progress to 30 minutes of continuous aerobic without signs/symptoms of physical distress      Intensity   THRR 40-80% of Max Heartrate 101 - 127    Ratings of Perceived Exertion 11-13    Perceived Dyspnea 0-4      Progression   Progression Continue to progress workloads to maintain intensity without signs/symptoms of physical distress.      Resistance Training   Training Prescription Yes    Weight 3 lb    Reps 10-15             Perform Capillary Blood Glucose checks as needed.  Exercise Prescription Changes:   Exercise Prescription Changes     Row Name 03/02/22 1300 03/17/22 0700 03/29/22 1500 04/12/22 0800 04/16/22 0900     Response to Exercise   Blood Pressure (Admit) 132/72 124/64 144/74 132/82 --   Blood Pressure (Exercise) 150/72 136/74 138/68 142/78 --   Blood Pressure (Exit) 128/70 102/60 112/66 122/60 --   Heart Rate (Admit) 75 bpm 86 bpm 83 bpm 92 bpm --   Heart Rate (Exercise) 96 bpm 90 bpm 101 bpm 102 bpm --   Heart Rate (Exit) 70 bpm 85 bpm 90 bpm 83 bpm --   Oxygen Saturation (Admit) 92 % 90 % 97 % 90 % --   Oxygen Saturation (Exercise) 83 % 88 % 88 % 88 % --   Oxygen Saturation (Exit) 94 % 91 % 92 % 93 % --   Rating of Perceived Exertion (Exercise) _0 --   Perceived Dyspnea (Exercise) 3 -- 4 3 --   Symptoms SOB -- SOB SOB --   Comments walk test results first day of exercise -- -- --   Duration -- Progress to 30 minutes of   aerobic without signs/symptoms of physical distress Continue with 30 min of aerobic exercise without signs/symptoms of physical distress. Continue with 30 min of aerobic exercise without signs/symptoms of physical distress. --   Intensity -- THRR unchanged THRR unchanged THRR unchanged --     Progression   Progression -- Continue to progress workloads to maintain intensity without signs/symptoms of physical distress. Continue to progress workloads to maintain intensity without signs/symptoms of physical distress. Continue to progress workloads to maintain intensity without signs/symptoms of physical distress. --   Average METs -- 2.23 2.56 2.44 --     Resistance Training   Training Prescription -- Yes Yes Yes --   Weight -- 3 lb 3 lb 3 lb --   Reps -- 10-15 10-15 10-15 --     Oxygen   Oxygen -- Continuous Continuous Continuous --   Liters -- _1 --     Treadmill   MPH -- 1._2 --   Grade -- 0 0 0 --   Minutes -- _3 --  METs -- 2.23 2.53 2.53 --     Recumbant Bike   Level -- -- 2 2 --   Watts -- -- 22 22 --   Minutes -- -- 15 15 --   METs -- -- 2.33 2.33 --     REL-XR   Level -- _0 --   Minutes -- _1 --   METs -- -- 2 3 --     T5 Nustep   Level -- -- 1 3 --   Minutes -- -- 15 15 --   METs -- -- 2.34 1.9 --     Home Exercise Plan   Plans to continue exercise at -- -- -- -- Home (comment)  walking, staff videos   Frequency -- -- -- -- Add 2 additional days to program exercise sessions.   Initial Home Exercises Provided -- -- -- -- 04/16/22     Oxygen   Maintain Oxygen Saturation -- 88% or higher 88% or higher 88% or higher --    Row Name 04/26/22 0800 05/11/22 0800 05/24/22 1000 06/07/22 1000 06/21/22 1500     Response to Exercise   Blood Pressure (Admit) 122/62 126/64 118/62 112/72 144/74   Blood Pressure (Exercise) 126/70 -- -- -- --   Blood Pressure (Exit) 108/60 122/62 120/68 116/66 122/60   Heart Rate (Admit) 86 bpm 77 bpm 75 bpm 83 bpm 87  bpm   Heart Rate (Exercise) 92 bpm 93 bpm 92 bpm 105 bpm 97 bpm   Heart Rate (Exit) 89 bpm 81 bpm 83 bpm 80 bpm 89 bpm   Oxygen Saturation (Admit) 93 % 91 % 89 % 92 % 87 %   Oxygen Saturation (Exercise) 88 % 88 % 88 % 88 % 90 %   Oxygen Saturation (Exit) 94 % 93 % 91 % 94 % 90 %   Rating of Perceived Exertion (Exercise) _2 Perceived Dyspnea (Exercise) _3 Symptoms _4    Duration Continue with 30 min of aerobic exercise without signs/symptoms of physical distress. Continue with 30 min of aerobic exercise without signs/symptoms of physical distress. Continue with 30 min of aerobic exercise without signs/symptoms of physical distress. Continue with 30 min of aerobic exercise without signs/symptoms of physical distress. Continue with 30 min of aerobic exercise without signs/symptoms of physical distress.   Intensity _5      Progression   Progression Continue to progress workloads to maintain intensity without signs/symptoms of physical distress. Continue to progress workloads to maintain intensity without signs/symptoms of physical distress. Continue to progress workloads to maintain intensity without signs/symptoms of physical distress. Continue to progress workloads to maintain intensity without signs/symptoms of physical distress. Continue to progress workloads to maintain intensity without signs/symptoms of physical distress.   Average METs 2.4 1.89 2.42 2.37 2.87     Resistance Training   Training Prescription _6    Weight 3 lb 3 lb 3 lb 3 lb 3 lb   Reps 10-15 10-15 10-15 10-15 10-15     Interval Training   Interval Training -- -- -- -- No     Oxygen   Oxygen _7    Liters _8 Treadmill   MPH 1.8 1.8 1.7 1.7 1.7   Grade 0 0 0 0 0   Minutes _9 15  15   METs 2.38 2.38 2.3 2.3 2.3     Recumbant  Bike   Level 2 -- _0 Watts 20 -- -- -- 25   Minutes 15 -- _1 METs 2.34 -- 2.33 2.33 2.39     Arm Ergometer   Level -- 1 -- -- --   Minutes -- 15 -- -- --   METs -- 1 -- -- --     REL-XR   Level _2 Minutes _3 METs 3.2 -- 3.3 3.1 3.8     T5 Nustep   Level 2 -- 2 2 --   Minutes 15 -- 15 15 --   METs 1.9 -- 2 2 --     Home Exercise Plan   Plans to continue exercise at Home (comment)  walking, staff videos Home (comment)  walking, staff videos Home (comment)  walking, staff videos Home (comment)  walking, staff videos Home (comment)  walking, staff videos   Frequency Add 2 additional days to program exercise sessions. Add 2 additional days to program exercise sessions. Add 2 additional days to program exercise sessions. Add 2 additional days to program exercise sessions. Add 2 additional days to program exercise sessions.   Initial Home Exercises Provided 04/16/22 04/16/22 04/16/22 04/16/22 04/16/22     Oxygen   Maintain Oxygen Saturation 88% or higher 88% or higher 88% or higher 88% or higher 88% or higher            Exercise Comments:   Exercise Comments     Row Name 03/12/22 0943           Exercise Comments First full day of exercise!  Patient was oriented to gym and equipment including functions, settings, policies, and procedures.  Patient's individual exercise prescription and treatment plan were reviewed.  All starting workloads were established based on the results of the 6 minute walk test done at initial orientation visit.  The plan for exercise progression was also introduced and progression will be customized based on patient's performance and goals.                Exercise Goals and Review:   Exercise Goals     Row Name 03/02/22 1402             Exercise Goals   Increase Physical Activity Yes       Intervention Provide advice, education, support and counseling about physical activity/exercise needs.;Develop an  individualized exercise prescription for aerobic and resistive training based on initial evaluation findings, risk stratification, comorbidities and participant's personal goals.       Expected Outcomes Short Term: Attend rehab on a regular basis to increase amount of physical activity.;Long Term: Add in home exercise to make exercise part of routine and to increase amount of physical activity.;Long Term: Exercising regularly at least 3-5 days a week.       Increase Strength and Stamina Yes       Intervention Provide advice, education, support and counseling about physical activity/exercise needs.;Develop an individualized exercise prescription for aerobic and resistive training based on initial evaluation findings, risk stratification, comorbidities and participant's personal goals.       Expected Outcomes Short Term: Increase workloads from initial exercise prescription for resistance, speed, and METs.;Short Term: Perform resistance training exercises routinely during rehab and add in resistance training at home;Long Term: Improve cardiorespiratory fitness, muscular endurance and strength as  measured by increased METs and functional capacity (6MWT)       Able to understand and use rate of perceived exertion (RPE) scale Yes       Intervention Provide education and explanation on how to use RPE scale       Expected Outcomes Short Term: Able to use RPE daily in rehab to express subjective intensity level;Long Term:  Able to use RPE to guide intensity level when exercising independently       Able to understand and use Dyspnea scale Yes       Intervention Provide education and explanation on how to use Dyspnea scale       Expected Outcomes Short Term: Able to use Dyspnea scale daily in rehab to express subjective sense of shortness of breath during exertion;Long Term: Able to use Dyspnea scale to guide intensity level when exercising independently       Knowledge and understanding of Target Heart Rate Range  (THRR) Yes       Intervention Provide education and explanation of THRR including how the numbers were predicted and where they are located for reference       Expected Outcomes Short Term: Able to state/look up THRR;Short Term: Able to use daily as guideline for intensity in rehab;Long Term: Able to use THRR to govern intensity when exercising independently       Able to check pulse independently Yes       Intervention Provide education and demonstration on how to check pulse in carotid and radial arteries.;Review the importance of being able to check your own pulse for safety during independent exercise       Expected Outcomes Short Term: Able to explain why pulse checking is important during independent exercise;Long Term: Able to check pulse independently and accurately       Understanding of Exercise Prescription Yes       Intervention Provide education, explanation, and written materials on patient's individual exercise prescription       Expected Outcomes Short Term: Able to explain program exercise prescription;Long Term: Able to explain home exercise prescription to exercise independently                Exercise Goals Re-Evaluation :  Exercise Goals Re-Evaluation     Row Name 03/12/22 0943 03/17/22 0730 03/29/22 1517 04/12/22 0905 04/16/22 0937     Exercise Goal Re-Evaluation   Exercise Goals Review Able to understand and use rate of perceived exertion (RPE) scale;Able to understand and use Dyspnea scale;Knowledge and understanding of Target Heart Rate Range (THRR);Understanding of Exercise Prescription Understanding of Exercise Prescription;Increase Physical Activity;Increase Strength and Stamina Understanding of Exercise Prescription;Increase Physical Activity;Increase Strength and Stamina Understanding of Exercise Prescription;Increase Physical Activity;Increase Strength and Stamina Understanding of Exercise Prescription;Increase Physical Activity;Increase Strength and Stamina;Able  to understand and use rate of perceived exertion (RPE) scale;Able to understand and use Dyspnea scale;Knowledge and understanding of Target Heart Rate Range (THRR);Able to check pulse independently   Comments Reviewed RPE and dyspnea scales, THR and program prescription with pt today.  Pt voiced understanding and was given a copy of goals to take home. Jose Ray is doing well to start rehab. He completed his first day of exercise. He did well on the treadmill and XR. His doctor also prescribed him 4 liters of oxygen during exercise. We will continue to monitor Sulaiman's progress in the program. Jose Ray continues to do well in rehab. He has increased to level 2 on the recumbant bike and XR. He would benefit  from adding on a small incline to the treadmill. Will continue to monitor workloads and oxygen saturations to stay above 88%. Jose Ray is doing well in rehab. He recently increased to level 3 on the T5 machine. He also improved to 3 METs on the XR machine. Jose Ray has tolerated using 3 lb hand weights for resistance training as well. We will continue to monitor his progress in the program. Reviewed home exercise with pt today.  Pt plans to walk and use staff videos at home for exercise.  Reviewed THR, pulse, RPE, sign and symptoms, pulse oximetery and when to call 911 or MD.  Also discussed weather considerations and indoor options.  Pt voiced understanding.   Expected Outcomes Short: Use RPE daily to regulate intensity. Long: Follow program prescription in THR. Short: Continue to come to rehab. Long: Continue to increase strength and stamina. Short: Add incline on treadmill Long: Continue to increase overall MEt level Short: add incline on treadmill. Long: Continue to improve strength and stamina. Short: Add in more walking at home Long: Conitnue to exercise independently    [place] Name 04/26/22 0841 05/03/22 0949 05/11/22 0843 05/24/22 1034 05/26/22 0936     Exercise Goal Re-Evaluation   Exercise Goals Review Increase  Physical Activity;Increase Strength and Stamina;Understanding of Exercise Prescription Increase Physical Activity;Increase Strength and Stamina;Understanding of Exercise Prescription Increase Physical Activity;Increase Strength and Stamina;Understanding of Exercise Prescription Increase Physical Activity;Increase Strength and Stamina;Understanding of Exercise Prescription Increase Physical Activity;Increase Strength and Stamina;Understanding of Exercise Prescription   Comments Jose Ray is doing well in rehab. He improved back to level 2 on the XR. He also improved to 2.34 METs using the recumbent bike. Jose Ray has also tolerated using 3 lb hand weights for resistance training. We will continue to monitor his progress in the program. Jose Ray is doing well in rehab.  He is walking some at home for 1 mile around the yard with the dog twice a day.  He is getting better with his stamina.  He is feeling better and planning to go to mountains next week. Jose Ray is doing well in rehab. He has done well at a speed of 1.8 mph on the treadmill. He would benefit from adding a little bit of an incline as well. He also began to use the arm ergometer and did well with it as well. We will continue to monitor his progress in the program. Jose Ray is doing well in rehab. He has been consistently at an overall average MET level above 2.4 METs. He also improved to level 3 on the XR. He did lower his speed slightly to 1.7 mph on the treadmill as well. We will continue to monitor his progress in the program. Jose Ray is doing well in rehab. He states that his walking has improved and his legs feel stronger. He also states that the leg pain he was experiencing has diminished since starting the program. He also states that exercise has improved his breathing as well. Jose Ray states that on his days away from rehab he has been walking as a way of exercise.   Expected Outcomes Short: increase speed on the treadmill. Long: Continue to improve strength and stamina.  Short: Continue to walk on off days Long; continue to improve stamina Short: add incline on the treadmill. Long: Continue to improve strength and stamina. Short: Continue to increase workload on treadmill. Long: Continue to improve strength and stamina. Short: Continue to walk on days away from rehab. Long: Continue to improve stamina.    Row  Name 06/07/22 1038 06/21/22 1502           Exercise Goal Re-Evaluation   Exercise Goals Review Increase Physical Activity;Increase Strength and Stamina;Understanding of Exercise Prescription Increase Physical Activity;Increase Strength and Stamina;Understanding of Exercise Prescription      Comments Jose Ray continues to do well in rehab. He has continued to keep his average overall MET level above 2.37 METs. He also has tolerated the treadmill at a speed of 1.7 mph and no incline. He has continued to use 3 lb hand weights for resistance training as well. We will continue to monitor his progress in the program. Jose Ray is nearing graduation.  He improved his post 6MWT by 90 ft!  He is planning to continue to exercise by walking at home.  We will continue to montior his progress.      Expected Outcomes Short: Increase workloads. Long: Continue to improve strength and stamina. Short: Graduate!  Long: Continue to improve his stamina               Discharge Exercise Prescription (Final Exercise Prescription Changes):  Exercise Prescription Changes - 06/21/22 1500       Response to Exercise   Blood Pressure (Admit) 144/74    Blood Pressure (Exit) 122/60    Heart Rate (Admit) 87 bpm    Heart Rate (Exercise) 97 bpm    Heart Rate (Exit) 89 bpm    Oxygen Saturation (Admit) 87 %    Oxygen Saturation (Exercise) 90 %    Oxygen Saturation (Exit) 90 %    Rating of Perceived Exertion (Exercise) 13    Perceived Dyspnea (Exercise) 2    Symptoms SOB    Duration Continue with 30 min of aerobic exercise without signs/symptoms of physical distress.    Intensity THRR  unchanged      Progression   Progression Continue to progress workloads to maintain intensity without signs/symptoms of physical distress.    Average METs 2.87      Resistance Training   Training Prescription Yes    Weight 3 lb    Reps 10-15      Interval Training   Interval Training No      Oxygen   Oxygen Continuous    Liters 3      Treadmill   MPH 1.7    Grade 0    Minutes 15    METs 2.3      Recumbant Bike   Level 2    Watts 25    Minutes 15    METs 2.39      REL-XR   Level 2    Minutes 15    METs 3.8      Home Exercise Plan   Plans to continue exercise at Home (comment)   walking, staff videos   Frequency Add 2 additional days to program exercise sessions.    Initial Home Exercises Provided 04/16/22      Oxygen   Maintain Oxygen Saturation 88% or higher             Nutrition:  Target Goals: Understanding of nutrition guidelines, daily intake of sodium '1500mg'$ , cholesterol '200mg'$ , calories 30% from fat and 7% or less from saturated fats, daily to have 5 or more servings of fruits and vegetables.  Education: All About Nutrition: -Group instruction provided by verbal, written material, interactive activities, discussions, models, and posters to present general guidelines for heart healthy nutrition including fat, fiber, MyPlate, the role of sodium in heart healthy nutrition, utilization of the  nutrition label, and utilization of this knowledge for meal planning. Follow up email sent as well. Written material given at graduation. Flowsheet Row Pulmonary Rehab from 06/24/2021 in [facility] Card[facility] and Pulmonary Rehab  Date 04/22/21  Educator [facility]  Instruction Review Code 1- Verbalizes Understanding       Biometrics:  Pre Biometrics - 03/02/22 1355       Pre Biometrics   Height 6' (1.829 m)    Weight 203 lb 14.4 oz (92.5 kg)    BMI (Calculated) 27.65    Single Leg Stand 1.37 seconds             Post Biometrics - 06/16/22 1158        Post   Biometrics   Height 6' (1.829 m)    Weight 210 lb 8 oz (95.5 kg)    BMI (Calculated) 28.54             Nutrition Therapy Plan and Nutrition Goals:  Nutrition Therapy & Goals - 04/26/22 0848       Nutrition Therapy   Diet Heart healthy, low Na, diabetes friendly, pulmonary MNT    Drug/Food Interactions Statins/Certain Fruits    Protein (specify units) 110-115g    Fiber 30 grams    Whole Grain Foods 3 servings    Saturated Fats 16 max. grams    Fruits and Vegetables 8 servings/day    Sodium 2 grams      Personal Nutrition Goals   Nutrition Goal ST: switch up vegetables at dinner. add nuts with breakfast LT: increase overall variety of plant foods, continue with changes made    Comments 81 y.o. M admitted to pulmonary rehab for pulmonary emphysema. PMHx includes CAD, HTN, T2DM. Relevant medications includes lipitor, vit D, vit B12, lexapro, pepcid, glipizide, hydrochlorothiazide, metformin, crestor, B complex vitamin, vit A/vit C/vitE/zinc/copper.  PYP Score: 60. Vegetables & Fruits 10/12. Breads, Grains & Cereals 7/12. Red & Processed Meat 8/12. Poultry 2/2. Fish & Shellfish 0/4. Beans, Nuts & Seeds 3/4. Milk & Dairy Foods 1/6. Toppings, Oils, Seasonings & Salt 13/20. Sweets, Snacks & Restaurant Food 7/14. Beverages 9/10.  He reports that since graduating rehab the last time around he has been trying to eat a well-balanced diet. S: boiled egg before breakfast B: cold cereal (cheerios and rice krispies, chex) with berries and milk (whole milk - he reports not enjoying lower fat milk). on the weekend he will have eggs. OJ with Pills. L: sandwich (ham and cheese on whole wheat bread) he dilutes his tea 1:4 (unsweeteded) D: meat (chicken, fish, red meat - lean meat) with vegetable (broccoli, mixed frozen vegetables, etc) with small dessert (nothing rich). He does the cooking for himself and his wife; generally he does not use fat with cooking - he uses some olive oil or canola oil or he uses  broth to help with cooking, he does not salt his food during or after cooking. He was having difficulty during our conversation remebering some specific things about his diet. He reports not snacking most of the day - this is something he has worked on for a while. Drinks: unsweetened tea he has diluted and sometimes some lemonade in this mixture. He reports his BG "wanders around" - he reports not checking his BG regularly right now. His last A1C was 7.1 in March. Reviewed heart healthy eating, T2DM MNT, and pulmonary MNT. He would not like to make any changes at this time; encouraged to eat good source of protein at each meal and  to increase variety of plant foods.      Intervention Plan   Intervention Prescribe, educate and counsel regarding individualized specific dietary modifications aiming towards targeted core components such as weight, hypertension, lipid management, diabetes, heart failure and other comorbidities.    Expected Outcomes Short Term Goal: Understand basic principles of dietary content, such as calories, fat, sodium, cholesterol and nutrients.;Short Term Goal: A plan has been developed with personal nutrition goals set during dietitian appointment.;Long Term Goal: Adherence to prescribed nutrition plan.             Nutrition Assessments:  MEDIFICTS Score Key: ?70 Need to make dietary changes  40-70 Heart Healthy Diet ? 40 Therapeutic Level Cholesterol Diet  Flowsheet Row Pulmonary Rehab from 03/02/2022 in [facility] Cardiac and Pulmonary Rehab  Picture Your Plate Total Score on Admission 60      Picture Your Plate Scores: <66 Unhealthy dietary pattern with much room for improvement. 41-50 Dietary pattern unlikely to meet recommendations for good health and room for improvement. 51-60 More healthful dietary pattern, with some room for improvement.  >60 Healthy dietary pattern, although there may be some specific behaviors that could be improved.   Nutrition Goals  Re-Evaluation:  Nutrition Goals Re-Evaluation     [place] Name 04/07/22 0936 05/03/22 0953 05/26/22 0940         Goals   Current Weight 207 lb (93.9 kg) -- --     Nutrition Goal lose some weight ST: switch up vegetables at dinner. add nuts with breakfast LT: increase overall variety of plant foods, continue with changes made ST: Continue to add and switch up vegetables at dinner LT: Continue with dietary changes discussed with RD     Comment Patient was informed on why it is important to maintain a balanced diet when dealing with Respiratory issues. Explained that it takes a lot of energy to breath and when they are short of breath often they will need to have a good diet to help keep up with the calories they are expending for breathing. Jose Ray just met with dietitian and has started to adapt changes by adding in more vegetables at dinner. He has not added the nuts at breakfast consistently yet, but he is using them as a snack in the mornings. Jose Ray states that he has still been working on incorporating some of the dietary changes he discussed with the RD. Specifically, he states that he has been adding more vegetables at dinners. He has also incorporated nuts as a part of his breakfast.     Expected Outcome Short: Choose and plan snacks accordingly to patients caloric intake to improve breathing. Long: Maintain a diet independently that meets their caloric intake to aid in daily shortness of breath. Short; continue to adapt changes Long: conitnue to eat a good vareity. Short; continue to adapt changes Long: conitnue to eat a good vareity.              Nutrition Goals Discharge (Final Nutrition Goals Re-Evaluation):  Nutrition Goals Re-Evaluation - 05/26/22 0940       Goals   Nutrition Goal ST: Continue to add and switch up vegetables at dinner LT: Continue with dietary changes discussed with RD    Comment Jose Ray states that he has still been working on incorporating some of the dietary changes he  discussed with the RD. Specifically, he states that he has been adding more vegetables at dinners. He has also incorporated nuts as a part of his breakfast.  Expected Outcome Short; continue to adapt changes Long: conitnue to eat a good vareity.             Psychosocial: Target Goals: Acknowledge presence or absence of significant depression and/or stress, maximize coping skills, provide positive support system. Participant is able to verbalize types and ability to use techniques and skills needed for reducing stress and depression.   Education: Stress, Anxiety, and Depression - Group verbal and visual presentation to define topics covered.  Reviews how body is impacted by stress, anxiety, and depression.  Also discusses healthy ways to reduce stress and to treat/manage anxiety and depression.  Written material given at graduation. Flowsheet Row Pulmonary Rehab from 06/23/2022 in [facility] Cardiac and Pulmonary Rehab  Date 06/02/22  Educator [facility]  Instruction Review Code 1- [employer] Understanding       Education: Sleep Hygiene -Provides group verbal and written instruction about how sleep can affect your health.  Define sleep hygiene, discuss sleep cycles and impact of sleep habits. Review good sleep hygiene tips.    Initial Review & Psychosocial Screening:  Initial Psych Review & Screening - 02/08/22 1017       Initial Review   Current issues with Current Psychotropic Meds      Family Dynamics   Good Support System? Yes    Comments Jose Ray has a great support system. His wife, daughter that lives down the street and his other sons call him and visit. He has a positive outlook on his health. He has done the program a few times and is looking to get into shape for his breathing.      Barriers   Psychosocial barriers to participate in program The patient should benefit from training in stress management and relaxation.      Screening Interventions   Interventions To provide support  and resources with identified psychosocial needs;Provide feedback about the scores to participant;Encouraged to exercise    Expected Outcomes Short Term goal: Utilizing psychosocial counselor, staff and physician to assist with identification of specific Stressors or current issues interfering with healing process. Setting desired goal for each stressor or current issue identified.;Long Term Goal: Stressors or current issues are controlled or eliminated.;Short Term goal: Identification and review with participant of any Quality of Life or Depression concerns found by scoring the questionnaire.;Long Term goal: The participant improves quality of Life and PHQ9 Scores as seen by post scores and/or verbalization of changes             Quality of Life Scores:  Scores of 19 and below usually indicate a poorer quality of life in these areas.  A difference of  2-3 points is a clinically meaningful difference.  A difference of 2-3 points in the total score of the Quality of Life Index has been associated with significant improvement in overall quality of life, self-image, physical symptoms, and general health in studies assessing change in quality of life.  PHQ-9: Review Flowsheet       03/02/2022 06/17/2021 04/29/2021 03/30/2021  Depression screen PHQ 2/9  Decreased Interest 0 1 0 0  Down, Depressed, Hopeless 0 0 0 0  PHQ - 2 Score 0 1 0 0  Altered sleeping 0 0 1 2  Tired, decreased energy 1 2 0 3  Change in appetite 1 0 0 1  Feeling bad or failure about yourself  0 0 0 0  Trouble concentrating 0 _0 Moving slowly or fidgety/restless 0 0 0 2  Suicidal thoughts 0 0 0  0  PHQ-9 Score _0 Difficult doing work/chores Not difficult at all Not difficult at all Not difficult at all Somewhat difficult   Interpretation of Total Score  Total Score Depression Severity:  1-4 = Minimal depression, 5-9 = Mild depression, 10-14 = Moderate depression, 15-19 = Moderately severe depression, 20-27 = Severe  depression   Psychosocial Evaluation and Intervention:  Psychosocial Evaluation - 02/08/22 1019       Psychosocial Evaluation & Interventions   Interventions Encouraged to exercise with the program and follow exercise prescription;Stress management education;Relaxation education    Comments Zair has a great support system. His wife, daughter that lives down the street and his other sons call him and visit. He has a positive outlook on his health. He has done the program a few times and is looking to get into shape for his breathing.    Expected Outcomes Short: Start [facility] to help with mood. Long: Maintain a healthy mental state    Continue Psychosocial Services  Follow up required by staff             Psychosocial Re-Evaluation:  Psychosocial Re-Evaluation     [place] Name 04/07/22 [phone] 05/03/22 0952 05/26/22 0943         Psychosocial Re-Evaluation   Current issues with None Identified None Identified;Current Psychotropic Meds None Identified     Comments Patient reports no issues with their current mental states, sleep, stress, depression or anxiety. Will follow up with patient in a few weeks for any changes. Jose Ray is going on vacation to the mountains.  He is looking forward to his trip.  He is feleing good mentally and balanced with his mood.  He tries to stay positive for the most part.  He is sleeping well even with being up around 6am with dog. Jose Ray denies any major stressors at this time. He also states that he is doing very well with his sleep. Jose Ray enjoys spending time with his dog and cooking for stress relief. He also states that exercise is a good stress reliever as well. Jose Ray states that he has a good support system made up by his wife and three children.     Expected Outcomes Short: Continue to exercise regularly to support mental health and notify staff of any changes. Long: maintain mental health and well being through teaching of rehab or prescribed medications  independently. Short: Enjoy trip to mountains Long: continue to stay positive Short: Continue to use attend rehab for stress relief. Long: Continue to maintain positive outlook.     Interventions Encouraged to attend Pulmonary Rehabilitation for the exercise Encouraged to attend Pulmonary Rehabilitation for the exercise Encouraged to attend Pulmonary Rehabilitation for the exercise     Continue Psychosocial Services  Follow up required by staff Follow up required by staff Follow up required by staff              Psychosocial Discharge (Final Psychosocial Re-Evaluation):  Psychosocial Re-Evaluation - 05/26/22 0943       Psychosocial Re-Evaluation   Current issues with None Identified    Comments Jose Ray denies any major stressors at this time. He also states that he is doing very well with his sleep. Jose Ray enjoys spending time with his dog and cooking for stress relief. He also states that exercise is a good stress reliever as well. Jose Ray states that he has a good support system made up by his wife and three children.    Expected Outcomes Short: Continue to  use attend rehab for stress relief. Long: Continue to maintain positive outlook.    Interventions Encouraged to attend Pulmonary Rehabilitation for the exercise    Continue Psychosocial Services  Follow up required by staff             Education: Education Goals: Education classes will be provided on a weekly basis, covering required topics. Participant will state understanding/return demonstration of topics presented.  Learning Barriers/Preferences:  Learning Barriers/Preferences - 02/08/22 1017       Learning Barriers/Preferences   Learning Barriers None    Learning Preferences None             General Pulmonary Education Topics:  Infection Prevention: - Provides verbal and written material to individual with discussion of infection control including proper hand washing and proper equipment cleaning during exercise  session. Flowsheet Row Pulmonary Rehab from 06/23/2022 in [facility] Cardiac and Pulmonary Rehab  Date 02/08/22  Educator [facility]  Instruction Review Code 1- Verbalizes Understanding       Falls Prevention: - Provides verbal and written material to individual with discussion of falls prevention and safety. Flowsheet Row Pulmonary Rehab from 06/23/2022 in [facility] Cardiac and Pulmonary Rehab  Date 02/08/22  Educator [facility]  Instruction Review Code 1- Verbalizes Understanding       Chronic Lung Disease Review: - Group verbal instruction with posters, models, PowerPoint presentations and videos,  to review new updates, new respiratory medications, new advancements in procedures and treatments. Providing information on websites and "800" numbers for continued self-education. Includes information about supplement oxygen, available portable oxygen systems, continuous and intermittent flow rates, oxygen safety, concentrators, and Medicare reimbursement for oxygen. Explanation of Pulmonary Drugs, including class, frequency, complications, importance of spacers, rinsing mouth after steroid MDI's, and proper cleaning methods for nebulizers. Review of basic lung anatomy and physiology related to function, structure, and complications of lung disease. Review of risk factors. Discussion about methods for diagnosing sleep apnea and types of masks and machines for OSA. Includes a review of the use of types of environmental controls: home humidity, furnaces, filters, dust mite/pet prevention, HEPA vacuums. Discussion about weather changes, air quality and the benefits of nasal washing. Instruction on Warning signs, infection symptoms, calling MD promptly, preventive modes, and value of vaccinations. Review of effective airway clearance, coughing and/or vibration techniques. Emphasizing that all should Create an Action Plan. Written material given at graduation. Flowsheet Row Pulmonary Rehab from 06/23/2022 in [facility] Cardiac and  Pulmonary Rehab  Education need identified 03/02/22  Date 03/24/22  Educator [facility]  Instruction Review Code 1- Verbalizes Understanding       AED/CPR: - Group verbal and written instruction with the use of models to demonstrate the basic use of the AED with the basic ABC's of resuscitation.    Anatomy and Cardiac Procedures: - Group verbal and visual presentation and models provide information about basic cardiac anatomy and function. Reviews the testing methods done to diagnose heart disease and the outcomes of the test results. Describes the treatment choices: Medical Management, Angioplasty, or Coronary Bypass Surgery for treating various heart conditions including Myocardial Infarction, Angina, Valve Disease, and Cardiac Arrhythmias.  Written material given at graduation. Flowsheet Row Pulmonary Rehab from 06/23/2022 in [facility] and Pulmonary Rehab  Date 04/21/22  Educator SB  Instruction Review Code 1- Verbalizes Understanding       Medication Safety: - Group verbal and visual instruction to review commonly prescribed medications for heart and lung disease. Reviews the medication, class of the drug, and side  effects. Includes the steps to properly store meds and maintain the prescription regimen.  Written material given at graduation. Flowsheet Row Pulmonary Rehab from 06/23/2022 in [facility] Cardiac and Pulmonary Rehab  Date 05/12/22  Educator SB  Instruction Review Code 1- Verbalizes Understanding       Other: -Provides group and verbal instruction on various topics (see comments)   Knowledge Questionnaire Score:  Knowledge Questionnaire Score - 03/02/22 1404       Knowledge Questionnaire Score   Pre Score 15/18              Core Components/Risk Factors/Patient Goals at Admission:  Personal Goals and Risk Factors at Admission - 03/02/22 1402       Core Components/Risk Factors/Patient Goals on Admission    Weight Management Yes;Weight Loss    Intervention  Weight Management: Develop a combined nutrition and exercise program designed to reach desired caloric intake, while maintaining appropriate intake of nutrient and fiber, sodium and fats, and appropriate energy expenditure required for the weight goal.;Weight Management: Provide education and appropriate resources to help participant work on and attain dietary goals.;Weight Management/Obesity: Establish reasonable short term and long term weight goals.    Admit Weight 203 lb (92.1 kg)    Goal Weight: Short Term 198 lb (89.8 kg)    Goal Weight: Long Term 193 lb (87.5 kg)    Expected Outcomes Short Term: Continue to assess and modify interventions until short term weight is achieved;Long Term: Adherence to nutrition and physical activity/exercise program aimed toward attainment of established weight goal;Weight Loss: Understanding of general recommendations for a balanced deficit meal plan, which promotes 1-2 lb weight loss per week and includes a negative energy balance of [phone] kcal/d;Understanding recommendations for meals to include 15-35% energy as protein, 25-35% energy from fat, 35-60% energy from carbohydrates, less than $RemoveB'200mg'[id number]$  of dietary cholesterol, 20-35 gm of total fiber daily;Understanding of distribution of calorie intake throughout the day with the consumption of 4-5 meals/snacks    Improve shortness of breath with ADL's Yes    Intervention Provide education, individualized exercise plan and daily activity instruction to help decrease symptoms of SOB with activities of daily living.    Expected Outcomes Short Term: Improve cardiorespiratory fitness to achieve a reduction of symptoms when performing ADLs;Long Term: Be able to perform more ADLs without symptoms or delay the onset of symptoms    Diabetes Yes    Intervention Provide education about signs/symptoms and action to take for hypo/hyperglycemia.;Provide education about proper nutrition, including hydration, and aerobic/resistive  exercise prescription along with prescribed medications to achieve blood glucose in normal ranges: Fasting glucose 65-99 mg/dL    Expected Outcomes Short Term: Participant verbalizes understanding of the signs/symptoms and immediate care of hyper/hypoglycemia, proper foot care and importance of medication, aerobic/resistive exercise and nutrition plan for blood glucose control.;Long Term: Attainment of HbA1C < 7%.    Heart Failure Yes    Intervention Provide a combined exercise and nutrition program that is supplemented with education, support and counseling about heart failure. Directed toward relieving symptoms such as shortness of breath, decreased exercise tolerance, and extremity edema.    Expected Outcomes Improve functional capacity of life;Short term: Attendance in program 2-3 days a week with increased exercise capacity. Reported lower sodium intake. Reported increased fruit and vegetable intake. Reports medication compliance.;Short term: Daily weights obtained and reported for increase. Utilizing diuretic protocols set by physician.;Long term: Adoption of self-care skills and reduction of barriers for early signs and symptoms recognition and intervention leading  to self-care maintenance.    Hypertension Yes    Intervention Provide education on lifestyle modifcations including regular physical activity/exercise, weight management, moderate sodium restriction and increased consumption of fresh fruit, vegetables, and low fat dairy, alcohol moderation, and smoking cessation.;Monitor prescription use compliance.    Expected Outcomes Short Term: Continued assessment and intervention until BP is < 140/25m HG in hypertensive participants. < 130/873mHG in hypertensive participants with diabetes, heart failure or chronic kidney disease.;Long Term: Maintenance of blood pressure at goal levels.    Lipids Yes    Intervention Provide education and support for participant on nutrition & aerobic/resistive  exercise along with prescribed medications to achieve LDL <7073mHDL >27m74m  Expected Outcomes Short Term: Participant states understanding of desired cholesterol values and is compliant with medications prescribed. Participant is following exercise prescription and nutrition guidelines.;Long Term: Cholesterol controlled with medications as prescribed, with individualized exercise RX and with personalized nutrition plan. Value goals: LDL < 70mg41mL > 40 mg.             Education:Diabetes - Individual verbal and written instruction to review signs/symptoms of diabetes, desired ranges of glucose level fasting, after meals and with exercise. Acknowledge that pre and post exercise glucose checks will be done for 3 sessions at entry of program. Flow[facility]  Date 02/08/22  Educator [facility] Code 1- Verbalizes Understanding       Know Your Numbers and Heart Failure: - Group verbal and visual instruction to discuss disease risk factors for cardiac and pulmonary disease and treatment options.  Reviews associated critical values for Overweight/Obesity, Hypertension, Cholesterol, and Diabetes.  Discusses basics of heart failure: signs/symptoms and treatments.  Introduces Heart Failure Zone chart for action plan for heart failure.  Written material given at graduation. Flowsheet Row [facility]  Date 03/17/22  Educator SB  Instruction Review Code 1- Verbalizes Understanding       Core Components/Risk Factors/Patient Goals Review:   Goals and Risk Factor Review     Row Name 04/07/22 0938 [phone] 05/26/22 0947         Core Components/Risk Factors/Patient Goals Review   Personal Goals Review Improve shortness of breath with ADL's Improve shortness of breath with ADL's;Weight Management/Obesity;Increase knowledge of respiratory medications and ability  to use respiratory devices properly.;Hypertension;Diabetes Improve shortness of breath with ADL's;Weight Management/Obesity;Hypertension;Diabetes     Review Spoke to patient about their shortness of breath and what they can do to improve. Patient has been informed of breathing techniques when starting the program. Patient is informed to tell staff if they have had any med changes and that certain meds they are taking or not taking can be causing shortness of breath. Jose Ray iLinna Hoffoing well in rehab.  His weight has been holding steady recently.  HIs sugars are doing well and he checks them on occasion at home.  His pressures are doing well and he is keeping a close eye on them.  He is doing better with his SOB, but does tend to forget to bring his oxygen into class with him.  He is doing well on his meds.  He is using his inhalers and nebulizers routinely Jose Ray sLinna Hoffes that he is comfortable with where his weight is at this time. He states that he has not been checking his blood sugars at home but does have a way to check his glucose  levels. He was encouraged to do so. Jose Ray states that he is not SOB at home with ADLs, but experiences some with vigorous activity. He also states that he is using his inhalers routinely and uses oxygen when doing activity.     Expected Outcomes Short: Attend [facility] regularly to improve shortness of breath with ADL's. Long: maintain independence with ADL's Short: Conitnue to maintain weight Long: Conitnue to montior risk factors. Short: begin to monitor blood sugars at home. Long: Continue to monitor risk factors.              Core Components/Risk Factors/Patient Goals at Discharge (Final Review):   Goals and Risk Factor Review - 05/26/22 0947       Core Components/Risk Factors/Patient Goals Review   Personal Goals Review Improve shortness of breath with ADL's;Weight Management/Obesity;Hypertension;Diabetes    Review Jose Ray states that he is comfortable with where his weight is at  this time. He states that he has not been checking his blood sugars at home but does have a way to check his glucose levels. He was encouraged to do so. Jose Ray states that he is not SOB at home with ADLs, but experiences some with vigorous activity. He also states that he is using his inhalers routinely and uses oxygen when doing activity.    Expected Outcomes Short: begin to monitor blood sugars at home. Long: Continue to monitor risk factors.             ITP Comments:  ITP Comments     Row Name 02/08/22 1026 03/02/22 0947 03/12/22 0943 03/24/22 1236 04/21/22 0812   ITP Comments In patient  Visit completed. Patient informed on EP and RD appointment and 6 Minute walk test. Patient also informed of patient health questionnaires on My Chart. Patient Verbalizes understanding. Visit diagnosis can be found in [facility] 12/08/2021. Completed 6MWT and gym orientation. Initial ITP created and sent for review to Dr. Ottie Glazier,  Medical Director. First full day of exercise!  Patient was oriented to gym and equipment including functions, settings, policies, and procedures.  Patient's individual exercise prescription and treatment plan were reviewed.  All starting workloads were established based on the results of the 6 minute walk test done at initial orientation visit.  The plan for exercise progression was also introduced and progression will be customized based on patient's performance and goals. 30 Day review completed. Medical Director ITP review done, changes made as directed, and signed approval by Medical Director. 30 Day review completed. Medical Director ITP review done, changes made as directed, and signed approval by Medical Director.    Row Name 04/26/22 1258 05/19/22 0717 06/16/22 1318 06/28/22 1016     ITP Comments Completed initial RD consultation 30 Day review completed. Medical Director ITP review done, changes made as directed, and signed approval by Medical Director. 30 Day review completed.  Medical Director ITP review done, changes made as directed, and signed approval by Medical Director. Jose Ray graduated today from  rehab with 36 sessions completed.  Details of the patient's exercise prescription and what He needs to do in order to continue the prescription and progress were discussed with patient.  Patient was given a copy of prescription and goals.  Patient verbalized understanding.  Leandrew plans to continue to exercise by walking.             Comments: Discharge ITP

## 2022-09-21 ENCOUNTER — Ambulatory Visit: Payer: Medicare Other | Attending: Otolaryngology

## 2022-09-21 DIAGNOSIS — G4733 Obstructive sleep apnea (adult) (pediatric): Secondary | ICD-10-CM | POA: Insufficient documentation

## 2022-12-16 ENCOUNTER — Emergency Department: Payer: Medicare Other

## 2022-12-16 ENCOUNTER — Emergency Department
Admission: EM | Admit: 2022-12-16 | Discharge: 2022-12-16 | Disposition: A | Discharge status: Home / Self Care | Payer: Medicare Other | Attending: Student in an Organized Health Care Education/Training Program | Admitting: Student in an Organized Health Care Education/Training Program

## 2022-12-16 ENCOUNTER — Other Ambulatory Visit: Payer: Self-pay

## 2022-12-16 DIAGNOSIS — R0789 Other chest pain: Secondary | ICD-10-CM | POA: Insufficient documentation

## 2022-12-16 DIAGNOSIS — Z9981 Dependence on supplemental oxygen: Secondary | ICD-10-CM | POA: Insufficient documentation

## 2022-12-16 DIAGNOSIS — I2510 Atherosclerotic heart disease of native coronary artery without angina pectoris: Secondary | ICD-10-CM | POA: Diagnosis not present

## 2022-12-16 DIAGNOSIS — J449 Chronic obstructive pulmonary disease, unspecified: Secondary | ICD-10-CM | POA: Diagnosis not present

## 2022-12-16 DIAGNOSIS — Z955 Presence of coronary angioplasty implant and graft: Secondary | ICD-10-CM | POA: Diagnosis not present

## 2022-12-16 DIAGNOSIS — Z7902 Long term (current) use of antithrombotics/antiplatelets: Secondary | ICD-10-CM | POA: Diagnosis not present

## 2022-12-16 LAB — COMPREHENSIVE METABOLIC PANEL
ALT: 19 U/L (ref 0–44)
AST: 25 U/L (ref 15–41)
Albumin: 3.9 g/dL (ref 3.5–5.0)
Alkaline Phosphatase: 80 U/L (ref 38–126)
Anion gap: 10 (ref 5–15)
BUN: 21 mg/dL (ref 8–23)
CO2: 23 mmol/L (ref 22–32)
Calcium: 8.9 mg/dL (ref 8.9–10.3)
Chloride: 103 mmol/L (ref 98–111)
Creatinine, Ser: 0.89 mg/dL (ref 0.61–1.24)
GFR, Estimated: >60 mL/min (ref >60)
Glucose, Bld: 136 mg/dL — ABNORMAL HIGH (ref 70–99)
Potassium: 3.9 mmol/L (ref 3.5–5.1)
Sodium: 136 mmol/L (ref 135–145)
Total Bilirubin: 0.8 mg/dL (ref 0.3–1.2)
Total Protein: 6.7 g/dL (ref 6.5–8.1)

## 2022-12-16 LAB — CBC WITH DIFFERENTIAL/PLATELET
Abs Immature Granulocytes: 0.05 10*3/uL (ref 0.00–0.07)
Basophils Absolute: 0.1 10*3/uL (ref 0.0–0.1)
Basophils Relative: 1 %
Eosinophils Absolute: 0.1 10*3/uL (ref 0.0–0.5)
Eosinophils Relative: 1 %
HCT: 47.5 % (ref 39.0–52.0)
Hemoglobin: 15.7 g/dL (ref 13.0–17.0)
Immature Granulocytes: 0 %
Lymphocytes Relative: 11 %
Lymphs Abs: 1.3 10*3/uL (ref 0.7–4.0)
MCH: 28.3 pg (ref 26.0–34.0)
MCHC: 33.1 g/dL (ref 30.0–36.0)
MCV: 85.7 fL (ref 80.0–100.0)
Monocytes Absolute: 1.2 10*3/uL — ABNORMAL HIGH (ref 0.1–1.0)
Monocytes Relative: 10 %
Neutro Abs: 9.1 10*3/uL — ABNORMAL HIGH (ref 1.7–7.7)
Neutrophils Relative %: 77 %
Platelets: 192 10*3/uL (ref 150–400)
RBC: 5.54 MIL/uL (ref 4.22–5.81)
RDW: 13.2 % (ref 11.5–15.5)
WBC: 11.8 10*3/uL — ABNORMAL HIGH (ref 4.0–10.5)
nRBC: 0 % (ref 0.0–0.2)

## 2022-12-16 LAB — TROPONIN I (HIGH SENSITIVITY)
Troponin I (High Sensitivity): 4 ng/L (ref <18)
Troponin I (High Sensitivity): 4 ng/L (ref <18)

## 2022-12-16 MED ORDER — NITROGLYCERIN 0.4 MG SL SUBL
0.4000 mg | SUBLINGUAL_TABLET | SUBLINGUAL | Status: DC | PRN
  Administered 2022-12-16: 0.4 mg via SUBLINGUAL
  Filled 2022-12-16: qty 1

## 2022-12-16 MED ORDER — IOHEXOL 350 MG/ML SOLN
75.0000 mL | Freq: Once | INTRAVENOUS | Status: AC | PRN
  Administered 2022-12-16: 75 mL via INTRAVENOUS

## 2022-12-16 NOTE — Discharge Instructions (Addendum)
IMPRESSION:  1. No evidence of pulmonary embolism or other acute findings in the  chest.  2. Stable severe emphysematous lung disease.  3. 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 This nodule currently measures 1.4 x 1.7  cm in greatest transverse dimensions. Previously this measured  approximately 1.1 x 1.5 cm on the 2018 CT.  4. Coronary atherosclerosis.  5. Aortic atherosclerosis.    Aortic Atherosclerosis (ICD10-I70.0) and Emphysema (ICD10-J43.9).      Electronically Signed    By: Jenness Corner.D.

## 2022-12-16 NOTE — ED Provider Notes (Signed)
[facility] Provider Note    Event Date/Time   First MD Initiated Contact with Patient 12/16/22 1141     (approximate)   History   Chest Pain   HPI  Jose Ray is a 82 y.o. male with a history of COPD on nightly oxygen 2 L as well as remote history of CAD status post 3 stents in 2000 presents to the ER for evaluation of midsternal nonradiating chest pain worsened with exertion some associated shortness of breath started around 3 AM this morning.  Woke him from sleep.  Denies any abdominal pain did have some nausea.  Took aspirin this morning.  He is on Plavix.  Not any blood thinners.  Feels like his breathing is roughly at his baseline.  No pleuritic pain.     Physical Exam   Triage Vital Signs: ED Triage Vitals  Enc Vitals Group     BP 12/16/22 1145 (!) 157/74     Pulse Rate 12/16/22 1135 74     Resp 12/16/22 1135 20     Temp 12/16/22 1137 (!) 97.3 F (36.3 C)     Temp Source 12/16/22 1137 Oral     SpO2 12/16/22 1135 93 %     Weight 12/16/22 1134 218 lb 4.8 oz (99 kg)     Height 12/16/22 1134 6' (1.829 m)     Head Circumference --      Peak Flow --      Pain Score 12/16/22 1137 3     Pain Loc --      Pain Edu? --      Excl. in [place]? --     Most recent vital signs: Vitals:   12/16/22 1430 12/16/22 1530  BP: 130/84 (!) 143/68  Pulse: 70 69  Resp: 14 17  Temp:    SpO2: 95% 96%     Constitutional: Alert  Eyes: Conjunctivae are normal.  Head: Atraumatic. Nose: No congestion/rhinnorhea. Mouth/Throat: Mucous membranes are moist.   Neck: Painless ROM.  Cardiovascular:   Good peripheral circulation. Respiratory: Normal respiratory effort.  No retractions.  Gastrointestinal: Soft and nontender.  Musculoskeletal:  no deformity Neurologic:  MAE spontaneously. No gross focal neurologic deficits are appreciated.  Skin:  Skin is warm, dry and intact. No rash noted. Psychiatric: Mood and affect are normal. Speech and behavior are  normal.    ED Results / Procedures / Treatments   Labs (all labs ordered are listed, but only abnormal results are displayed) Labs Reviewed  CBC WITH DIFFERENTIAL/PLATELET - Abnormal; Notable for the following components:      Result Value   WBC 11.8 (*)    Neutro Abs 9.1 (*)    Monocytes Absolute 1.2 (*)    All other components within normal limits  COMPREHENSIVE METABOLIC PANEL - Abnormal; Notable for the following components:   Glucose, Bld 136 (*)    All other components within normal limits  TROPONIN I (HIGH SENSITIVITY)  TROPONIN I (HIGH SENSITIVITY)     EKG  ED ECG REPORT I, Merlyn Lot, the attending physician, personally viewed and interpreted this ECG.   Date: 12/16/2022  EKG Time: 11:35  Rate: 70  Rhythm: sinus  Axis: normal  Intervals: normal  ST&T Change: no stemi, no depressions    RADIOLOGY Please see ED Course for my review and interpretation.  I personally reviewed all radiographic images ordered to evaluate for the above acute complaints and reviewed radiology reports and findings.  These findings were personally discussed  with the patient.  Please see medical record for radiology report.    PROCEDURES:  Critical Care performed: No  Procedures   MEDICATIONS ORDERED IN ED: Medications  nitroGLYCERIN (NITROSTAT) SL tablet 0.4 mg (0.4 mg Sublingual Given 12/16/22 1230)  iohexol (OMNIPAQUE) 350 MG/ML injection 75 mL (75 mLs Intravenous Contrast Given 12/16/22 1545)     IMPRESSION / MDM / ASSESSMENT AND PLAN / ED COURSE  I reviewed the triage vital signs and the nursing notes.                              Differential diagnosis includes, but is not limited to, ACS, pericarditis, esophagitis, boerhaaves, pe, dissection, pna, bronchitis, costochondritis  Patient presenting to the ER for evaluation of symptoms as described above.  Based on symptoms, risk factors and considered above differential, this presenting complaint could reflect a  potentially life-threatening illness therefore the patient will be placed on continuous pulse oximetry and telemetry for monitoring.  Laboratory evaluation will be sent to evaluate for the above complaints.      Clinical Course as of 12/16/22 1549  Thu Dec 16, 2022  1247 Patient with resolution of his pain and after first dose of nitro.  Will continue observe.  Chest x-ray on my review and interpretation without evidence of consolidation. [PR]  1254 First troponin is negative.  Satting well on room air.  Will continue observe. [PR]  1458 Repeat troponin negative.  Is complaining of some discomfort with deep inspiration will order CTA to rule out PE. [PR]  1531 Still waiting CTA.  Discussed the patient's presentation with Dr. Clayborn Bigness with cardiology who, agrees to see patient first thing in the morning in clinic.  Patient will be signed out to oncoming physician pending follow-up CTA if negative will be appropriate for outpatient follow-up. [PR]    Clinical Course User Index [PR] Merlyn Lot, MD    FINAL CLINICAL IMPRESSION(S) / ED DIAGNOSES   Final diagnoses:  Atypical chest pain     Rx / DC Orders   ED Discharge Orders     None        Note:  This document was prepared using Dragon voice recognition software and may include unintentional dictation errors.    Merlyn Lot, MD 12/16/22 [phone]

## 2022-12-16 NOTE — ED Triage Notes (Signed)
Patient complaining of chest pain since 0300

## 2023-02-09 ENCOUNTER — Other Ambulatory Visit (INDEPENDENT_AMBULATORY_CARE_PROVIDER_SITE_OTHER): Payer: Self-pay | Admitting: Nurse Practitioner

## 2023-02-09 DIAGNOSIS — I739 Peripheral vascular disease, unspecified: Secondary | ICD-10-CM

## 2023-02-10 ENCOUNTER — Encounter (INDEPENDENT_AMBULATORY_CARE_PROVIDER_SITE_OTHER): Payer: Self-pay | Admitting: Vascular Surgery

## 2023-02-10 ENCOUNTER — Ambulatory Visit (INDEPENDENT_AMBULATORY_CARE_PROVIDER_SITE_OTHER): Payer: Medicare Other | Admitting: Vascular Surgery

## 2023-02-10 ENCOUNTER — Ambulatory Visit (INDEPENDENT_AMBULATORY_CARE_PROVIDER_SITE_OTHER): Payer: Medicare Other

## 2023-02-10 VITALS — BP 139/74 | HR 69 | Resp 16 | Ht 72.0 in | Wt 214.0 lb

## 2023-02-10 DIAGNOSIS — I25119 Atherosclerotic heart disease of native coronary artery with unspecified angina pectoris: Secondary | ICD-10-CM | POA: Diagnosis not present

## 2023-02-10 DIAGNOSIS — I10 Essential (primary) hypertension: Secondary | ICD-10-CM | POA: Diagnosis not present

## 2023-02-10 DIAGNOSIS — I739 Peripheral vascular disease, unspecified: Secondary | ICD-10-CM | POA: Diagnosis not present

## 2023-02-10 DIAGNOSIS — J449 Chronic obstructive pulmonary disease, unspecified: Secondary | ICD-10-CM

## 2023-02-10 LAB — VAS US ABI WITH/WO TBI
Left ABI: 1.16
Right ABI: 1.13

## 2023-02-10 NOTE — Progress Notes (Signed)
MRN : [id number]  Jose Ray is a 82 y.o. (02-14-1941) male who presents with chief complaint of check circulation.  History of Present Illness:   The patient is seen for evaluation of painful lower extremities. Patient notes the pain is variable and not always associated with activity sometimes just standing such as when he is cooking elicits the pain.  The pain is somewhat consistent day to day occurring on most days. The patient notes the pain also occurs with standing and routinely seems worse as the day wears on. The pain has been progressive over the past several years. The patient states these symptoms are causing  a negative impact on quality of life and daily activities which was a factor in the referral.  The patient has a  history of back problems and DJD of the lumbar and sacral spine.   The patient denies rest pain or dangling of an extremity off the side of the bed during the night for relief. No open wounds or sores at this time. No history of DVT or phlebitis. No prior vascular interventions or surgeries.   ABI's Rt=1.13 (triphasic) and Lt=1.16 (triphasic)  No outpatient medications have been marked as taking for the 02/10/23 encounter (Appointment) with Gilda Crease, Latina Craver, MD.    Past Medical History:  Diagnosis Date   Acute MI [facility])    CHF (congestive heart failure) (HCC)    COPD (chronic obstructive pulmonary disease) (HCC)    Coronary artery disease    Diabetes mellitus without complication (HCC)    GERD (gastroesophageal reflux disease)    History of hiatal hernia    Hypertension     Past Surgical History:  Procedure Laterality Date   CORONARY ANGIOPLASTY WITH STENT PLACEMENT     CYSTOSCOPY WITH INSERTION OF UROLIFT N/A 08/08/2018   Procedure: CYSTOSCOPY WITH INSERTION OF UROLIFT;  Surgeon: Orson Ape, MD;  Location: [facility] ORS;  Service: Urology;  Laterality: N/A;   HERNIA REPAIR N/A    LEFT HEART CATH AND CORONARY ANGIOGRAPHY  Left 02/03/2021   Procedure: LEFT HEART CATH AND CORONARY ANGIOGRAPHY;  Surgeon: Lamar Blinks, MD;  Location: [facility] INVASIVE CV LAB;  Service: Cardiovascular;  Laterality: Left;    Social History Social History   Tobacco Use   Smoking status: Former    Packs/day: 1.00    Years: 35.00    Additional pack years: 0.00    Total pack years: 35.00    Types: Cigarettes    Quit date: 1995    Years since quitting: 29.3   Smokeless tobacco: Never  Vaping Use   Vaping Use: Never used  Substance Use Topics   Alcohol use: No   Drug use: No    Family History Family History  Problem Relation Age of Onset   Stomach cancer Mother    Coronary artery disease Father     Allergies  Allergen Reactions   Omega-3 Fatty Acids Anaphylaxis and Other (See Comments)    Patient unsure if this is a true allergy Syncope    Shrimp [Shellfish Allergy] Anaphylaxis   Penicillins Other (See Comments)    Patient reports feeling faint when given Has patient had a PCN reaction causing immediate rash, facial/tongue/throat swelling, SOB or lightheadedness with hypotension: No Has patient had a PCN reaction causing severe rash involving mucus membranes or skin necrosis: No Has patient had a PCN reaction that required hospitalization No Has patient had  a PCN reaction occurring within the last 10 years: No If all of the above answers are "NO", then may proceed with Cephalosporin use.      REVIEW OF SYSTEMS (Negative unless checked)  Constitutional: [] Weight loss  [] Fever  [] Chills Cardiac: [] Chest pain   [] Chest pressure   [] Palpitations   [] Shortness of breath when laying flat   [] Shortness of breath with exertion. Vascular:  [x] Pain in legs with walking   [] Pain in legs at rest  [] History of DVT   [] Phlebitis   [] Swelling in legs   [] Varicose veins   [] Non-healing ulcers Pulmonary:   [] Uses home oxygen   [] Productive cough   [] Hemoptysis   [] Wheeze  [x] COPD   [] Asthma Neurologic:  [x] Dizziness    [] Seizures   [] History of stroke   [] History of TIA  [] Aphasia   [] Vissual changes   [] Weakness or numbness in arm   [] Weakness or numbness in leg Musculoskeletal:   [] Joint swelling   [] Joint pain   [] Low back pain Hematologic:  [] Easy bruising  [] Easy bleeding   [] Hypercoagulable state   [] Anemic Gastrointestinal:  [] Diarrhea   [] Vomiting  [] Gastroesophageal reflux/heartburn   [] Difficulty swallowing. Genitourinary:  [] Chronic kidney disease   [] Difficult urination  [] Frequent urination   [] Blood in urine Skin:  [] Rashes   [] Ulcers  Psychological:  [] History of anxiety   []  History of major depression.  Physical Examination  There were no vitals filed for this visit. There is no height or weight on file to calculate BMI. Gen: WD/WN, NAD Head: [place]/AT, No temporalis wasting.  Ear/Nose/Throat: Hearing grossly intact, nares w/o erythema or drainage Eyes: PER, EOMI, sclera nonicteric.  Neck: Supple, no masses.  No bruit or JVD.  Pulmonary:  Good air movement, no audible wheezing, no use of accessory muscles.  Cardiac: RRR, normal S1, S2, no Murmurs. Vascular:  mild trophic changes, no open wounds Vessel Right Left  Radial Palpable Palpable  PT Trace  Palpable Trace Palpable  DP Palpable Palpable  Gastrointestinal: soft, non-distended. No guarding/no peritoneal signs.  Musculoskeletal: M/S 5/5 throughout.  No visible deformity.  Neurologic: CN 2-12 intact. Pain and light touch intact in extremities.  Symmetrical.  Speech is fluent. Motor exam as listed above. Psychiatric: Judgment intact, Mood & affect appropriate for pt's clinical situation. Dermatologic: No rashes or ulcers noted.  No changes consistent with cellulitis.   CBC Lab Results  Component Value Date   WBC 11.8 (H) 12/16/2022   HGB 15.7 12/16/2022   HCT 47.5 12/16/2022   MCV 85.7 12/16/2022   PLT 192 12/16/2022    BMET    Component Value Date/Time   NA 136 12/16/2022 1202   K 3.9 12/16/2022 1202   CL 103 12/16/2022  1202   CO2 23 12/16/2022 1202   GLUCOSE 136 (H) 12/16/2022 1202   BUN 21 12/16/2022 1202   CREATININE 0.89 12/16/2022 1202   CALCIUM 8.9 12/16/2022 1202   GFRNONAA >60 12/16/2022 1202   GFRAA >60 07/31/2018 1127   CrCl cannot be calculated (Patient's most recent lab result is older than the maximum 21 days allowed.).  COAG No results found for: "INR", "PROTIME"  Radiology No results found.   Assessment/Plan 1. PAD (peripheral artery disease) (HCC) Recommend:  The patient has atypical pain symptoms for vascular disease and on exam I do not find evidence of vascular pathology that would explain the patient's symptoms.  Noninvasive studies do not identify significant vascular problems  I suspect the patient is c/o pseudoclaudication.  Patient should  have an evaluation of the LS spine which I defer to the primary service or the Spine service.  The patient should continue walking and begin a more formal exercise program. The patient should continue his antiplatelet therapy and aggressive treatment of the lipid abnormalities.  Patient will follow-up with me on a PRN basis.  2. Primary hypertension Continue antihypertensive medications as already ordered, these medications have been reviewed and there are no changes at this time.  3. Coronary artery disease involving native coronary artery of native heart with angina pectoris (HCC) Continue cardiac and antihypertensive medications as already ordered and reviewed, no changes at this time.  Continue statin as ordered and reviewed, no changes at this time  Nitrates PRN for chest pain  4. Chronic obstructive pulmonary disease, unspecified COPD type (HCC) Continue pulmonary medications and aerosols as already ordered, these medications have been reviewed and there are no changes at this time.  d   Levora Dredge, MD  02/10/2023 8:32 AM

## 2023-02-12 ENCOUNTER — Encounter (INDEPENDENT_AMBULATORY_CARE_PROVIDER_SITE_OTHER): Payer: Self-pay | Admitting: Vascular Surgery

## 2023-02-15 ENCOUNTER — Other Ambulatory Visit: Payer: Self-pay | Admitting: Internal Medicine

## 2023-02-15 DIAGNOSIS — R0789 Other chest pain: Secondary | ICD-10-CM

## 2023-02-15 DIAGNOSIS — K219 Gastro-esophageal reflux disease without esophagitis: Secondary | ICD-10-CM

## 2023-02-15 DIAGNOSIS — R1310 Dysphagia, unspecified: Secondary | ICD-10-CM

## 2023-02-16 ENCOUNTER — Ambulatory Visit
Admission: RE | Admit: 2023-02-16 | Discharge: 2023-02-16 | Disposition: A | Discharge status: Home / Self Care | Payer: Medicare Other | Source: Ambulatory Visit | Attending: Internal Medicine | Admitting: Internal Medicine

## 2023-02-16 DIAGNOSIS — K219 Gastro-esophageal reflux disease without esophagitis: Secondary | ICD-10-CM | POA: Diagnosis present

## 2023-02-16 DIAGNOSIS — R0789 Other chest pain: Secondary | ICD-10-CM | POA: Diagnosis present

## 2023-02-16 DIAGNOSIS — R1310 Dysphagia, unspecified: Secondary | ICD-10-CM | POA: Insufficient documentation

## 2023-08-29 ENCOUNTER — Other Ambulatory Visit: Payer: Self-pay | Admitting: Gastroenterology

## 2023-08-29 DIAGNOSIS — R1319 Other dysphagia: Secondary | ICD-10-CM

## 2023-08-30 ENCOUNTER — Ambulatory Visit
Admission: RE | Admit: 2023-08-30 | Discharge: 2023-08-30 | Disposition: A | Discharge status: Home / Self Care | Payer: Medicare Other | Source: Ambulatory Visit | Attending: Gastroenterology | Admitting: Gastroenterology

## 2023-08-30 DIAGNOSIS — R1319 Other dysphagia: Secondary | ICD-10-CM | POA: Diagnosis present

## 2023-11-12 IMAGING — MR MR HEAD W/O CM
12 series · 46 of 48 positions shown · non-contrast
Comparison: Brain MRI 10/25/2016.

CLINICAL DATA: Provided history: Memory loss. Additional history
provided by scanning technologist: Memory loss for 1 year, episodes
more frequent in the past few months.

EXAM:
MRI HEAD WITHOUT CONTRAST
TECHNIQUE: Multiplanar, multiecho pulse sequences of the brain and surrounding
structures were obtained without intravenous contrast.

[Series 5: ax dwi_tracew · axial · 3.0mm · 0.65mm/px · z∈[-74,+77]mm · 4 of 48 slices shown]
[im 1/48]
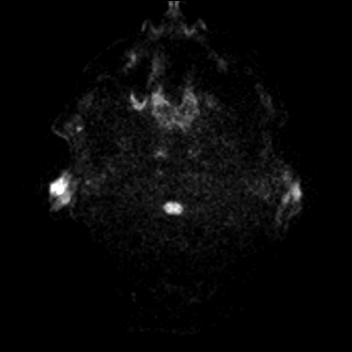
[im 16/48]
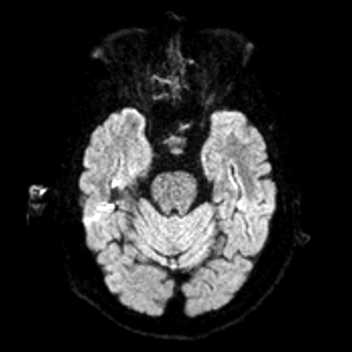
[im 32/48]
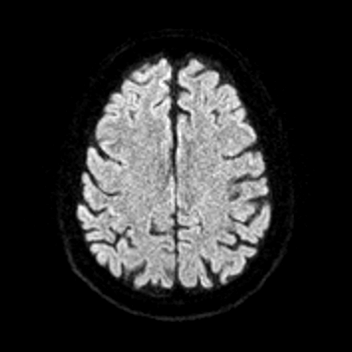
[im 48/48]
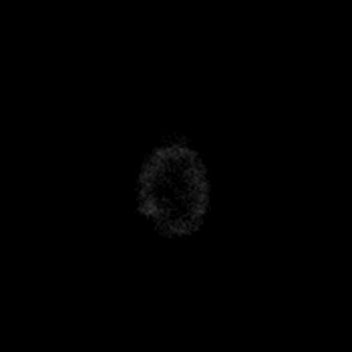

[Series 6: ax dwi_adc · axial · 3.0mm · 0.65mm/px · z∈[-74,+77]mm · 3 of 48 slices shown]
[im 1/48]
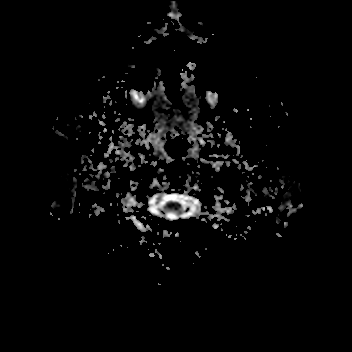
[im 24/48]
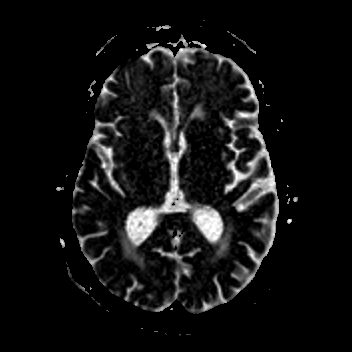
[im 48/48]
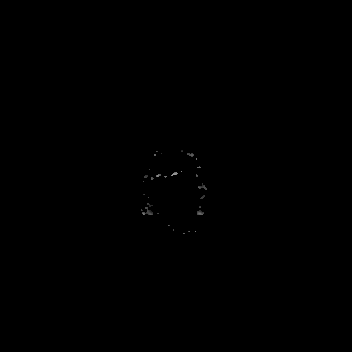

[Series 7: cor dwi_tracew · coronal · 5.0mm · 0.65mm/px · 3 of 40 slices shown]
[im 1/40]
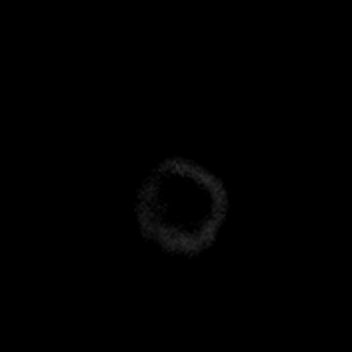
[im 20/40]
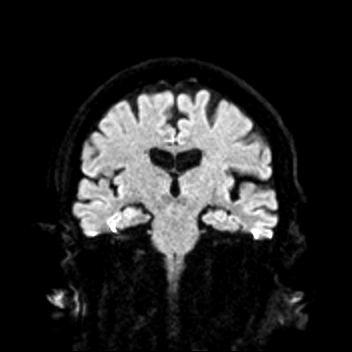
[im 40/40]
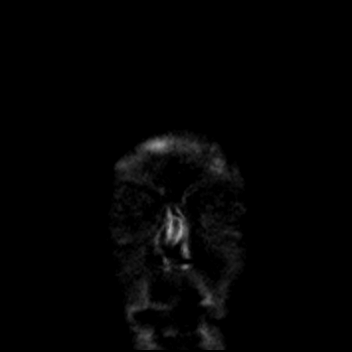

[Series 8: cor dwi_adc · coronal · 5.0mm · 0.65mm/px · 3 of 40 slices shown]
[im 1/40]
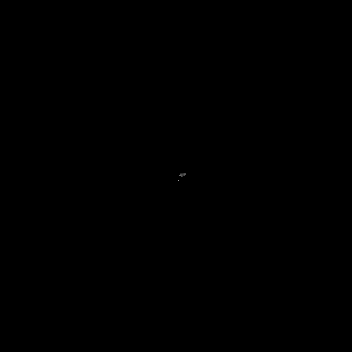
[im 20/40]
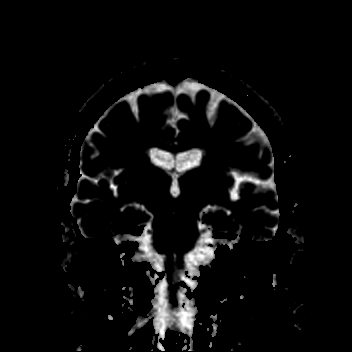
[im 40/40]
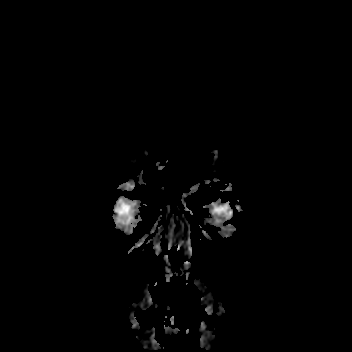

[Series 9: T1 · sagittal · 5.0mm · 0.62mm/px · 2 of 25 slices shown (1 of 2)]
[im 1/25]
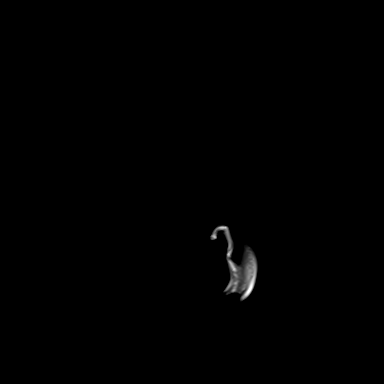
[im 25/25]
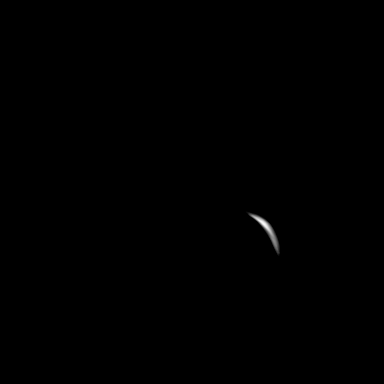

[Series 10: T2 · axial · 5.0mm · 0.53mm/px · z∈[-69,+71]mm · 2 of 25 slices shown (1 of 2)]
[im 1/25]
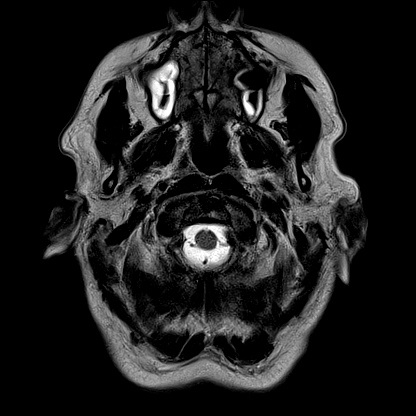
[im 25/25]
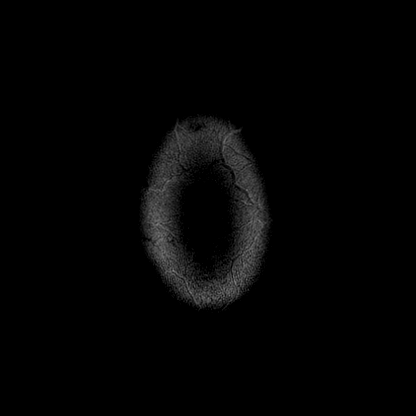

[Series 11: mag_images · axial · 3.0mm · 0.90mm/px · z∈[-84,+88]mm · 4 of 60 slices shown]
[im 1/60]
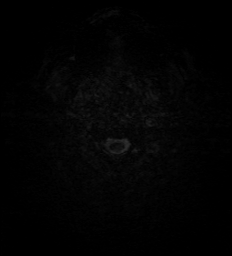
[im 20/60]
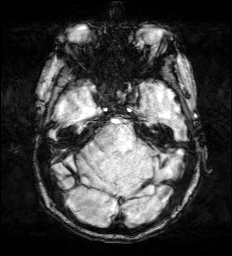
[im 40/60]
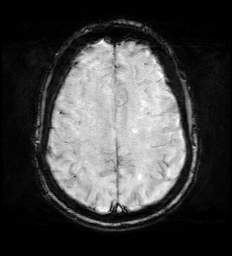
[im 60/60]
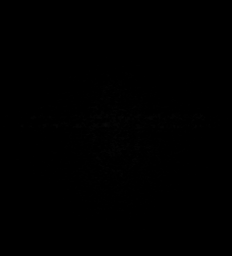

[Series 12: pha_images · axial · 3.0mm · 0.90mm/px · z∈[-84,+79]mm · 4 of 57 slices shown]
[im 1/57]
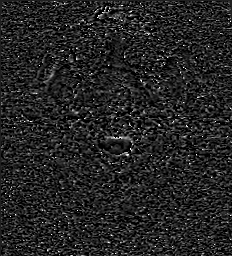
[im 19/57]
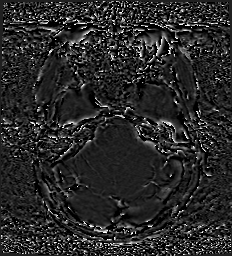
[im 38/57]
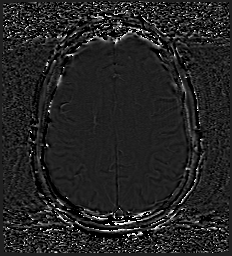
[im 57/57]
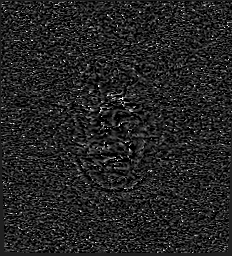

[Series 13: swi_images · axial · 3.0mm · 0.90mm/px · z∈[-84,+88]mm · 4 of 60 slices shown]
[im 1/60]
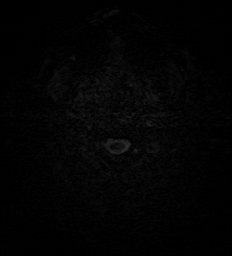
[im 20/60]
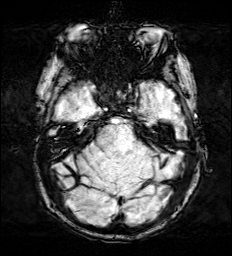
[im 40/60]
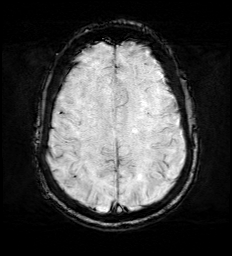
[im 60/60]
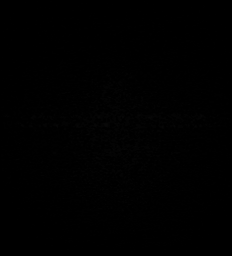

[Series 15: FLAIR · axial · 3.0mm · 0.53mm/px · z∈[-78,+79]mm · 4 of 55 slices shown]
[im 1/55]
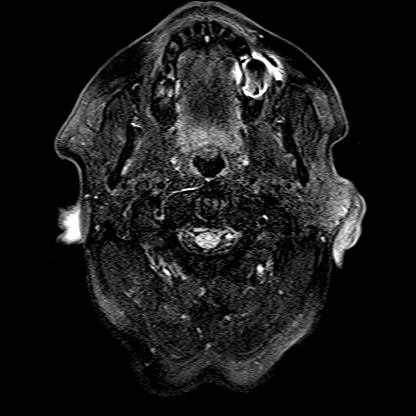
[im 19/55]
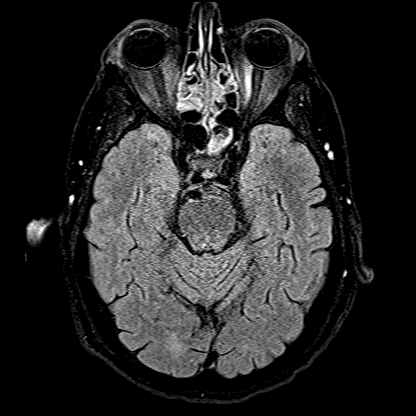
[im 37/55]
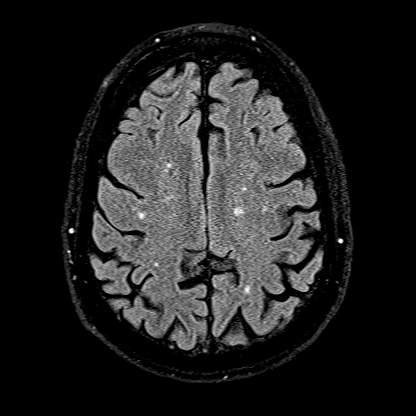
[im 55/55]
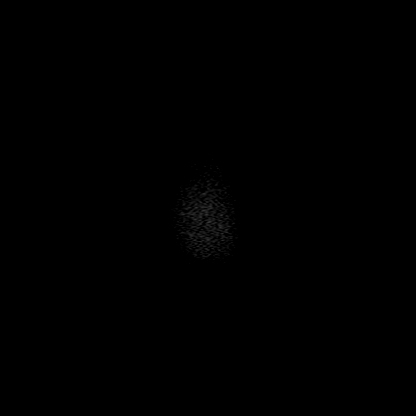

[Series 16: T1 · axial · 1.0mm · 0.98mm/px · z∈[-81,+89]mm · 11 of 176 slices shown (2 of 2)]
[im 1/176]
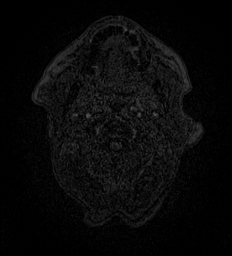
[im 15/176]
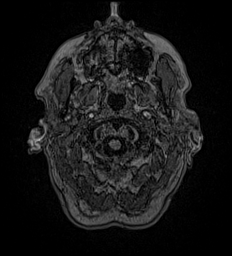
[im 30/176]
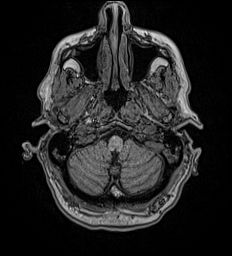
[im 44/176]
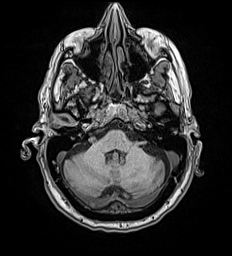
[im 59/176]
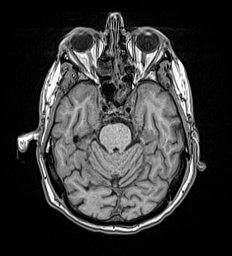
[im 73/176]
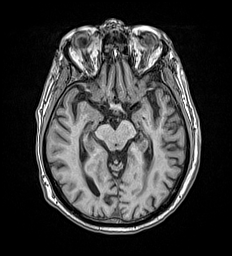
[im 88/176]
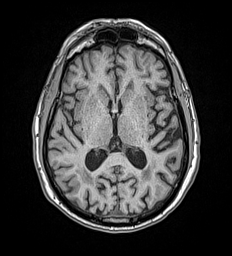
[im 103/176]
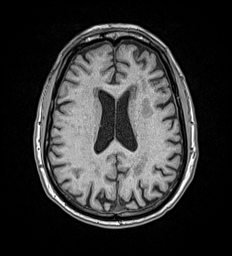
[im 117/176]
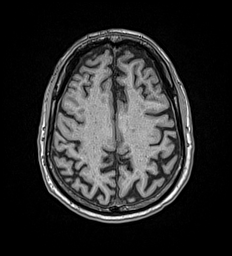
[im 146/176]
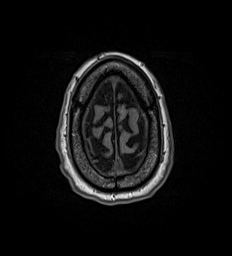
[im 176/176]
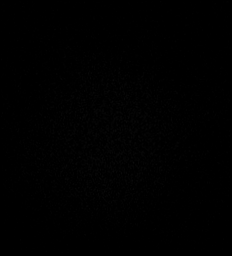

[Series 17: T2 · coronal · 5.0mm · 0.57mm/px · 2 of 29 slices shown (2 of 2)]
[im 1/29]
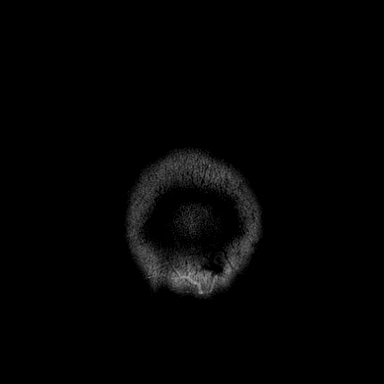
[im 29/29]
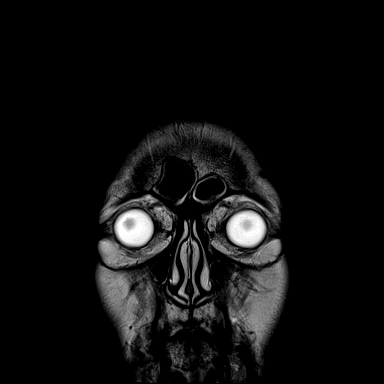

[46 of 48 positions shown; findings below may reference images not displayed]

FINDINGS: Brain:

Mild generalized cerebral atrophy.

Moderate multifocal T2 FLAIR hyperintense signal abnormality within
the cerebral white matter, nonspecific but compatible with chronic
small vessel ischemic disease.

Punctate chronic microhemorrhage within the posterior left
cerebellar hemisphere.

There is no acute infarct.

No evidence of an intracranial mass.

No extra-axial fluid collection.

No midline shift.

Vascular: Maintained flow voids within the proximal large arterial
vessels.

Skull and upper cervical spine: No focal suspicious marrow lesion.

Sinuses/Orbits: Visualized orbits show no acute finding. Mild
mucosal thickening within the bilateral frontal sinuses.
Mild-to-moderate mucosal thickening within the bilateral ethmoid
sinuses. Extensive partial opacification of the left sphenoid sinus.
Trace mucosal thickening within the right sphenoid sinus. Mild
mucosal thickening within the bilateral maxillary sinuses.

Other: Trace fluid within the bilateral mastoid air cells.
IMPRESSION: No evidence of acute intracranial abnormality.

Moderate chronic small vessel ischemic changes within the cerebral
white matter, slightly progressed from the brain MRI of 10/25/2016.

Mild generalized cerebral atrophy.

Paranasal sinus disease, as described.

## 2024-01-03 ENCOUNTER — Other Ambulatory Visit
Admission: RE | Admit: 2024-01-03 | Discharge: 2024-01-03 | Disposition: A | Discharge status: Home / Self Care | Source: Ambulatory Visit | Attending: Internal Medicine | Admitting: Internal Medicine

## 2024-01-03 DIAGNOSIS — R0602 Shortness of breath: Secondary | ICD-10-CM | POA: Insufficient documentation

## 2024-01-03 LAB — BRAIN NATRIURETIC PEPTIDE: B Natriuretic Peptide: 50.8 pg/mL (ref 0.0–100.0)

## 2024-01-24 ENCOUNTER — Other Ambulatory Visit: Payer: Self-pay

## 2024-01-24 ENCOUNTER — Emergency Department
Admission: EM | Admit: 2024-01-24 | Discharge: 2024-01-24 | Disposition: A | Discharge status: Home / Self Care | Attending: Emergency Medicine | Admitting: Emergency Medicine

## 2024-01-24 ENCOUNTER — Emergency Department

## 2024-01-24 DIAGNOSIS — J449 Chronic obstructive pulmonary disease, unspecified: Secondary | ICD-10-CM | POA: Insufficient documentation

## 2024-01-24 DIAGNOSIS — L03031 Cellulitis of right toe: Secondary | ICD-10-CM | POA: Diagnosis not present

## 2024-01-24 DIAGNOSIS — E119 Type 2 diabetes mellitus without complications: Secondary | ICD-10-CM | POA: Diagnosis not present

## 2024-01-24 DIAGNOSIS — M79674 Pain in right toe(s): Secondary | ICD-10-CM | POA: Diagnosis present

## 2024-01-24 DIAGNOSIS — I2510 Atherosclerotic heart disease of native coronary artery without angina pectoris: Secondary | ICD-10-CM | POA: Insufficient documentation

## 2024-01-24 DIAGNOSIS — Z79899 Other long term (current) drug therapy: Secondary | ICD-10-CM | POA: Insufficient documentation

## 2024-01-24 LAB — COMPREHENSIVE METABOLIC PANEL WITH GFR
ALT: 17 U/L (ref 0–44)
AST: 22 U/L (ref 15–41)
Albumin: 4.2 g/dL (ref 3.5–5.0)
Alkaline Phosphatase: 69 U/L (ref 38–126)
Anion gap: 13 (ref 5–15)
BUN: 28 mg/dL — ABNORMAL HIGH (ref 8–23)
CO2: 23 mmol/L (ref 22–32)
Calcium: 9.6 mg/dL (ref 8.9–10.3)
Chloride: 102 mmol/L (ref 98–111)
Creatinine, Ser: 1.08 mg/dL (ref 0.61–1.24)
GFR, Estimated: >60 mL/min (ref >60)
Glucose, Bld: 154 mg/dL — ABNORMAL HIGH (ref 70–99)
Potassium: 3.2 mmol/L — ABNORMAL LOW (ref 3.5–5.1)
Sodium: 138 mmol/L (ref 135–145)
Total Bilirubin: 0.9 mg/dL (ref 0.0–1.2)
Total Protein: 7 g/dL (ref 6.5–8.1)

## 2024-01-24 LAB — CBC
HCT: 45.9 % (ref 39.0–52.0)
Hemoglobin: 15.2 g/dL (ref 13.0–17.0)
MCH: 27.9 pg (ref 26.0–34.0)
MCHC: 33.1 g/dL (ref 30.0–36.0)
MCV: 84.2 fL (ref 80.0–100.0)
Platelets: 238 10*3/uL (ref 150–400)
RBC: 5.45 MIL/uL (ref 4.22–5.81)
RDW: 13.7 % (ref 11.5–15.5)
WBC: 10.1 10*3/uL (ref 4.0–10.5)
nRBC: 0 % (ref 0.0–0.2)

## 2024-01-24 LAB — LACTIC ACID, PLASMA
Lactic Acid, Venous: 2.5 mmol/L (ref 0.5–1.9)
Lactic Acid, Venous: 1.8 mmol/L (ref 0.5–1.9)

## 2024-01-24 MED ORDER — SODIUM CHLORIDE 0.9 % IV BOLUS
500.0000 mL | Freq: Once | INTRAVENOUS | Status: AC
  Administered 2024-01-24: 500 mL via INTRAVENOUS

## 2024-01-24 MED ORDER — CEPHALEXIN 500 MG PO CAPS: 500.0000 mg | ORAL_CAPSULE | Freq: Three times a day (TID) | ORAL | 0 refills | Status: AC

## 2024-01-24 NOTE — ED Notes (Signed)
 Pt in xray. EDP into speak with family.

## 2024-01-24 NOTE — ED Provider Notes (Signed)
 [facility] Provider Note    Event Date/Time   First MD Initiated Contact with Patient 01/24/24 1326     (approximate)   History   Hypotension   HPI  JAKARIUS FLAMENCO is a 83 y.o. male with history of diabetes, COPD on 2 L at nighttime, coronary disease who comes in with concerns for right foot infection.  Patient has had progressively worsening right big toe redness and pain.  Blood pressure was noted to be 97/57 at the clinic so patient was sent here for evaluation.  According to family he has had no prior history of foot infections he is not on any current antibiotics.  Denies stepping on anything.  He denies any history of gout.  I reviewed a note from Dr. Gilda Crease from the vascular center where patient did not have any significant vascular issues.  Patient is on spironolactone, metoprolol, losartan, hydrochlorothiazide for blood pressure.  I reviewed a note from 01/03/2024 where patient's oxygen was 90%  Physical Exam   Triage Vital Signs: ED Triage Vitals  Encounter Vitals Group     BP 01/24/24 1257 (!) 109/57     Systolic BP Percentile --      Diastolic BP Percentile --      Pulse Rate 01/24/24 1257 74     Resp 01/24/24 1257 18     Temp 01/24/24 1257 97.9 F (36.6 C)     Temp src --      SpO2 01/24/24 1257 90 %     Weight 01/24/24 1259 214 lb 1.1 oz (97.1 kg)     Height 01/24/24 1259 6' (1.829 m)     Head Circumference --      Peak Flow --      Pain Score 01/24/24 1258 8     Pain Loc --      Pain Education --      Exclude from Growth Chart --     Most recent vital signs: Vitals:   01/24/24 1257  BP: (!) 109/57  Pulse: 74  Resp: 18  Temp: 97.9 F (36.6 C)  SpO2: 90%     General: Awake, no distress.  CV:  Good peripheral perfusion.  Resp:  Normal effort.  Abd:  No distention.  Other:  Patient has some minimal swelling noted to the right toe.  No open wounds no ulcerations no involvement of the nail.  There is some redness noted in  this area.   ED Results / Procedures / Treatments   Labs (all labs ordered are listed, but only abnormal results are displayed) Labs Reviewed  CBC  COMPREHENSIVE METABOLIC PANEL WITH GFR  LACTIC ACID, PLASMA  LACTIC ACID, PLASMA      RADIOLOGY I have reviewed the xray personally and interpreted no evidence of any fracture   PROCEDURES:  Critical Care performed: No  .1-3 Lead EKG Interpretation  Performed by: Concha Se, MD Authorized by: Concha Se, MD     Interpretation: normal     ECG rate:  60   ECG rate assessment: normal     Rhythm: sinus rhythm     Ectopy: none     Conduction: normal      MEDICATIONS ORDERED IN ED: Medications  sodium chloride 0.9 % bolus 500 mL (0 mLs Intravenous Stopped 01/24/24 1435)     IMPRESSION / MDM / ASSESSMENT AND PLAN / ED COURSE  I reviewed the triage vital signs and the nursing notes.   Patient's presentation is  most consistent with acute presentation with potential threat to life or bodily function.   Patient comes in with concerns for right toe redness.  Good distal pulse unlikely arterial issue.  No swelling in the leg to suggest DVT.  Looks most consistent with cellulitis versus gout.  Patient able to move the toe unlikely septic joint.  Patient very well-appearing.  Slightly low blood pressures but he is on multiple diuretics.  Patient given some gentle hydration given elevated lactate and elevated BUN I suspect that he is slightly dehydrated.  Repeat lactate is downtrending, blood pressure improving and patient reports feeling better.  Patient was given a dose of ceftriaxone.  His CBC shows normal white count.  We discussed covering for cellulitis with Keflex he expressed understanding will follow-up outpatient with podiatry.  I also instructed him to use a low dose ibuprofen in case this could be gout but seems less likely.  We discussed his diuretics and holding 1 dose tomorrow.  We discussed his abnormal oxygen level  of 90-93% but he states that is baseline for him and a good reading for him.  Denies any respiratory issues   The patient is on the cardiac monitor to evaluate for evidence of arrhythmia and/or significant heart rate changes.      FINAL CLINICAL IMPRESSION(S) / ED DIAGNOSES   Final diagnoses:  Cellulitis of toe of right foot     Rx / DC Orders   ED Discharge Orders          Ordered    cephALEXin (KEFLEX) 500 MG capsule  3 times daily        01/24/24 1531             Note:  This document was prepared using Dragon voice recognition software and may include unintentional dictation errors.   Lubertha Rush, MD 01/24/24 [phone]

## 2024-01-24 NOTE — ED Notes (Signed)
 EDP at [facility]

## 2024-01-24 NOTE — ED Notes (Signed)
 Xray called to bring back to room after scan

## 2024-01-24 NOTE — Discharge Instructions (Addendum)
 Take antibiotics for some infection of the toe. Take ibuprofen 400 every 8 hours with food to help with any inflammation for the next 3 days.  Call the podiatrist to make a follow-up appointment and return to the ER if develop fevers, worsening symptoms or any other concerns  If your blood pressures are low tomorrow hold 1 dose of your Lasix.  However if you develop swelling or shortness of breath and do not hold the Lasix.

## 2024-01-24 NOTE — ED Triage Notes (Addendum)
 Pt here from [facility] with hypotension and a possible infx in his great toe on the right foot x3 days. Wife states the area on his toe is red and is now traveling up his foot. Pt denies fever, NVD.

## 2024-01-24 NOTE — ED Triage Notes (Signed)
 First Nurse Note: Patient to ED from [facility] for hypotension- 97/57. Initially there for right toe pain- noted to be red per KC.

## 2024-01-24 NOTE — ED Notes (Signed)
 Back from xray, alert, NAD, calm, interactive. Family at [facility].

## 2024-01-29 LAB — CULTURE, BLOOD (ROUTINE X 2)
Culture: NO GROWTH
Culture: NO GROWTH
Special Requests: ADEQUATE
Special Requests: ADEQUATE

## 2024-07-12 ENCOUNTER — Other Ambulatory Visit: Payer: Self-pay

## 2024-07-12 ENCOUNTER — Emergency Department
Admission: EM | Admit: 2024-07-12 | Discharge: 2024-07-12 | Disposition: A | Discharge status: Home / Self Care | Attending: Emergency Medicine | Admitting: Emergency Medicine

## 2024-07-12 DIAGNOSIS — E119 Type 2 diabetes mellitus without complications: Secondary | ICD-10-CM | POA: Diagnosis not present

## 2024-07-12 DIAGNOSIS — I2510 Atherosclerotic heart disease of native coronary artery without angina pectoris: Secondary | ICD-10-CM | POA: Insufficient documentation

## 2024-07-12 DIAGNOSIS — I10 Essential (primary) hypertension: Secondary | ICD-10-CM | POA: Diagnosis not present

## 2024-07-12 DIAGNOSIS — M79641 Pain in right hand: Secondary | ICD-10-CM | POA: Insufficient documentation

## 2024-07-12 DIAGNOSIS — M79642 Pain in left hand: Secondary | ICD-10-CM | POA: Diagnosis not present

## 2024-07-12 DIAGNOSIS — J449 Chronic obstructive pulmonary disease, unspecified: Secondary | ICD-10-CM | POA: Diagnosis not present

## 2024-07-12 DIAGNOSIS — R202 Paresthesia of skin: Secondary | ICD-10-CM | POA: Diagnosis not present

## 2024-07-12 MED ORDER — TRAMADOL HCL 50 MG PO TABS: 50.0000 mg | ORAL_TABLET | Freq: Three times a day (TID) | ORAL | 0 refills | Status: AC | PRN

## 2024-07-12 MED ORDER — TRAMADOL HCL 50 MG PO TABS
50.0000 mg | ORAL_TABLET | Freq: Once | ORAL | Status: AC
  Administered 2024-07-12: 50 mg via ORAL
  Filled 2024-07-12: qty 1

## 2024-07-12 NOTE — Discharge Instructions (Signed)
 Your exam is normal and reassuring.  Your symptoms may be due to peripheral neuropathy including diabetic neuropathy versus carpal tunnel.  You also may have some underlying arthritis.  Take the prescription pain medicine as needed. You should follow-up with orthopedics as scheduled.

## 2024-07-12 NOTE — ED Provider Notes (Signed)
    [facility] Emergency Department Provider Note     Event Date/Time   First MD Initiated Contact with Patient 07/12/24 2224     (approximate)   History   Hand Pain   HPI  Jose Ray is a 83 y.o. male ***       Physical Exam   Triage Vital Signs: ED Triage Vitals  Encounter Vitals Group     BP 07/12/24 2211 (!) 145/62     Girls Systolic BP Percentile --      Girls Diastolic BP Percentile --      Boys Systolic BP Percentile --      Boys Diastolic BP Percentile --      Pulse Rate 07/12/24 2211 78     Resp 07/12/24 2211 18     Temp 07/12/24 2211 97.8 F (36.6 C)     Temp Source 07/12/24 2211 Oral     SpO2 07/12/24 2211 (!) 89 %     Weight 07/12/24 2209 188 lb (85.3 kg)     Height 07/12/24 2209 6' (1.829 m)     Head Circumference --      Peak Flow --      Pain Score 07/12/24 2208 3     Pain Loc --      Pain Education --      Exclude from Growth Chart --     Most recent vital signs: Vitals:   07/12/24 2211  BP: (!) 145/62  Pulse: 78  Resp: 18  Temp: 97.8 F (36.6 C)  SpO2: (!) 89%    General Awake, no distress. *** {**HEENT NCAT. PERRL. EOMI. No rhinorrhea. Mucous membranes are moist. **} CV:  Good peripheral perfusion. *** RESP:  Normal effort. *** ABD:  No distention. *** {**Other: **}   ED Results / Procedures / Treatments   Labs (all labs ordered are listed, but only abnormal results are displayed) Labs Reviewed - No data to display   EKG  ***  RADIOLOGY  {**I personally viewed and evaluated these images as part of my medical decision making, as well as reviewing the written report by the radiologist.  ED Provider Interpretation: ***  No results found.   PROCEDURES:  Critical Care performed: {CriticalCareYesNo:19197::Yes, see critical care procedure note(s),No}  Procedures   MEDICATIONS ORDERED IN ED: Medications - No data to display   IMPRESSION / MDM / ASSESSMENT AND PLAN / ED COURSE   I reviewed the triage vital signs and the nursing notes.                              Differential diagnosis includes, but is not limited to, ***  Patient's presentation is most consistent with {EM COPA:27473}  {**The patient is on the cardiac monitor to evaluate for evidence of arrhythmia and/or significant heart rate changes.**}  Patient's diagnosis is consistent with ***. Patient will be discharged home with prescriptions for ***. Patient is to follow up with *** as needed or otherwise directed. Patient is given ED precautions to return to the ED for any worsening or new symptoms.     FINAL CLINICAL IMPRESSION(S) / ED DIAGNOSES   Final diagnoses:  None     Rx / DC Orders   ED Discharge Orders     None        Note:  This document was prepared using Dragon voice recognition software and may include unintentional dictation errors.

## 2024-07-12 NOTE — ED Triage Notes (Addendum)
 Patient C/O left-handed finger pain and numbness that began a month ago that has worsened. Patient also states that it is also now starting to happen in the right hand as well. He has an appointment tomorrow with an ortho Dr., but wanted to be seen tonight since the pain was getting worse. Patient describes the pain as being deep inside.

## 2024-07-12 NOTE — ED Notes (Signed)
 Placed patient on 2L nasal cannula. Patient states he uses O2 PRN.

## 2024-08-16 ENCOUNTER — Other Ambulatory Visit: Payer: Self-pay | Admitting: Internal Medicine

## 2024-08-16 ENCOUNTER — Ambulatory Visit: Admission: EM | Admit: 2024-08-16 | Discharge: 2024-08-16 | Disposition: A | Discharge status: Home / Self Care

## 2024-08-16 DIAGNOSIS — M25511 Pain in right shoulder: Secondary | ICD-10-CM | POA: Diagnosis not present

## 2024-08-16 DIAGNOSIS — M25512 Pain in left shoulder: Secondary | ICD-10-CM

## 2024-08-16 DIAGNOSIS — L03213 Periorbital cellulitis: Secondary | ICD-10-CM | POA: Diagnosis not present

## 2024-08-16 DIAGNOSIS — R202 Paresthesia of skin: Secondary | ICD-10-CM | POA: Diagnosis not present

## 2024-08-16 DIAGNOSIS — M79642 Pain in left hand: Secondary | ICD-10-CM

## 2024-08-16 DIAGNOSIS — M79641 Pain in right hand: Secondary | ICD-10-CM

## 2024-08-16 DIAGNOSIS — I5022 Chronic systolic (congestive) heart failure: Secondary | ICD-10-CM

## 2024-08-16 MED ORDER — CEPHALEXIN 500 MG PO CAPS: 500.0000 mg | ORAL_CAPSULE | Freq: Three times a day (TID) | ORAL | 0 refills | Status: AC

## 2024-08-16 NOTE — Discharge Instructions (Addendum)
 Continue taking your gabapentin as directed.  Follow-up with your primary care provider or neurologist tomorrow.  Go to the emergency department if you have worsening symptoms.    Take the antibiotic cephalexin  as directed for your eye redness.  Follow-up with your primary care provider or eye care provider.  Go to the emergency department if you have worsening symptoms.

## 2024-08-16 NOTE — ED Provider Notes (Signed)
 Jose Ray    CSN: [id number] Arrival date & time: 08/16/24  1529      History   Chief Complaint Chief Complaint  Patient presents with   Joint Pain    HPI Jose Ray is a 83 y.o. male.  Accompanied by his daughter, patient presents with several month history of bilateral hand pain, paresthesias in both hands, bilateral shoulder pain, bilateral weak handgrips.  Patient has been seen several times in the last month for his symptoms.  He was seen at [facility] ED on 07/12/2024; diagnosed with right hand pain, left hand pain, paresthesia of hand; treated with tramadol.  He was seen by his PCP on 07/24/2024.  On 08/09/2024, he had a procedure done at [facility] neurology; his daughter believes this was a steroid injection but is unsure if it was an a shoulder or hands.  He had a telemedicine visit on 08/12/2024 and his gabapentin was increased.    Patient also presents with several week history of bilateral lower eyelid redness.  His daughter reports that his wife has been treating this with erythromycin ointment and, while it has improved, it has not resolved.  He has had some tearing but no purulent drainage.  The history is provided by the patient, a relative and medical records.    Past Medical History:  Diagnosis Date   Acute MI (HCC)    CHF (congestive heart failure) (HCC)    COPD (chronic obstructive pulmonary disease) (HCC)    Coronary artery disease    Diabetes mellitus without complication (HCC)    GERD (gastroesophageal reflux disease)    History of hiatal hernia    Hypertension     Patient Active Problem List   Diagnosis Date Noted   PAD (peripheral artery disease) 02/10/2023   COPD (chronic obstructive pulmonary disease) (HCC) 02/10/2023   Syncope and collapse 10/24/2016   Diabetes (HCC) 10/24/2016   CAD (coronary artery disease) 10/24/2016   HTN (hypertension) 10/24/2016    Past Surgical History:  Procedure Laterality Date   CORONARY ANGIOPLASTY  WITH STENT PLACEMENT     CYSTOSCOPY WITH INSERTION OF UROLIFT N/A 08/08/2018   Procedure: CYSTOSCOPY WITH INSERTION OF UROLIFT;  Surgeon: Kassie Ozell SAUNDERS, MD;  Location: [facility] ORS;  Service: Urology;  Laterality: N/A;   HERNIA REPAIR N/A    LEFT HEART CATH AND CORONARY ANGIOGRAPHY Left 02/03/2021   Procedure: LEFT HEART CATH AND CORONARY ANGIOGRAPHY;  Surgeon: Hester Wolm PARAS, MD;  Location: [facility] INVASIVE CV LAB;  Service: Cardiovascular;  Laterality: Left;       Home Medications    Prior to Admission medications   Medication Sig Start Date End Date Taking? Authorizing Provider  albuterol (VENTOLIN HFA) 108 (90 Base) MCG/ACT inhaler INHALE 2 PUFFS INTO LUNGS EVERY 6 HOURS AS NEEDED 08/01/24  Yes [provider]  aspirin  81 MG tablet Take 81 mg by mouth at bedtime.   Yes [provider]  cephALEXin  (KEFLEX ) 500 MG capsule Take 1 capsule (500 mg total) by mouth 3 (three) times daily for 7 days. 08/16/24 08/23/24 Yes Corlis Burnard DEL, NP  Cholecalciferol 25 MCG (1000 UT) capsule Take by mouth.   Yes [provider]  cyanocobalamin 1000 MCG tablet Take by mouth.   Yes [provider]  Ensifentrine  (OHTUVAYRE ) 3 MG/2.5ML SUSP Inhale into the lungs.   Yes [provider]  erythromycin ophthalmic ointment SMARTSIG:In Eye(s) 08/01/24  Yes [provider]  escitalopram (LEXAPRO) 5 MG tablet Take 5 mg by mouth  every morning. 01/19/22  Yes [provider]  FARXIGA 10 MG TABS tablet Take 10 mg by mouth daily.   Yes [provider]  fexofenadine (ALLEGRA) 180 MG tablet Take 180 mg by mouth at bedtime.   Yes [provider]  furosemide (LASIX) 20 MG tablet Take 20 mg by mouth daily.   Yes [provider]  gabapentin (NEURONTIN) 300 MG capsule Take 300 mg by mouth 2 (two) times daily. 08/12/24  Yes [provider]  glipiZIDE (GLUCOTROL) 10 MG tablet Take 10 mg by mouth daily before breakfast. 01/15/21  Yes  [provider]  hydrochlorothiazide  (HYDRODIURIL ) 25 MG tablet Take 25 mg by mouth daily.   Yes [provider]  Multiple Vitamins-Minerals (PRESERVISION AREDS 2) CAPS Take 1 capsule by mouth at bedtime.   Yes [provider]  pantoprazole  (PROTONIX ) 40 MG tablet Take 40 mg by mouth daily.   Yes [provider]  rosuvastatin (CRESTOR) 20 MG tablet Take 20 mg by mouth every other day. 06/02/23  Yes [provider]  sacubitril-valsartan (ENTRESTO) 24-26 MG SMARTSIG:1 Tablet(s) By Mouth Every 12 Hours   Yes [provider]  spironolactone (ALDACTONE) 25 MG tablet Take 12.5 mg by mouth daily.   Yes [provider]  spironolactone (ALDACTONE) 25 MG tablet Take 12.5 mg by mouth daily. 04/25/23  Yes [provider]  tiotropium (SPIRIVA ) 18 MCG inhalation capsule Place 18 mcg into inhaler and inhale daily.   Yes [provider]  DOMINIC BECK 100-62.5-25 MCG/ACT AEPB SMARTSIG:1 inhalation Via Inhaler Daily 02/05/22  Yes [provider]  atorvastatin  (LIPITOR) 80 MG tablet Take 80 mg by mouth at bedtime. Patient not taking: Reported on 03/23/2021    [provider]  atorvastatin  (LIPITOR) 80 MG tablet Take 1 tablet by mouth at bedtime. Patient not taking: Reported on 02/08/2022 02/24/21   [provider]  chlorhexidine (PERIDEX) 0.12 % solution Use as directed 5 mLs in the mouth or throat 3 (three) times daily. Patient not taking: Reported on 08/16/2024 09/22/22   [provider]  clindamycin (CLEOCIN) 300 MG capsule Take 300 mg by mouth 3 (three) times daily. Patient not taking: Reported on 08/16/2024 12/09/22   [provider]  famotidine (PEPCID) 10 MG tablet Take 10 mg by mouth daily. Patient not taking: Reported on 08/16/2024    [provider]  fluticasone (FLONASE) 50 MCG/ACT nasal spray Place 2 sprays into both nostrils at bedtime. Patient not taking: Reported on 02/08/2022     [provider]  fluticasone (FLONASE) 50 MCG/ACT nasal spray Place 2 sprays into both nostrils daily. Patient not taking: Reported on 08/16/2024 10/16/21   [provider]  fluticasone-salmeterol (ADVAIR) 250-50 MCG/ACT AEPB Advair Diskus 250 mcg-50 mcg/dose powder for inhalation Patient not taking: Reported on 02/08/2022    [provider]  Fluticasone-Salmeterol (ADVAIR) 250-50 MCG/DOSE AEPB Inhale 1 puff into the lungs 2 (two) times daily.    [provider]  hydrochlorothiazide  (HYDRODIURIL ) 12.5 MG tablet Take 12.5 mg by mouth daily. Patient not taking: Reported on 08/16/2024    [provider]  hydrochlorothiazide  (HYDRODIURIL ) 25 MG tablet Take 1 tablet by mouth daily. Patient not taking: Reported on 03/23/2021 01/27/21   [provider]  icosapent Ethyl (VASCEPA) 1 g capsule Take 2 g by mouth 2 (two) times daily. Patient not taking: Reported on 08/16/2024    [provider]  isosorbide mononitrate (IMDUR) 30 MG 24 hr tablet Take 30 mg by mouth daily. Patient  not taking: Reported on 02/08/2022    [provider]  isosorbide mononitrate (IMDUR) 30 MG 24 hr tablet Take 1 tablet by mouth daily. 01/19/22   [provider]  losartan  (COZAAR ) 100 MG tablet Take 1 tablet by mouth daily. 01/20/22   [provider]  losartan  (COZAAR ) 50 MG tablet Take 100 mg by mouth daily. Patient not taking: Reported on 02/08/2022    [provider]  metFORMIN (GLUCOPHAGE) 1000 MG tablet Take 1,000 mg by mouth 2 (two) times daily.    [provider]  metoprolol  succinate (TOPROL -XL) 100 MG 24 hr tablet Take 50 mg by mouth daily. Take with or immediately following a meal. Patient not taking: Reported on 03/23/2021    [provider]  metoprolol  succinate (TOPROL -XL) 25 MG 24 hr tablet Take 1 tablet by mouth daily. 03/05/21 03/05/22  [provider]  metoprolol  succinate (TOPROL -XL) 50 MG 24 hr tablet  Take 50 mg by mouth daily.    [provider]  OXYGEN Inhale 2.5-3 L into the lungs See admin instructions. Used while sleeping and physical activity    [provider]  Polyethyl Glycol-Propyl Glycol 0.4-0.3 % SOLN Place 1 drop into both eyes See admin instructions. Instill 1 drop into both eyes at night, may use during the day as needed for dry eyes    [provider]  rosuvastatin (CRESTOR) 10 MG tablet Take 10 mg by mouth every other day. Patient not taking: Reported on 08/16/2024 02/04/22   [provider]    Family History Family History  Problem Relation Age of Onset   Stomach cancer Mother    Coronary artery disease Father     Social History Social History   Tobacco Use   Smoking status: Former    Current packs/day: 0.00    Average packs/day: 1 pack/day for 35.0 years (35.0 ttl pk-yrs)    Types: Cigarettes    Start date: 36    Quit date: 1995    Years since quitting: 30.8   Smokeless tobacco: Never  Vaping Use   Vaping status: Never Used  Substance Use Topics   Alcohol use: No   Drug use: No     Allergies   Omega-3 fatty acids, Shrimp [shellfish allergy], and Penicillins   Review of Systems Review of Systems  Constitutional:  Negative for chills and fever.  Eyes:  Positive for redness. Negative for discharge and visual disturbance.  Respiratory:  Negative for cough and shortness of breath.   Cardiovascular:  Negative for chest pain and palpitations.  Musculoskeletal:  Positive for arthralgias. Negative for joint swelling.     Physical Exam Triage Vital Signs ED Triage Vitals  Encounter Vitals Group     BP 08/16/24 1558 116/65     Girls Systolic BP Percentile --      Girls Diastolic BP Percentile --      Boys Systolic BP Percentile --      Boys Diastolic BP Percentile --      Pulse Rate 08/16/24 1558 79     Resp 08/16/24 1558 18     Temp 08/16/24 1558 97.8 F (36.6 C)     Temp src --      SpO2 08/16/24 1558 90 %      Weight --      Height --      Head Circumference --      Peak Flow --      Pain Score 08/16/24 1601 5  Pain Loc --      Pain Education --      Exclude from Growth Chart --    No data found.  Updated Vital Signs BP 116/65   Pulse 79   Temp 97.8 F (36.6 C)   Resp 18   SpO2 95%   Visual Acuity Right Eye Distance:   Left Eye Distance:   Bilateral Distance:    Right Eye Near:   Left Eye Near:    Bilateral Near:     Physical Exam Constitutional:      General: He is not in acute distress. Eyes:     General:        Right eye: No discharge.        Left eye: No discharge.     Conjunctiva/sclera: Conjunctivae normal.     Pupils: Pupils are equal, round, and reactive to light.     Comments: Bilateral lower eyelid erythema.  Cardiovascular:     Rate and Rhythm: Normal rate.  Pulmonary:     Effort: Pulmonary effort is normal. No respiratory distress.  Musculoskeletal:        General: Tenderness present. No swelling or deformity.     Comments: Bilateral decreased strength of handgrip in fingers but good strength in both thumbs.  F ROM of both wrists and elbows.  Decreased ROM and mild generalized tenderness of both shoulders.  No focal tenderness.  No erythema, ecchymosis, edema, wounds.  2+ radial pulses, sensation intact, upper extremity strength 5/5 bilaterally other than handgrip.  Skin:    General: Skin is warm and dry.     Findings: No bruising, erythema, lesion or rash.  Neurological:     General: No focal deficit present.     Mental Status: He is alert.      UC Treatments / Results  Labs (all labs ordered are listed, but only abnormal results are displayed) Labs Reviewed - No data to display  EKG   Radiology No results found.  Procedures Procedures (including critical care time)  Medications Ordered in UC Medications - No data to display  Initial Impression / Assessment and Plan / UC Course  I have reviewed the triage vital signs and the  nursing notes.  Pertinent labs & imaging results that were available during my care of the patient were reviewed by me and considered in my medical decision making (see chart for details).    Bilateral hand pain and paresthesias, bilateral shoulder pain, preseptal cellulitis.  Afebrile and vital signs are stable.  Discussed limitations of evaluation and treatment in an urgent care setting for patient's ongoing and seemingly chronic paresthesias and pain in his hands and shoulders.  His gabapentin was increased 4 days ago.  He had tramadol prescribed in the ED a month ago and still has some of this left which he took today.  Instructed patient and his daughter to continue taking the increased gabapentin as prescribed 4 days ago.  Instructed them to follow-up with his PCP or neurologist tomorrow.  ED precautions given if he has uncontrolled pain or worsening symptoms.  Treating his eye symptoms with 7-day course of cephalexin .  Instructed patient and his daughter to follow-up with his PCP or eye care provider.  ED precautions given.  Education provided today on hand pain, shoulder pain, paresthesias, preseptal cellulitis.  They agree to plan of care.  Final Clinical Impressions(s) / UC Diagnoses   Final diagnoses:  Bilateral hand pain  Paresthesias  Bilateral shoulder pain, unspecified chronicity  Preseptal  cellulitis     Discharge Instructions      Continue taking your gabapentin as directed.  Follow-up with your primary care provider or neurologist tomorrow.  Go to the emergency department if you have worsening symptoms.    Take the antibiotic cephalexin  as directed for your eye redness.  Follow-up with your primary care provider or eye care provider.  Go to the emergency department if you have worsening symptoms.     ED Prescriptions     Medication Sig Dispense Auth. Provider   cephALEXin  (KEFLEX ) 500 MG capsule Take 1 capsule (500 mg total) by mouth 3 (three) times daily for 7 days.  21 capsule Corlis Burnard DEL, NP      I have reviewed the PDMP during this encounter.   Corlis Burnard DEL, NP 08/16/24 1704

## 2024-08-16 NOTE — ED Triage Notes (Addendum)
 Patient to Urgent Care with complaints of bilateral hand numbness and pain (difficulty grasping/ closing hands).  Symptoms have worsened over x2 weeks. Feels like the pain is hurting his arms and shoulders. No otc meds. Currently taking gabapentin (1-2 doses). Tramadol today.   Bilateral lower eyelid redness x2-3 weeks. Using erythromycin ointment.

## 2024-08-22 ENCOUNTER — Other Ambulatory Visit: Payer: Self-pay | Admitting: Internal Medicine

## 2024-08-22 ENCOUNTER — Ambulatory Visit
Admission: RE | Admit: 2024-08-22 | Discharge: 2024-08-22 | Disposition: A | Discharge status: Home / Self Care | Source: Ambulatory Visit | Attending: Internal Medicine | Admitting: Internal Medicine

## 2024-08-22 DIAGNOSIS — I5022 Chronic systolic (congestive) heart failure: Secondary | ICD-10-CM | POA: Insufficient documentation

## 2024-08-22 MED ORDER — GADOBUTROL 1 MMOL/ML IV SOLN
11.0000 mL | Freq: Once | INTRAVENOUS | Status: AC | PRN
  Administered 2024-08-22: 11 mL via INTRAVENOUS

## 2024-08-22 NOTE — Therapy (Signed)
 OUTPATIENT OCCUPATIONAL THERAPY ORTHO EVALUATION  Patient Name: Jose Ray MRN: [id number] DOB:1941/09/02, 83 y.o., male Today's Date: 08/23/2024  PCP: Dr Alla REFERRING PROVIDER: Dr Ezra  END OF SESSION:  OT End of Session - 08/23/24 1032     Visit Number 1    Number of Visits 12    Date for Recertification  10/18/24    OT Start Time 1033    OT Stop Time 1115    OT Time Calculation (min) 42 min    Activity Tolerance Patient tolerated treatment well    Behavior During Therapy WFL for tasks assessed/performed          Past Medical History:  Diagnosis Date   Acute MI (HCC)    CHF (congestive heart failure) (HCC)    COPD (chronic obstructive pulmonary disease) (HCC)    Coronary artery disease    Diabetes mellitus without complication (HCC)    GERD (gastroesophageal reflux disease)    History of hiatal hernia    Hypertension    Past Surgical History:  Procedure Laterality Date   CORONARY ANGIOPLASTY WITH STENT PLACEMENT     CYSTOSCOPY WITH INSERTION OF UROLIFT N/A 08/08/2018   Procedure: CYSTOSCOPY WITH INSERTION OF UROLIFT;  Surgeon: Kassie Ozell SAUNDERS, MD;  Location: [facility];  Service: Urology;  Laterality: N/A;   HERNIA REPAIR N/A    LEFT HEART CATH AND CORONARY ANGIOGRAPHY Left 02/03/2021   Procedure: LEFT HEART CATH AND CORONARY ANGIOGRAPHY;  Surgeon: Hester Wolm PARAS, MD;  Location: [facility];  Service: Cardiovascular;  Laterality: Left;   Patient Active Problem List   Diagnosis Date Noted   PAD (peripheral artery disease) 02/10/2023   COPD (chronic obstructive pulmonary disease) (HCC) 02/10/2023   Syncope and collapse 10/24/2016   Diabetes (HCC) 10/24/2016   CAD (coronary artery disease) 10/24/2016   HTN (hypertension) 10/24/2016    ONSET DATE: Jul 25 2024  REFERRING DIAG: bilateral CT and trigger fingers  THERAPY DIAG:  Carpal tunnel syndrome, bilateral upper limbs  Muscle weakness (generalized)  Rationale for Evaluation and  Treatment: Rehabilitation  SUBJECTIVE:   SUBJECTIVE STATEMENT: My hands are numb but if I start using them they get painful, right worse than left (dominant).  Pt accompanied by: self and daughter in law  PERTINENT HISTORY: Ortho noteAssessment & Plan:  Bilateral carpal tunnel syndrome - His recent nerve study was positive for bilateral carpal tunnel syndrome addition to a generalized peripheral neuropathy picture - He seemed to have a positive response to the left-sided carpal tunnel injection, even though he continues to complain of left-sided hand pain - We will try a right sided carpal tunnel injection today to see if he feels like this gives him the same degree of relief - He seems relatively reluctant to undergo any type of surgical intervention but may be reasonable to consider doing carpal tunnel release that should he also have a positive response to this injection  Left middle finger, index finger trigger finger - These seem to be resolved without any significant A1 pulley tenderness and no triggering on exam - Sometimes trigger fingers can be a cause of generalized/vague finger pain, however, he has no other symptoms that would suggest this is the case - I am going to put a referral into occupational therapy to work with the patient on range of motion of his digits, strengthening, pain modalities - We will continue to monitor and administer other injections if necessary  He can follow-up with me again in 6 to  8 weeks   PRECAUTIONS: None    WEIGHT BEARING RESTRICTIONS: No  PAIN:  Are you having pain? Yes: NPRS scale: 1/10 Pain location: B knuckles  FALLS: Has patient fallen in last 6 months? No  LIVING ENVIRONMENT: Lives with: lives with their spouse; 24/7 home health aid for cooking and cleaning (IADLs)   PLOF: Independent ADLs, assist for IADLs  PATIENT GOALS: decrease numbness/pain  NEXT MD VISIT: 11/19 pain clinic    OBJECTIVE:  Note: Objective measures  were completed at Evaluation unless otherwise noted.  HAND DOMINANCE: Left  ADLs: Feeding self IND, increased time for buttons (avoids shirt buttons).   FUNCTIONAL OUTCOME MEASURES: TBD  UPPER EXTREMITY ROM:     Active ROM Right eval Left eval  Shoulder flexion    Shoulder abduction    Shoulder adduction    Shoulder extension    Shoulder internal rotation    Shoulder external rotation    Elbow flexion    Elbow extension    Wrist flexion 60 70  Wrist extension 50 55  Wrist ulnar deviation    Wrist radial deviation    Wrist pronation    Wrist supination    (Blank rows = not tested)  Active ROM Right eval Left eval  Thumb MCP (0-60)    Thumb IP (0-80)    Thumb Radial abd/add (0-55)     Thumb Palmar abd/add (0-45)     Thumb Opposition to Small Finger     Index MCP (0-90)     Index PIP (0-100)     Index DIP (0-70)      Long MCP (0-90)      Long PIP (0-100)      Long DIP (0-70)      Ring MCP (0-90)      Ring PIP (0-100)      Ring DIP (0-70)      Little MCP (0-90)      Little PIP (0-100)      Little DIP (0-70)      (Blank rows = not tested)  R hand unable to make fist (3rd digit 4.5cm from [facility])   HAND FUNCTION: Grip strength: Right: 15 lbs with pain; Left: 50 lbs, Lateral pinch: Right: 6 lbs, Left: 8 lbs, and 3 point pinch: Right: 6 lbs, Left: 8 lbs  COORDINATION: 9 Hole Peg test: Right: 1 min 38 sec; Left: 42 sec  SENSATION: Light touch: Impaired  R 1-3rd digits numb, worse at fingertips.   EDEMA: none  COGNITION: Overall cognitive status: Within functional limits for tasks assessed    TREATMENT DATE: 08/23/24                                                                                                                            R worse than L for pain and numbness - provided prefab wrist splint for R wrist for nighttime wear. Educated on wearing schedule and to complete skin check.  Educated on condition management  Educated on joint protection  strategies: - emphasis on avoiding squeezing fist and use of built up grips - use of larger joints to pick items up to minimize repetitive pressure on carpal tunnel   Pt/family education and video taken of HEP and for family to complete carpal spread and massage thumb webspace     HEP: 2 sets daily x 10 reps each -Wrist/extension seated over arm rest - educated on avoiding wrist flexion -Prayer stretch -Digit opposition -Tendon gliding MCP flexion, hook, and flat fist      PATIENT EDUCATION: Education details: findings of eval and HEP  Person educated: Patient Education method: Explanation, Demonstration, Tactile cues, Verbal cues, and Handouts Education comprehension: verbalized understanding, returned demonstration, verbal cues required, and needs further education   SHORT TERM GOALS: Target date: 09/20/25  Pt to trial prefab wrist splint to help improve pain and improve overall comfort for functional use of RUE.  Baseline: issued at eval Goal status: INITIAL   LONG TERM GOALS: Target date: 10/28/24   Patient will complete BUE HEP with MIN verbal cues or less from family for proper execution. Baseline: New to outpt OT Goal status: IN Progress - Tendon Gliding Exercises issued at eval.       2.  Pt will independently recall at least 3 joint protection, ergonomics, and body mechanic principles as noted in pt instructions to assist with daily tasks with increased comfort and confidence.  Baseline:  New to outpt OT Goal status: INITIAL   3.  Patient will demonstrate at least 15 lb improvement in non-dominant RUE grip strength as needed to open jars and other containers. Baseline: Right: 15 lbs Left: 50 lbs Goal status: INITIAL   4.  Pt will improve coordination skills in nondominant R hand, as seen by within functional limit score on 9 hole peg testing to have increased functional ability to carry out fine motor tasks (fasteners, etc.) and more complex, coordinated IADLs (meal  prep, sports, etc.).  Baseline: Right: 1 min 38 sec; Left: 42 sec.   Goal status: INITIAL     ASSESSMENT:  CLINICAL IMPRESSION: Patient seen today for occupational therapy evaluation for B carpal tunnel. Educated on condition management including joint protection strategies and wearing prefab wrist splint (issued) for nighttime. Video review for HEP provided with good return demonstration from family. Deficits noted in R grip strength (15 lb compared to 50 lb dominant L hand), FMC (9 hole peg R:1 min 38 sec), and R wrist flex/extension. Numbness significantly impacting dexterity on non-dominant R hand, 1-3rd digits worse. Pt will benefit from OT services for RUE strengthening, FMC, and activity tolerance in order to improve engagement during basic ADL tasks.     PERFORMANCE DEFICITS: in functional skills including ADLs, IADLs, ROM, strength, pain, flexibility, decreased knowledge of use of DME, and UE functional use,   and psychosocial skills including environmental adaptation and routines and behaviors.   IMPAIRMENTS: are limiting patient from ADLs, IADLs, rest and sleep, play, leisure, and social participation.   COMORBIDITIES: has no other co-morbidities that affects occupational performance. Patient will benefit from skilled OT to address above impairments and improve overall function.  MODIFICATION OR ASSISTANCE TO COMPLETE EVALUATION: No modification of tasks or assist necessary to complete an evaluation.  OT OCCUPATIONAL PROFILE AND HISTORY: Problem focused assessment: Including review of records relating to presenting problem.  CLINICAL DECISION MAKING: LOW - limited treatment options, no task modification necessary  REHAB POTENTIAL: Good for goals  EVALUATION COMPLEXITY: Low   PLAN:  OT FREQUENCY: 1-2x/week  OT DURATION: 8 weeks  PLANNED INTERVENTIONS: 97168 OT Re-evaluation, 97535 self care/ADL training, 02889 therapeutic exercise, 97530 therapeutic activity, 97112  neuromuscular re-education, 97140 manual therapy, 97035 ultrasound, 97018 paraffin, 02960 fluidotherapy, 97034 contrast bath, 97033 iontophoresis, passive range of motion, patient/family education, and DME and/or AE instructions    CONSULTED AND AGREED WITH PLAN OF CARE: Patient     Elston JINNY Slot, OTR/L 08/23/2024, 11:33 AM

## 2024-08-23 ENCOUNTER — Ambulatory Visit: Admitting: Occupational Therapy

## 2024-08-23 DIAGNOSIS — G5603 Carpal tunnel syndrome, bilateral upper limbs: Secondary | ICD-10-CM | POA: Insufficient documentation

## 2024-08-23 DIAGNOSIS — M6281 Muscle weakness (generalized): Secondary | ICD-10-CM | POA: Diagnosis present

## 2024-08-30 ENCOUNTER — Other Ambulatory Visit: Payer: Self-pay | Admitting: Family Medicine

## 2024-08-30 DIAGNOSIS — M5412 Radiculopathy, cervical region: Secondary | ICD-10-CM

## 2024-08-31 ENCOUNTER — Ambulatory Visit: Admitting: Occupational Therapy

## 2024-08-31 ENCOUNTER — Encounter: Payer: Self-pay | Admitting: Occupational Therapy

## 2024-08-31 DIAGNOSIS — G5603 Carpal tunnel syndrome, bilateral upper limbs: Secondary | ICD-10-CM | POA: Diagnosis not present

## 2024-08-31 DIAGNOSIS — M6281 Muscle weakness (generalized): Secondary | ICD-10-CM

## 2024-08-31 NOTE — Therapy (Signed)
 OUTPATIENT OCCUPATIONAL THERAPY ORTHO TREATMENT  Patient Name: Jose Ray MRN: [id number] DOB:1941/06/27, 83 y.o., male Today's Date: 08/31/2024  PCP: Dr Alla REFERRING PROVIDER: Dr Ezra  END OF SESSION:  OT End of Session - 08/31/24 1111     Visit Number 2    Number of Visits 12    Date for Recertification  10/18/24    OT Start Time 1115    OT Stop Time 1200    OT Time Calculation (min) 45 min    Activity Tolerance Patient tolerated treatment well    Behavior During Therapy WFL for tasks assessed/performed          Past Medical History:  Diagnosis Date   Acute MI (HCC)    CHF (congestive heart failure) (HCC)    COPD (chronic obstructive pulmonary disease) (HCC)    Coronary artery disease    Diabetes mellitus without complication (HCC)    GERD (gastroesophageal reflux disease)    History of hiatal hernia    Hypertension    Past Surgical History:  Procedure Laterality Date   CORONARY ANGIOPLASTY WITH STENT PLACEMENT     CYSTOSCOPY WITH INSERTION OF UROLIFT N/A 08/08/2018   Procedure: CYSTOSCOPY WITH INSERTION OF UROLIFT;  Surgeon: Kassie Ozell SAUNDERS, MD;  Location: [facility];  Service: Urology;  Laterality: N/A;   HERNIA REPAIR N/A    LEFT HEART CATH AND CORONARY ANGIOGRAPHY Left 02/03/2021   Procedure: LEFT HEART CATH AND CORONARY ANGIOGRAPHY;  Surgeon: Hester Wolm PARAS, MD;  Location: [facility];  Service: Cardiovascular;  Laterality: Left;   Patient Active Problem List   Diagnosis Date Noted   PAD (peripheral artery disease) 02/10/2023   COPD (chronic obstructive pulmonary disease) (HCC) 02/10/2023   Syncope and collapse 10/24/2016   Diabetes (HCC) 10/24/2016   CAD (coronary artery disease) 10/24/2016   HTN (hypertension) 10/24/2016    ONSET DATE: Jul 25 2024  REFERRING DIAG: bilateral CT and trigger fingers  THERAPY DIAG:  Carpal tunnel syndrome, bilateral upper limbs  Muscle weakness (generalized)  Rationale for Evaluation and  Treatment: Rehabilitation  SUBJECTIVE:   SUBJECTIVE STATEMENT: My numbness and pain is worse with activity, none at rest.  Pt accompanied by: self and caregiver  PERTINENT HISTORY: Ortho noteAssessment & Plan:  Bilateral carpal tunnel syndrome - His recent nerve study was positive for bilateral carpal tunnel syndrome addition to a generalized peripheral neuropathy picture - He seemed to have a positive response to the left-sided carpal tunnel injection, even though he continues to complain of left-sided hand pain - We will try a right sided carpal tunnel injection today to see if he feels like this gives him the same degree of relief - He seems relatively reluctant to undergo any type of surgical intervention but may be reasonable to consider doing carpal tunnel release that should he also have a positive response to this injection  Left middle finger, index finger trigger finger - These seem to be resolved without any significant A1 pulley tenderness and no triggering on exam - Sometimes trigger fingers can be a cause of generalized/vague finger pain, however, he has no other symptoms that would suggest this is the case - I am going to put a referral into occupational therapy to work with the patient on range of motion of his digits, strengthening, pain modalities - We will continue to monitor and administer other injections if necessary  He can follow-up with me again in 6 to 8 weeks   PRECAUTIONS: None  WEIGHT BEARING RESTRICTIONS: No  PAIN:  Are you having pain? Yes: NPRS scale: 1/10 Pain location: B knuckles  FALLS: Has patient fallen in last 6 months? No  LIVING ENVIRONMENT: Lives with: lives with their spouse; 24/7 home health aid for cooking and cleaning (IADLs)   PLOF: Independent ADLs, assist for IADLs  PATIENT GOALS: decrease numbness/pain  NEXT MD VISIT: 11/19 pain clinic    OBJECTIVE:  Note: Objective measures were completed at Evaluation unless otherwise  noted.  HAND DOMINANCE: Left  ADLs: Feeding self IND, increased time for buttons (avoids shirt buttons).   FUNCTIONAL OUTCOME MEASURES: 08/31/24: [place] Carpal Tunnel Syndrome Questionnaire: 25/55 (hand pain at daytime>nighttime)  UPPER EXTREMITY ROM:     Active ROM Right eval Left eval  Shoulder flexion    Shoulder abduction    Shoulder adduction    Shoulder extension    Shoulder internal rotation    Shoulder external rotation    Elbow flexion    Elbow extension    Wrist flexion 60 70  Wrist extension 50 55  Wrist ulnar deviation    Wrist radial deviation    Wrist pronation    Wrist supination    (Blank rows = not tested)  Active ROM Right eval Left eval  Thumb MCP (0-60)    Thumb IP (0-80)    Thumb Radial abd/add (0-55)     Thumb Palmar abd/add (0-45)     Thumb Opposition to Small Finger     Index MCP (0-90)     Index PIP (0-100)     Index DIP (0-70)      Long MCP (0-90)      Long PIP (0-100)      Long DIP (0-70)      Ring MCP (0-90)      Ring PIP (0-100)      Ring DIP (0-70)      Little MCP (0-90)      Little PIP (0-100)      Little DIP (0-70)      (Blank rows = not tested)  R hand unable to make fist (3rd digit 4.5cm from [facility])   HAND FUNCTION: Grip strength: Right: 15 lbs with pain; Left: 50 lbs, Lateral pinch: Right: 6 lbs, Left: 8 lbs, and 3 point pinch: Right: 6 lbs, Left: 8 lbs  COORDINATION: 9 Hole Peg test: Right: 1 min 38 sec; Left: 42 sec  SENSATION: Light touch: Impaired  R 1-3rd digits numb, worse at fingertips.   08/31/24: Touch Test:  6.65 all digits, palm and wrist L and R hand 4.56 numb to R 1-3rd digits fingertips only, numb to L thumb tip 4.31 numb to L index 3.61 numb to all digits, palm, and wrist L hand  EDEMA: none  COGNITION: Overall cognitive status: Within functional limits for tasks assessed    TREATMENT DATE: 08/31/24  Contrast bath 8 bath to address swelling and pain at R hand.  Red foam roller for edema massage after contrast bath - plan to complete at home Reviewed exercises - video by caregiver to review with family.  Educated on plan to wear brace at night time, pt denies waking to numbness Joint protection strategies reviewed - issued red foam grip for utensils to address difficulty cutting meat.  Educated on joint protection strategies: - emphasis on avoiding squeezing fist and use of built up grips - use of larger joints to pick items up to minimize repetitive pressure on carpal tunnel       HEP: 2 sets daily x 10 reps each -Wrist/extension seated over arm rest - educated on avoiding wrist flexion -Prayer stretch -Digit opposition -Tendon gliding MCP flexion, hook, and flat fist      PATIENT EDUCATION: Education details: findings of eval and HEP  Person educated: Patient Education method: Explanation, Demonstration, Tactile cues, Verbal cues, and Handouts Education comprehension: verbalized understanding, returned demonstration, verbal cues required, and needs further education   SHORT TERM GOALS: Target date: 09/20/25  Pt to trial prefab wrist splint to help improve pain and improve overall comfort for functional use of RUE.  Baseline: issued at eval Goal status: INITIAL   LONG TERM GOALS: Target date: 10/28/24   Patient will complete BUE HEP with MIN verbal cues or less from family for proper execution. Baseline: New to outpt OT Goal status: IN Progress - Tendon Gliding Exercises issued at eval.       2.  Pt will independently recall at least 3 joint protection, ergonomics, and body mechanic principles as noted in pt instructions to assist with daily tasks with increased comfort and confidence.  Baseline:  New to outpt OT Goal status: INITIAL   3.  Patient will demonstrate at least 15 lb improvement in non-dominant RUE grip strength as needed to open  jars and other containers. Baseline: Right: 15 lbs Left: 50 lbs Goal status: INITIAL   4.  Pt will improve coordination skills in nondominant R hand, as seen by within functional limit score on 9 hole peg testing to have increased functional ability to carry out fine motor tasks (fasteners, etc.) and more complex, coordinated IADLs (meal prep, sports, etc.).  Baseline: Right: 1 min 38 sec; Left: 42 sec.   Goal status: INITIAL     ASSESSMENT:  CLINICAL IMPRESSION: Pt arrives with caregiver who requested to video parts of session for spouse at home and to support baseline cognitive deficits. Pt reports has not been wearing brace at nighttime, denies pain at night, rec to trial wearing. Reviewed HEP and education on condition management including joint protection strategies - provided built up foam grip for utensil use at home. Contrast bath for edema mgmt and red foam roller. Sensation testing demonstrated significant numbness in non-dominant R hand 1-rd digits. May plan to trial [facility] wrist brace next session to address daytime numbness/pain>night time. Pt will benefit from OT services for RUE strengthening, FMC, and activity tolerance in order to improve engagement during basic ADL tasks.     PERFORMANCE DEFICITS: in functional skills including ADLs, IADLs, ROM, strength, pain, flexibility, decreased knowledge of use of DME, and UE functional use,   and psychosocial skills including environmental adaptation and routines and behaviors.   IMPAIRMENTS: are limiting patient from ADLs, IADLs, rest and sleep, play, leisure, and social participation.   COMORBIDITIES: has no other co-morbidities that affects occupational performance. Patient will benefit from skilled OT  to address above impairments and improve overall function.  MODIFICATION OR ASSISTANCE TO COMPLETE EVALUATION: No modification of tasks or assist necessary to complete an evaluation.  OT OCCUPATIONAL PROFILE AND HISTORY: Problem focused  assessment: Including review of records relating to presenting problem.  CLINICAL DECISION MAKING: LOW - limited treatment options, no task modification necessary  REHAB POTENTIAL: Good for goals  EVALUATION COMPLEXITY: Low   PLAN:  OT FREQUENCY: 1-2x/week  OT DURATION: 8 weeks  PLANNED INTERVENTIONS: 97168 OT Re-evaluation, 97535 self care/ADL training, 02889 therapeutic exercise, 97530 therapeutic activity, 97112 neuromuscular re-education, 97140 manual therapy, 97035 ultrasound, 97018 paraffin, 02960 fluidotherapy, 97034 contrast bath, 97033 iontophoresis, passive range of motion, patient/family education, and DME and/or AE instructions    CONSULTED AND AGREED WITH PLAN OF CARE: Patient     Elston JINNY Slot, OTR/L 08/31/2024, 11:11 AM

## 2024-09-04 ENCOUNTER — Ambulatory Visit: Admitting: Occupational Therapy

## 2024-09-04 ENCOUNTER — Ambulatory Visit: Payer: Self-pay | Admitting: Occupational Therapy

## 2024-09-04 DIAGNOSIS — M6281 Muscle weakness (generalized): Secondary | ICD-10-CM

## 2024-09-04 DIAGNOSIS — G5603 Carpal tunnel syndrome, bilateral upper limbs: Secondary | ICD-10-CM

## 2024-09-04 NOTE — Therapy (Addendum)
 OUTPATIENT OCCUPATIONAL THERAPY ORTHO TREATMENT  Patient Name: Jose Ray MRN: [id number] DOB:08/23/1941, 83 y.o., male Today's Date: 09/04/2024  PCP: Dr Alla REFERRING PROVIDER: Dr Ezra  END OF SESSION:  OT End of Session - 09/04/24 1526     Visit Number 3    Number of Visits 12    Date for Recertification  10/18/24    OT Start Time 1526    OT Stop Time 1620    OT Time Calculation (min) 54 min    Activity Tolerance Patient tolerated treatment well    Behavior During Therapy WFL for tasks assessed/performed          Past Medical History:  Diagnosis Date   Acute MI (HCC)    CHF (congestive heart failure) (HCC)    COPD (chronic obstructive pulmonary disease) (HCC)    Coronary artery disease    Diabetes mellitus without complication (HCC)    GERD (gastroesophageal reflux disease)    History of hiatal hernia    Hypertension    Past Surgical History:  Procedure Laterality Date   CORONARY ANGIOPLASTY WITH STENT PLACEMENT     CYSTOSCOPY WITH INSERTION OF UROLIFT N/A 08/08/2018   Procedure: CYSTOSCOPY WITH INSERTION OF UROLIFT;  Surgeon: Kassie Ozell SAUNDERS, MD;  Location: [facility];  Service: Urology;  Laterality: N/A;   HERNIA REPAIR N/A    LEFT HEART CATH AND CORONARY ANGIOGRAPHY Left 02/03/2021   Procedure: LEFT HEART CATH AND CORONARY ANGIOGRAPHY;  Surgeon: Hester Wolm PARAS, MD;  Location: [facility];  Service: Cardiovascular;  Laterality: Left;   Patient Active Problem List   Diagnosis Date Noted   PAD (peripheral artery disease) 02/10/2023   COPD (chronic obstructive pulmonary disease) (HCC) 02/10/2023   Syncope and collapse 10/24/2016   Diabetes (HCC) 10/24/2016   CAD (coronary artery disease) 10/24/2016   HTN (hypertension) 10/24/2016    ONSET DATE: Jul 25 2024  REFERRING DIAG: bilateral CT and trigger fingers  THERAPY DIAG:  Carpal tunnel syndrome, bilateral upper limbs  Muscle weakness (generalized)  Rationale for Evaluation and  Treatment: Rehabilitation  SUBJECTIVE:   SUBJECTIVE STATEMENT: Arrive with increase numbness today in both hands - thumbs thru ring - pinkie best and ring finger- increase symptoms after getting up in the am - make coffee and eat breakfast - during day read  some , watch tv and sit on porch in rocking chair Pt accompanied by: self and caregiver  PERTINENT HISTORY: Ortho noteAssessment & Plan:  Bilateral carpal tunnel syndrome - His recent nerve study was positive for bilateral carpal tunnel syndrome addition to a generalized peripheral neuropathy picture - He seemed to have a positive response to the left-sided carpal tunnel injection, even though he continues to complain of left-sided hand pain - We will try a right sided carpal tunnel injection today to see if he feels like this gives him the same degree of relief - He seems relatively reluctant to undergo any type of surgical intervention but may be reasonable to consider doing carpal tunnel release that should he also have a positive response to this injection  Left middle finger, index finger trigger finger - These seem to be resolved without any significant A1 pulley tenderness and no triggering on exam - Sometimes trigger fingers can be a cause of generalized/vague finger pain, however, he has no other symptoms that would suggest this is the case - I am going to put a referral into occupational therapy to work with the patient on range of motion of  his digits, strengthening, pain modalities - We will continue to monitor and administer other injections if necessary  He can follow-up with me again in 6 to 8 weeks   PRECAUTIONS: None    WEIGHT BEARING RESTRICTIONS: No  PAIN:  Are you having pain?more numbness with some burning pain in hands - soreness with PROM of R 2nd digit flexion   FALLS: Has patient fallen in last 6 months? No  LIVING ENVIRONMENT: Lives with: lives with their spouse; 24/7 home health aid for cooking and  cleaning (IADLs)   PLOF: Independent ADLs, assist for IADLs  PATIENT GOALS: decrease numbness/pain  NEXT MD VISIT: 11/19 pain clinic    OBJECTIVE:  Note: Objective measures were completed at Evaluation unless otherwise noted.  HAND DOMINANCE: Left  ADLs: Feeding self IND, increased time for buttons (avoids shirt buttons).   FUNCTIONAL OUTCOME MEASURES: 08/31/24: [place] Carpal Tunnel Syndrome Questionnaire: 25/55 (hand pain at daytime>nighttime)  UPPER EXTREMITY ROM:     Active ROM Right eval Left eval  Shoulder flexion    Shoulder abduction    Shoulder adduction    Shoulder extension    Shoulder internal rotation    Shoulder external rotation    Elbow flexion    Elbow extension    Wrist flexion 60 70  Wrist extension 50 55  Wrist ulnar deviation    Wrist radial deviation    Wrist pronation    Wrist supination    (Blank rows = not tested)  Active ROM Right eval Left eval  Thumb MCP (0-60)    Thumb IP (0-80)    Thumb Radial abd/add (0-55)     Thumb Palmar abd/add (0-45)     Thumb Opposition to Small Finger     Index MCP (0-90)     Index PIP (0-100)     Index DIP (0-70)      Long MCP (0-90)      Long PIP (0-100)      Long DIP (0-70)      Ring MCP (0-90)      Ring PIP (0-100)      Ring DIP (0-70)      Little MCP (0-90)      Little PIP (0-100)      Little DIP (0-70)      (Blank rows = not tested)  R hand unable to make fist (3rd digit 4.5cm from [facility])   HAND FUNCTION: Eval Grip strength: Right: 15 lbs with pain; Left: 50 lbs, Lateral pinch: Right: 6 lbs, Left: 8 lbs, and 3 point pinch: Right: 6 lbs, Left: 8 lbs  COORDINATION: 9 Hole Peg test: Right: 1 min 38 sec; Left: 42 sec  SENSATION: Light touch: Impaired  R 1-3rd digits numb, worse at fingertips.   08/31/24: Touch Test:  6.65 all digits, palm and wrist L and R hand 4.56 numb to R 1-3rd digits fingertips only, numb to L thumb tip 4.31 numb to L index 3.61 numb to all digits, palm, and wrist  L hand  EDEMA: none  COGNITION: Overall cognitive status: Within functional limits for tasks assessed    TREATMENT DATE: 09/04/24  Long discussion with pt and caregiver about daily activities - wearing of splints and use of hands  Symptoms of CT and treatment   Contrast bath 8 min to bilateral hands and wrist to address swelling and pain in bilateral CT To be done 2-3 x day at home Red foam roller for edema massage  palms only - not volar wrist  Followed by CT spreads by care giver 10 reps   Review HEP and soft tissue mobs with her  Gentle wrist ext PROM 10 reps PROM composite flexion of digits- pt with increase stiffness in R hand more than L  10 reps ed caregiver on doing   video by caregiver to review with family.  Educated  and change HEP for pt to wear  prefab thumb spica and wrist splints night and daytime  Off 3 x day to do HEP for contrast , soft tissue and ROM   Joint protection strategies reviewed - issued red foam grip for utensils  last visit but info provided on [employer] that are enlarge the grips Educated on joint protection strategies: - emphasis on avoiding squeezing fist and enlarge grips -avoid bending of wrist when sitting  - and use palms or forearm to pick up or carry  items up to minimize repetitive pressure on carpal tunnel            PATIENT EDUCATION: Education details: findings of eval and HEP  Person educated: Patient Education method: Explanation, Demonstration, Tactile cues, Verbal cues, and Handouts Education comprehension: verbalized understanding, returned demonstration, verbal cues required, and needs further education   SHORT TERM GOALS: Target date: 09/20/25  Pt to trial prefab wrist splint to help improve pain and improve overall comfort for functional use of RUE.  Baseline: issued at eval Goal status:  INITIAL   LONG TERM GOALS: Target date: 10/28/24   Patient will complete BUE HEP with MIN verbal cues or less from family for proper execution. Baseline: New to outpt OT Goal status: IN Progress - Tendon Gliding Exercises issued at eval.       2.  Pt will independently recall at least 3 joint protection, ergonomics, and body mechanic principles as noted in pt instructions to assist with daily tasks with increased comfort and confidence.  Baseline:  New to outpt OT Goal status: INITIAL   3.  Patient will demonstrate at least 15 lb improvement in non-dominant RUE grip strength as needed to open jars and other containers. Baseline: Right: 15 lbs Left: 50 lbs Goal status: INITIAL   4.  Pt will improve coordination skills in nondominant R hand, as seen by within functional limit score on 9 hole peg testing to have increased functional ability to carry out fine motor tasks (fasteners, etc.) and more complex, coordinated IADLs (meal prep, sports, etc.).  Baseline: Right: 1 min 38 sec; Left: 42 sec.   Goal status: INITIAL     ASSESSMENT:  CLINICAL IMPRESSION: Pt arrives with caregiver who requested to video parts of session for spouse at home and to support baseline cognitive deficits. Reports has been wearing braces at nighttime,  and appear thumb spica splints- appear symptoms increase after getting up and using hands- pt to wear daytime too - for 3 wks - to decrease flexion of wrist during day and tight grip or thumb collapsing into palm - HEP 3 x day contrast , soft tissue and PROM for wrist ext and composite flexion of digits with wrist neutral -  education on condition management including  joint protection strategies -  to order enlarge grip utensils and using larger joints to carry or lift and avoid flexion of wrist. Pt will benefit from OT services for RUE strengthening, FMC, and activity tolerance in order to improve engagement during basic ADL tasks.     PERFORMANCE DEFICITS: in  functional skills including ADLs, IADLs, ROM, strength, pain, flexibility, decreased knowledge of use of DME, and UE functional use,   and psychosocial skills including environmental adaptation and routines and behaviors.   IMPAIRMENTS: are limiting patient from ADLs, IADLs, rest and sleep, play, leisure, and social participation.   COMORBIDITIES: has no other co-morbidities that affects occupational performance. Patient will benefit from skilled OT to address above impairments and improve overall function.  MODIFICATION OR ASSISTANCE TO COMPLETE EVALUATION: No modification of tasks or assist necessary to complete an evaluation.  OT OCCUPATIONAL PROFILE AND HISTORY: Problem focused assessment: Including review of records relating to presenting problem.  CLINICAL DECISION MAKING: LOW - limited treatment options, no task modification necessary  REHAB POTENTIAL: Good for goals  EVALUATION COMPLEXITY: Low   PLAN:  OT FREQUENCY: 1-2x/week  OT DURATION: 8 weeks  PLANNED INTERVENTIONS: 97168 OT Re-evaluation, 97535 self care/ADL training, 02889 therapeutic exercise, 97530 therapeutic activity, 97112 neuromuscular re-education, 97140 manual therapy, 97035 ultrasound, 97018 paraffin, 02960 fluidotherapy, 97034 contrast bath, 97033 iontophoresis, passive range of motion, patient/family education, and DME and/or AE instructions    CONSULTED AND AGREED WITH PLAN OF CARE: Patient     Ancel Peters, OTR/L,CLT 09/04/2024, 4:32 PM

## 2024-09-05 ENCOUNTER — Ambulatory Visit

## 2024-09-12 ENCOUNTER — Inpatient Hospital Stay
Admission: RE | Admit: 2024-09-12 | Discharge: 2024-09-12 | Disposition: A | Source: Ambulatory Visit | Attending: Family Medicine | Admitting: Family Medicine

## 2024-09-12 DIAGNOSIS — M5412 Radiculopathy, cervical region: Secondary | ICD-10-CM

## 2024-09-14 ENCOUNTER — Ambulatory Visit: Admitting: Occupational Therapy

## 2024-11-07 ENCOUNTER — Emergency Department

## 2024-11-07 ENCOUNTER — Other Ambulatory Visit: Payer: Self-pay

## 2024-11-07 ENCOUNTER — Emergency Department
Admission: EM | Admit: 2024-11-07 | Discharge: 2024-11-07 | Discharge status: Against Medical Advice | Attending: Emergency Medicine | Admitting: Emergency Medicine

## 2024-11-07 ENCOUNTER — Encounter: Payer: Self-pay | Admitting: *Deleted

## 2024-11-07 DIAGNOSIS — F0390 Unspecified dementia without behavioral disturbance: Secondary | ICD-10-CM | POA: Diagnosis not present

## 2024-11-07 DIAGNOSIS — Z7901 Long term (current) use of anticoagulants: Secondary | ICD-10-CM | POA: Insufficient documentation

## 2024-11-07 DIAGNOSIS — Y92009 Unspecified place in unspecified non-institutional (private) residence as the place of occurrence of the external cause: Secondary | ICD-10-CM | POA: Diagnosis not present

## 2024-11-07 DIAGNOSIS — Z5321 Procedure and treatment not carried out due to patient leaving prior to being seen by health care provider: Secondary | ICD-10-CM | POA: Diagnosis not present

## 2024-11-07 DIAGNOSIS — Z043 Encounter for examination and observation following other accident: Secondary | ICD-10-CM | POA: Diagnosis present

## 2024-11-07 NOTE — ED Triage Notes (Addendum)
 Pt to triage via wheelchair.  Pt has dementia.  Pt lives at home and went outside and fell twice today on the ice and hit head.  No loc  no n/v.  No neck or back pain.  Pt is on blood thinners.   Pt alert   pt on 3 liters oxygen Jose Ray at home.
